# Patient Record
Sex: Female | Born: 1970 | Hispanic: No | Marital: Single | Smoking: Former smoker
Health system: Southern US, Community
[De-identification: ages and names within clinical notes are randomized; demographics above are authoritative.]

## PROBLEM LIST (undated history)

## (undated) DIAGNOSIS — M545 Low back pain, unspecified: Secondary | ICD-10-CM

## (undated) DIAGNOSIS — M47816 Spondylosis without myelopathy or radiculopathy, lumbar region: Secondary | ICD-10-CM

## (undated) DIAGNOSIS — F419 Anxiety disorder, unspecified: Secondary | ICD-10-CM

## (undated) DIAGNOSIS — M797 Fibromyalgia: Secondary | ICD-10-CM

## (undated) DIAGNOSIS — I5189 Other ill-defined heart diseases: Secondary | ICD-10-CM

## (undated) DIAGNOSIS — E559 Vitamin D deficiency, unspecified: Secondary | ICD-10-CM

## (undated) DIAGNOSIS — F513 Sleepwalking [somnambulism]: Secondary | ICD-10-CM

## (undated) DIAGNOSIS — N83209 Unspecified ovarian cyst, unspecified side: Secondary | ICD-10-CM

## (undated) DIAGNOSIS — D649 Anemia, unspecified: Secondary | ICD-10-CM

## (undated) DIAGNOSIS — F1911 Other psychoactive substance abuse, in remission: Secondary | ICD-10-CM

## (undated) DIAGNOSIS — I2510 Atherosclerotic heart disease of native coronary artery without angina pectoris: Secondary | ICD-10-CM

## (undated) DIAGNOSIS — G5600 Carpal tunnel syndrome, unspecified upper limb: Secondary | ICD-10-CM

## (undated) DIAGNOSIS — F329 Major depressive disorder, single episode, unspecified: Secondary | ICD-10-CM

## (undated) DIAGNOSIS — E782 Mixed hyperlipidemia: Secondary | ICD-10-CM

## (undated) DIAGNOSIS — M47812 Spondylosis without myelopathy or radiculopathy, cervical region: Secondary | ICD-10-CM

## (undated) DIAGNOSIS — G894 Chronic pain syndrome: Secondary | ICD-10-CM

## (undated) DIAGNOSIS — I10 Essential (primary) hypertension: Secondary | ICD-10-CM

## (undated) DIAGNOSIS — Z91410 Personal history of adult physical and sexual abuse: Secondary | ICD-10-CM

## (undated) DIAGNOSIS — Z87898 Personal history of other specified conditions: Secondary | ICD-10-CM

## (undated) DIAGNOSIS — F4310 Post-traumatic stress disorder, unspecified: Secondary | ICD-10-CM

## (undated) DIAGNOSIS — F32A Depression, unspecified: Secondary | ICD-10-CM

## (undated) DIAGNOSIS — G8929 Other chronic pain: Secondary | ICD-10-CM

## (undated) DIAGNOSIS — R7301 Impaired fasting glucose: Secondary | ICD-10-CM

## (undated) DIAGNOSIS — Z720 Tobacco use: Secondary | ICD-10-CM

## (undated) HISTORY — DX: Post-traumatic stress disorder, unspecified: F43.10

## (undated) HISTORY — PX: ABDOMINAL HYSTERECTOMY: SHX81

## (undated) HISTORY — DX: Low back pain, unspecified: M54.50

## (undated) HISTORY — DX: Mixed hyperlipidemia: E78.2

## (undated) HISTORY — DX: Carpal tunnel syndrome, unspecified upper limb: G56.00

## (undated) HISTORY — PX: MOUTH SURGERY: SHX715

## (undated) HISTORY — DX: Anemia, unspecified: D64.9

## (undated) HISTORY — DX: Vitamin D deficiency, unspecified: E55.9

## (undated) HISTORY — DX: Unspecified ovarian cyst, unspecified side: N83.209

## (undated) HISTORY — PX: CORONARY ANGIOPLASTY: SHX604

## (undated) HISTORY — DX: Personal history of adult physical and sexual abuse: Z91.410

## (undated) HISTORY — DX: Depression, unspecified: F32.A

## (undated) HISTORY — DX: Spondylosis without myelopathy or radiculopathy, lumbar region: M47.816

## (undated) HISTORY — DX: Low back pain: M54.5

## (undated) HISTORY — DX: Personal history of other specified conditions: Z87.898

## (undated) HISTORY — DX: Sleepwalking (somnambulism): F51.3

## (undated) HISTORY — DX: Spondylosis without myelopathy or radiculopathy, cervical region: M47.812

## (undated) HISTORY — DX: Other psychoactive substance abuse, in remission: F19.11

## (undated) HISTORY — DX: Impaired fasting glucose: R73.01

## (undated) HISTORY — DX: Anxiety disorder, unspecified: F41.9

## (undated) HISTORY — DX: Other chronic pain: G89.29

## (undated) HISTORY — DX: Chronic pain syndrome: G89.4

## (undated) HISTORY — DX: Major depressive disorder, single episode, unspecified: F32.9

---

## 2005-04-14 ENCOUNTER — Emergency Department (HOSPITAL_COMMUNITY): Admission: EM | Admit: 2005-04-14 | Discharge: 2005-04-15 | Payer: Self-pay | Admitting: Emergency Medicine

## 2005-11-20 ENCOUNTER — Emergency Department: Payer: Self-pay | Admitting: Internal Medicine

## 2009-08-23 ENCOUNTER — Emergency Department: Payer: Self-pay | Admitting: Emergency Medicine

## 2010-02-15 ENCOUNTER — Ambulatory Visit: Payer: Self-pay | Admitting: Unknown Physician Specialty

## 2010-03-09 ENCOUNTER — Ambulatory Visit: Payer: Self-pay | Admitting: Unknown Physician Specialty

## 2010-03-14 ENCOUNTER — Ambulatory Visit: Payer: Self-pay | Admitting: Unknown Physician Specialty

## 2011-12-03 DIAGNOSIS — T7840XA Allergy, unspecified, initial encounter: Secondary | ICD-10-CM

## 2011-12-03 HISTORY — DX: Allergy, unspecified, initial encounter: T78.40XA

## 2012-03-24 ENCOUNTER — Emergency Department: Payer: Self-pay | Admitting: Emergency Medicine

## 2012-03-24 LAB — COMPREHENSIVE METABOLIC PANEL WITH GFR
Albumin: 4.2 g/dL
Alkaline Phosphatase: 79 U/L
Anion Gap: 8
BUN: 19 mg/dL — ABNORMAL HIGH
Bilirubin,Total: 0.4 mg/dL
Calcium, Total: 8.9 mg/dL
Chloride: 106 mmol/L
Co2: 27 mmol/L
Creatinine: 0.88 mg/dL
EGFR (African American): >60
EGFR (Non-African Amer.): >60
Glucose: 79 mg/dL
Osmolality: 282
Potassium: 3.6 mmol/L
SGOT(AST): 26 U/L
SGPT (ALT): 29 U/L
Sodium: 141 mmol/L
Total Protein: 7.9 g/dL

## 2012-03-24 LAB — CBC
HCT: 40.9 %
HGB: 13.4 g/dL
MCH: 31.2 pg
MCHC: 32.7 g/dL
MCV: 95 fL
Platelet: 154 x10 3/mm 3
RBC: 4.29 X10 6/mm 3
RDW: 14.5 %
WBC: 5.9 x10 3/mm 3

## 2012-03-24 LAB — ETHANOL
Ethanol %: <0.003 %
Ethanol: <3 mg/dL

## 2012-03-25 LAB — HCG, QUANTITATIVE, PREGNANCY: Beta Hcg, Quant.: <1 m[IU]/mL — ABNORMAL LOW

## 2013-02-18 ENCOUNTER — Ambulatory Visit: Payer: Self-pay

## 2013-07-28 ENCOUNTER — Emergency Department: Payer: Self-pay | Admitting: Emergency Medicine

## 2013-08-17 ENCOUNTER — Emergency Department: Payer: Self-pay | Admitting: Internal Medicine

## 2013-08-17 LAB — COMPREHENSIVE METABOLIC PANEL
Alkaline Phosphatase: 111 U/L (ref 50–136)
Anion Gap: 5 — ABNORMAL LOW (ref 7–16)
Bilirubin,Total: 0.3 mg/dL (ref 0.2–1.0)
Co2: 29 mmol/L (ref 21–32)
Creatinine: 0.73 mg/dL (ref 0.60–1.30)
EGFR (African American): >60
Glucose: 95 mg/dL (ref 65–99)
Osmolality: 277 (ref 275–301)
Total Protein: 7.6 g/dL (ref 6.4–8.2)

## 2013-08-17 LAB — CBC WITH DIFFERENTIAL/PLATELET
Basophil #: 0.1 10*3/uL (ref 0.0–0.1)
Basophil %: 1.2 %
MCV: 90 fL (ref 80–100)
Monocyte %: 7.4 %
Neutrophil #: 3.6 10*3/uL (ref 1.4–6.5)
Neutrophil %: 58.2 %
Platelet: 272 10*3/uL (ref 150–440)
WBC: 6.1 10*3/uL (ref 3.6–11.0)

## 2013-08-17 LAB — SEDIMENTATION RATE: Erythrocyte Sed Rate: 28 mm/hr — ABNORMAL HIGH (ref 0–20)

## 2013-11-18 ENCOUNTER — Ambulatory Visit: Payer: Self-pay

## 2014-02-17 ENCOUNTER — Emergency Department: Payer: Self-pay | Admitting: Internal Medicine

## 2014-02-17 LAB — COMPREHENSIVE METABOLIC PANEL
ALK PHOS: 94 U/L
ANION GAP: 3 — AB (ref 7–16)
Albumin: 4.2 g/dL (ref 3.4–5.0)
BUN: 11 mg/dL (ref 7–18)
Bilirubin,Total: 0.5 mg/dL (ref 0.2–1.0)
CHLORIDE: 105 mmol/L (ref 98–107)
CREATININE: 0.78 mg/dL (ref 0.60–1.30)
Calcium, Total: 8.9 mg/dL (ref 8.5–10.1)
Co2: 27 mmol/L (ref 21–32)
Glucose: 84 mg/dL (ref 65–99)
Osmolality: 269 (ref 275–301)
Potassium: 3.8 mmol/L (ref 3.5–5.1)
SGOT(AST): 23 U/L (ref 15–37)
SGPT (ALT): 20 U/L (ref 12–78)
Sodium: 135 mmol/L — ABNORMAL LOW (ref 136–145)
Total Protein: 8.8 g/dL — ABNORMAL HIGH (ref 6.4–8.2)

## 2014-02-17 LAB — CBC WITH DIFFERENTIAL/PLATELET
BASOS PCT: 1.2 %
Basophil #: 0.1 10*3/uL (ref 0.0–0.1)
EOS ABS: 0.1 10*3/uL (ref 0.0–0.7)
EOS PCT: 1.1 %
HCT: 40.2 % (ref 35.0–47.0)
HGB: 13.4 g/dL (ref 12.0–16.0)
Lymphocyte #: 2.2 10*3/uL (ref 1.0–3.6)
Lymphocyte %: 32.6 %
MCH: 30.9 pg (ref 26.0–34.0)
MCHC: 33.2 g/dL (ref 32.0–36.0)
MCV: 93 fL (ref 80–100)
MONO ABS: 0.7 x10 3/mm (ref 0.2–0.9)
MONOS PCT: 10.6 %
NEUTROS ABS: 3.6 10*3/uL (ref 1.4–6.5)
NEUTROS PCT: 54.5 %
Platelet: 225 10*3/uL (ref 150–440)
RBC: 4.32 10*6/uL (ref 3.80–5.20)
RDW: 14.8 % — ABNORMAL HIGH (ref 11.5–14.5)
WBC: 6.6 10*3/uL (ref 3.6–11.0)

## 2014-02-17 LAB — SEDIMENTATION RATE: ERYTHROCYTE SED RATE: 21 mm/h — AB (ref 0–20)

## 2014-06-23 ENCOUNTER — Ambulatory Visit: Payer: Self-pay | Admitting: Family Medicine

## 2014-06-23 ENCOUNTER — Emergency Department: Payer: Self-pay | Admitting: Emergency Medicine

## 2014-12-17 ENCOUNTER — Emergency Department: Payer: Self-pay | Admitting: Physician Assistant

## 2015-05-31 ENCOUNTER — Other Ambulatory Visit: Payer: Self-pay

## 2015-05-31 ENCOUNTER — Emergency Department
Admission: EM | Admit: 2015-05-31 | Discharge: 2015-05-31 | Disposition: A | Discharge status: Home / Self Care | Payer: Self-pay | Attending: Emergency Medicine | Admitting: Emergency Medicine

## 2015-05-31 ENCOUNTER — Encounter: Payer: Self-pay | Admitting: Emergency Medicine

## 2015-05-31 DIAGNOSIS — G8929 Other chronic pain: Secondary | ICD-10-CM | POA: Insufficient documentation

## 2015-05-31 DIAGNOSIS — M7918 Myalgia, other site: Secondary | ICD-10-CM

## 2015-05-31 DIAGNOSIS — Y92410 Unspecified street and highway as the place of occurrence of the external cause: Secondary | ICD-10-CM | POA: Insufficient documentation

## 2015-05-31 DIAGNOSIS — Z720 Tobacco use: Secondary | ICD-10-CM | POA: Insufficient documentation

## 2015-05-31 DIAGNOSIS — Y9389 Activity, other specified: Secondary | ICD-10-CM | POA: Insufficient documentation

## 2015-05-31 DIAGNOSIS — Y998 Other external cause status: Secondary | ICD-10-CM | POA: Insufficient documentation

## 2015-05-31 DIAGNOSIS — S3992XA Unspecified injury of lower back, initial encounter: Secondary | ICD-10-CM | POA: Insufficient documentation

## 2015-05-31 DIAGNOSIS — Z79899 Other long term (current) drug therapy: Secondary | ICD-10-CM | POA: Insufficient documentation

## 2015-05-31 DIAGNOSIS — I10 Essential (primary) hypertension: Secondary | ICD-10-CM | POA: Insufficient documentation

## 2015-05-31 DIAGNOSIS — S199XXA Unspecified injury of neck, initial encounter: Secondary | ICD-10-CM | POA: Insufficient documentation

## 2015-05-31 HISTORY — DX: Fibromyalgia: M79.7

## 2015-05-31 HISTORY — DX: Essential (primary) hypertension: I10

## 2015-05-31 MED ORDER — NAPROXEN 250 MG PO TABS: 250.0000 mg | ORAL_TABLET | Freq: Two times a day (BID) | ORAL | Status: DC

## 2015-05-31 MED ORDER — OXYCODONE-ACETAMINOPHEN 5-325 MG PO TABS: 1.0000 | ORAL_TABLET | Freq: Four times a day (QID) | ORAL | Status: DC | PRN

## 2015-05-31 MED ORDER — DIAZEPAM 5 MG PO TABS: 5.0000 mg | ORAL_TABLET | Freq: Three times a day (TID) | ORAL | Status: DC | PRN
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
## 2015-05-31 NOTE — Discharge Instructions (Signed)
Fibromyalgia Fibromyalgia is a disorder that is often misunderstood. It is associated with muscular pains and tenderness that comes and goes. It is often associated with fatigue and sleep disturbances. Though it tends to be long-lasting, fibromyalgia is not life-threatening. CAUSES  The exact cause of fibromyalgia is unknown. People with certain gene types are predisposed to developing fibromyalgia and other conditions. Certain factors can play a role as triggers, such as:  Spine disorders.  Arthritis.  Severe injury (trauma) and other physical stressors.  Emotional stressors. SYMPTOMS   The main symptom is pain and stiffness in the muscles and joints, which can vary over time.  Sleep and fatigue problems. Other related symptoms may include:  Bowel and bladder problems.  Headaches.  Visual problems.  Problems with odors and noises.  Depression or mood changes.  Painful periods (dysmenorrhea).  Dryness of the skin or eyes. DIAGNOSIS  There are no specific tests for diagnosing fibromyalgia. Patients can be diagnosed accurately from the specific symptoms they have. The diagnosis is made by determining that nothing else is causing the problems. TREATMENT  There is no cure. Management includes medicines and an active, healthy lifestyle. The goal is to enhance physical fitness, decrease pain, and improve sleep. HOME CARE INSTRUCTIONS   Only take over-the-counter or prescription medicines as directed by your caregiver. Sleeping pills, tranquilizers, and pain medicines may make your problems worse.  Low-impact aerobic exercise is very important and advised for treatment. At first, it may seem to make pain worse. Gradually increasing your tolerance will overcome this feeling.  Learning relaxation techniques and how to control stress will help you. Biofeedback, visual imagery, hypnosis, muscle relaxation, yoga, and meditation are all options.  Anti-inflammatory medicines and  physical therapy may provide short-term help.  Acupuncture or massage treatments may help.  Take muscle relaxant medicines as suggested by your caregiver.  Avoid stressful situations.  Plan a healthy lifestyle. This includes your diet, sleep, rest, exercise, and friends.  Find and practice a hobby you enjoy.  Join a fibromyalgia support group for interaction, ideas, and sharing advice. This may be helpful. SEEK MEDICAL CARE IF:  You are not having good results or improvement from your treatment. FOR MORE INFORMATION  National Fibromyalgia Association: www.fmaware.org Arthritis Foundation: www.arthritis.org Document Released: 11/18/2005 Document Revised: 02/10/2012 Document Reviewed: 02/28/2010 [facility] Patient Information 2015 [place], [place]. This information is not intended to replace advice given to you by your health care provider. Make sure you discuss any questions you have with your health care provider.  Muscle Pain Muscle pain (myalgia) may be caused by many things, including:  Overuse or muscle strain, especially if you are not in shape. This is the most common cause of muscle pain.  Injury.  Bruises.  Viruses, such as the flu.  Infectious diseases.  Fibromyalgia, which is a chronic condition that causes muscle tenderness, fatigue, and headache.  Autoimmune diseases, including lupus.  Certain drugs, including ACE inhibitors and statins. Muscle pain may be mild or severe. In most cases, the pain lasts only a short time and goes away without treatment. To diagnose the cause of your muscle pain, your health care provider will take your medical history. This means he or she will ask you when your muscle pain began and what has been happening. If you have not had muscle pain for very long, your health care provider may want to wait before doing much testing. If your muscle pain has lasted a long time, your health care provider may want to run  tests right away. If your  health care provider thinks your muscle pain may be caused by illness, you may need to have additional tests to rule out certain conditions.  Treatment for muscle pain depends on the cause. Home care is often enough to relieve muscle pain. Your health care provider may also prescribe anti-inflammatory medicine. HOME CARE INSTRUCTIONS Watch your condition for any changes. The following actions may help to lessen any discomfort you are feeling:  Only take over-the-counter or prescription medicines as directed by your health care provider.  Apply ice to the sore muscle:  Put ice in a plastic bag.  Place a towel between your skin and the bag.  Leave the ice on for 15-20 minutes, 3-4 times a day.  You may alternate applying hot and cold packs to the muscle as directed by your health care provider.  If overuse is causing your muscle pain, slow down your activities until the pain goes away.  Remember that it is normal to feel some muscle pain after starting a workout program. Muscles that have not been used often will be sore at first.  Do regular, gentle exercises if you are not usually active.  Warm up before exercising to lower your risk of muscle pain.  Do not continue working out if the pain is very bad. Bad pain could mean you have injured a muscle. SEEK MEDICAL CARE IF:  Your muscle pain gets worse, and medicines do not help.  You have muscle pain that lasts longer than 3 days.  You have a rash or fever along with muscle pain.  You have muscle pain after a tick bite.  You have muscle pain while working out, even though you are in good physical condition.  You have redness, soreness, or swelling along with muscle pain.  You have muscle pain after starting a new medicine or changing the dose of a medicine. SEEK IMMEDIATE MEDICAL CARE IF:  You have trouble breathing.  You have trouble swallowing.  You have muscle pain along with a stiff neck, fever, and vomiting.  You  have severe muscle weakness or cannot move part of your body. MAKE SURE YOU:   Understand these instructions.  Will watch your condition.  Will get help right away if you are not doing well or get worse. Document Released: 10/10/2006 Document Revised: 11/23/2013 Document Reviewed: 09/14/2013 [facility] Patient Information 2015 [place], [place]. This information is not intended to replace advice given to you by your health care provider. Make sure you discuss any questions you have with your health care provider.   You were prescribed a medication that is potentially sedating. Do not drink alcohol, drive or participate in any other potentially dangerous activities while taking this medication as it may make you sleepy. Do not take this medication with any other sedating medications, either prescription or over-the-counter. If you were prescribed Percocet or Vicodin, do not take these with acetaminophen (Tylenol) as it is already contained within these medications.   Opioid pain medications (or "narcotics") can be habit forming.  Use it as little as possible to achieve adequate pain control.  Do not use or use it with extreme caution if you have a history of opiate abuse or dependence.  If you are on a pain contract with your primary care doctor or a pain specialist, be sure to let them know you were prescribed this medication today from the [facility] Emergency Department.  This medication is intended for your use only - do not  give any to anyone else and keep it in a secure place where nobody else, especially children and pets, have access to it.  It will also cause or worsen constipation, so you may want to consider taking an over-the-counter stool softener while you are taking this medication.

## 2015-05-31 NOTE — ED Notes (Signed)
Pt to ED with c/o weakness all over and numbness and weakness to left arm, states she has had 2 episodes of syncope since Saturday, states she has fibromyalgia and she thinks this has something to do with it, also c/o pain all over her body

## 2015-08-02 ENCOUNTER — Emergency Department
Admission: EM | Admit: 2015-08-02 | Discharge: 2015-08-02 | Disposition: A | Discharge status: Home / Self Care | Payer: Medicaid Other | Attending: Emergency Medicine | Admitting: Emergency Medicine

## 2015-08-02 ENCOUNTER — Encounter: Payer: Self-pay | Admitting: Emergency Medicine

## 2015-08-02 DIAGNOSIS — M549 Dorsalgia, unspecified: Secondary | ICD-10-CM | POA: Diagnosis present

## 2015-08-02 DIAGNOSIS — Z87891 Personal history of nicotine dependence: Secondary | ICD-10-CM | POA: Insufficient documentation

## 2015-08-02 DIAGNOSIS — Z79899 Other long term (current) drug therapy: Secondary | ICD-10-CM | POA: Diagnosis not present

## 2015-08-02 DIAGNOSIS — I10 Essential (primary) hypertension: Secondary | ICD-10-CM | POA: Diagnosis not present

## 2015-08-02 DIAGNOSIS — M797 Fibromyalgia: Secondary | ICD-10-CM

## 2015-08-02 MED ORDER — DIAZEPAM 5 MG/ML IJ SOLN
5.0000 mg | Freq: Once | INTRAMUSCULAR | Status: DC
  Filled 2015-08-02: qty 2

## 2015-08-02 MED ORDER — KETOROLAC TROMETHAMINE 60 MG/2ML IM SOLN
60.0000 mg | Freq: Once | INTRAMUSCULAR | Status: AC
  Administered 2015-08-02: 60 mg via INTRAMUSCULAR
  Filled 2015-08-02: qty 2

## 2015-08-02 MED ORDER — OXYCODONE-ACETAMINOPHEN 5-325 MG PO TABS: 1.0000 | ORAL_TABLET | ORAL | Status: DC | PRN

## 2015-08-02 MED ORDER — DIAZEPAM 5 MG/ML IJ SOLN
5.0000 mg | Freq: Once | INTRAMUSCULAR | Status: AC
  Administered 2015-08-02: 5 mg via INTRAMUSCULAR

## 2015-08-02 NOTE — ED Provider Notes (Signed)
[facility]  Time seen: Approximately 12:37 PM  I have reviewed the triage vital signs and the nursing notes.   HISTORY  Chief Complaint Back Pain and Fibromyalgia    HPI ARLETT GOOLD is a 44 y.o. female with ongoing history of fibromyalgia. Presents today with an acute exacerbation. Patient states she is currently on the Lyrica, for pain.   Past Medical History  Diagnosis Date  . Fibromyalgia   . Hypertension     There are no active problems to display for this patient.   History reviewed. No pertinent past surgical history.  Current Outpatient Rx  Name  Route  Sig  Dispense  Refill  . gabapentin (NEURONTIN) 300 MG capsule   Oral   Take 300 mg by mouth 3 (three) times daily.         . pregabalin (LYRICA) 75 MG capsule   Oral   Take 150 mg by mouth 3 (three) times daily.         Marland Kitchen FLUoxetine (PROZAC) 20 MG capsule   Oral   Take 20 mg by mouth daily.         Marland Kitchen lisinopril-hydrochlorothiazide (PRINZIDE,ZESTORETIC) 10-12.5 MG per tablet   Oral   Take 1 tablet by mouth daily.         Marland Kitchen oxyCODONE-acetaminophen (ROXICET) 5-325 MG per tablet   Oral   Take 1-2 tablets by mouth every 4 (four) hours as needed for severe pain.   15 tablet   0     Allergies Review of patient's allergies indicates no known allergies.  No family history on file.  Social History Social History  Substance Use Topics  . Smoking status: Former Games developer  . Smokeless tobacco: None  . Alcohol Use: No    Review of Systems Constitutional: No fever/chills Eyes: No visual changes. ENT: No sore throat. Cardiovascular: Denies chest pain. Respiratory: Denies shortness of breath. Gastrointestinal: No abdominal pain.  No nausea, no vomiting.  No diarrhea.  No constipation. Genitourinary: Negative for dysuria. Musculoskeletal: Positive for spinal pain and shoulder pain and  hand pain. Skin: Negative for rash. Neurological: Negative for headaches, focal weakness or numbness.  10-point ROS otherwise negative.  ____________________________________________   PHYSICAL EXAM:  VITAL SIGNS: ED Triage Vitals  Enc Vitals Group     BP 08/02/15 1214 113/96 mmHg     Pulse Rate 08/02/15 1214 86     Resp 08/02/15 1214 18     Temp 08/02/15 1214 97.6 F (36.4 C)     Temp Source 08/02/15 1214 Oral     SpO2 08/02/15 1214 95 %     Weight 08/02/15 1214 180 lb (81.647 kg)     Height --      Head Cir --      Peak Flow --      Pain Score 08/02/15 1216 10     Pain Loc --      Pain Edu? --      Excl. in GC? --     Constitutional: Alert and oriented. Well appearing and in no acute distress. Eyes: Conjunctivae are normal. PERRL. EOMI. Head: Atraumatic. Nose: No congestion/rhinnorhea. Mouth/Throat: Mucous membranes are moist.  Oropharynx non-erythematous. Neck: No stridor.  Positive spinal tenderness. Range of motion of the neck with flexion extension. Cardiovascular: Normal rate, regular rhythm. Grossly normal heart sounds.  Good peripheral circulation. Respiratory: Normal respiratory effort.  No retractions. Lungs CTAB. Gastrointestinal: Soft and nontender. No distention. No abdominal  bruits. No CVA tenderness. Musculoskeletal: No lower extremity tenderness nor edema.  No joint effusions. Positive muscular spasms noted on the left shoulder. Neurologic:  Normal speech and language. No gross focal neurologic deficits are appreciated. No gait instability. Skin:  Skin is warm, dry and intact. No rash noted. Psychiatric: Mood and affect are normal. Speech and behavior are normal.  ____________________________________________   LABS (all labs ordered are listed, but only abnormal results are displayed)  Labs Reviewed - No data to display ____________________________________________   PROCEDURES  Procedure(s) performed: None  Critical Care performed:  No  ____________________________________________   INITIAL IMPRESSION / ASSESSMENT AND PLAN / ED COURSE  Pertinent labs & imaging results that were available during my care of the patient were reviewed by me and considered in my medical decision making (see chart for details).  Acute exacerbation of fibromyalgia. 5 mg and Toradol 60 mg IM given while in the ED. Patient encouraged to follow up with her PCP for further intervention. Rx given for 2 day course of oxycodone 5/325. Patient voices some relief while in ED. She offers no other emergency medical complaints at this time. ____________________________________________   FINAL CLINICAL IMPRESSION(S) / ED DIAGNOSES  Final diagnoses:  Muscle pain, fibromyalgia      Evangeline Dakin, PA-C 08/02/15 1302  Sharyn Creamer, MD 08/02/15 [phone]
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
## 2015-08-02 NOTE — Discharge Instructions (Signed)
Fibromyalgia Fibromyalgia is a disorder that is often misunderstood. It is associated with muscular pains and tenderness that comes and goes. It is often associated with fatigue and sleep disturbances. Though it tends to be long-lasting, fibromyalgia is not life-threatening. CAUSES  The exact cause of fibromyalgia is unknown. People with certain gene types are predisposed to developing fibromyalgia and other conditions. Certain factors can play a role as triggers, such as:  Spine disorders.  Arthritis.  Severe injury (trauma) and other physical stressors.  Emotional stressors. SYMPTOMS   The main symptom is pain and stiffness in the muscles and joints, which can vary over time.  Sleep and fatigue problems. Other related symptoms may include:  Bowel and bladder problems.  Headaches.  Visual problems.  Problems with odors and noises.  Depression or mood changes.  Painful periods (dysmenorrhea).  Dryness of the skin or eyes. DIAGNOSIS  There are no specific tests for diagnosing fibromyalgia. Patients can be diagnosed accurately from the specific symptoms they have. The diagnosis is made by determining that nothing else is causing the problems. TREATMENT  There is no cure. Management includes medicines and an active, healthy lifestyle. The goal is to enhance physical fitness, decrease pain, and improve sleep. HOME CARE INSTRUCTIONS   Only take over-the-counter or prescription medicines as directed by your caregiver. Sleeping pills, tranquilizers, and pain medicines may make your problems worse.  Low-impact aerobic exercise is very important and advised for treatment. At first, it may seem to make pain worse. Gradually increasing your tolerance will overcome this feeling.  Learning relaxation techniques and how to control stress will help you. Biofeedback, visual imagery, hypnosis, muscle relaxation, yoga, and meditation are all options.  Anti-inflammatory medicines and  physical therapy may provide short-term help.  Acupuncture or massage treatments may help.  Take muscle relaxant medicines as suggested by your caregiver.  Avoid stressful situations.  Plan a healthy lifestyle. This includes your diet, sleep, rest, exercise, and friends.  Find and practice a hobby you enjoy.  Join a fibromyalgia support group for interaction, ideas, and sharing advice. This may be helpful. SEEK MEDICAL CARE IF:  You are not having good results or improvement from your treatment. FOR MORE INFORMATION  National Fibromyalgia Association: www.fmaware.org Arthritis Foundation: www.arthritis.org Document Released: 11/18/2005 Document Revised: 02/10/2012 Document Reviewed: 02/28/2010 [employer] Patient Information 2015 [employer]. This information is not intended to replace advice given to you by your health care provider. Make sure you discuss any questions you have with your health care provider.  

## 2015-09-15 ENCOUNTER — Emergency Department
Admission: EM | Admit: 2015-09-15 | Discharge: 2015-09-15 | Disposition: A | Discharge status: Home / Self Care | Payer: Medicaid Other | Attending: Emergency Medicine | Admitting: Emergency Medicine

## 2015-09-15 DIAGNOSIS — E860 Dehydration: Secondary | ICD-10-CM | POA: Diagnosis not present

## 2015-09-15 DIAGNOSIS — M62830 Muscle spasm of back: Secondary | ICD-10-CM

## 2015-09-15 DIAGNOSIS — Z87891 Personal history of nicotine dependence: Secondary | ICD-10-CM | POA: Diagnosis not present

## 2015-09-15 DIAGNOSIS — Z79899 Other long term (current) drug therapy: Secondary | ICD-10-CM | POA: Diagnosis not present

## 2015-09-15 DIAGNOSIS — M545 Low back pain: Secondary | ICD-10-CM | POA: Diagnosis present

## 2015-09-15 DIAGNOSIS — I10 Essential (primary) hypertension: Secondary | ICD-10-CM | POA: Insufficient documentation

## 2015-09-15 DIAGNOSIS — M797 Fibromyalgia: Secondary | ICD-10-CM | POA: Diagnosis not present

## 2015-09-15 DIAGNOSIS — M62838 Other muscle spasm: Secondary | ICD-10-CM | POA: Diagnosis not present

## 2015-09-15 MED ORDER — CYCLOBENZAPRINE HCL 10 MG PO TABS: 10.0000 mg | ORAL_TABLET | Freq: Three times a day (TID) | ORAL | Status: DC | PRN

## 2015-09-15 MED ORDER — MELOXICAM 15 MG PO TABS: 15.0000 mg | ORAL_TABLET | Freq: Every day | ORAL | Status: DC

## 2015-09-15 MED ORDER — KETOROLAC TROMETHAMINE 30 MG/ML IJ SOLN
60.0000 mg | Freq: Once | INTRAMUSCULAR | Status: AC
Start: 2015-09-15 — End: 2015-09-15
  Administered 2015-09-15: 60 mg via INTRAMUSCULAR
  Filled 2015-09-15: qty 2

## 2015-09-15 NOTE — ED Notes (Signed)
States she has a hx of fibromyalgia now having increased pain to neck back,left hip and leg areas. With some swelling. Pt is tearful and state pain is unbearable

## 2015-09-15 NOTE — ED Provider Notes (Signed)
[facility] Emergency Department Provider Note  ____________________________________________  Time seen: Approximately 10:01 AM  I have reviewed the triage vital signs and the nursing notes.   HISTORY  Chief Complaint Pain    HPI Susan Macias is a 44 y.o. female who presents to the emergency department complaining of back pain. She states that she has a history of fibromyalgia and now is having increased pain. She states that the pain is diffuse and there is not in relation to an injury. She denies any numbness or tingling in her lower extremities. She denies any loss of function. She says the pain is best described as ache/cramp sensation. She states that the pain starts in her shoulder area and radiates all way through to her lower back. Pain is moderate to severe. She states that it is worse with movement. She has not taken any medications prior to arrival for same.   Past Medical History  Diagnosis Date  . Fibromyalgia   . Hypertension     There are no active problems to display for this patient.   Past Surgical History  Procedure Laterality Date  . Abdominal hysterectomy      Current Outpatient Rx  Name  Route  Sig  Dispense  Refill  . FLUoxetine (PROZAC) 20 MG capsule   Oral   Take 20 mg by mouth daily.         Marland Kitchen gabapentin (NEURONTIN) 300 MG capsule   Oral   Take 300 mg by mouth 3 (three) times daily.         Marland Kitchen lisinopril-hydrochlorothiazide (PRINZIDE,ZESTORETIC) 10-12.5 MG per tablet   Oral   Take 1 tablet by mouth daily.         Marland Kitchen oxyCODONE-acetaminophen (ROXICET) 5-325 MG per tablet   Oral   Take 1-2 tablets by mouth every 4 (four) hours as needed for severe pain.   15 tablet   0   . pregabalin (LYRICA) 75 MG capsule   Oral   Take 150 mg by mouth 3 (three) times daily.           Allergies Review of patient's allergies indicates no known allergies.  No family history on file.  Social History Social History   Substance Use Topics  . Smoking status: Former Games developer  . Smokeless tobacco: None  . Alcohol Use: No    Review of Systems Constitutional: No fever/chills Eyes: No visual changes. ENT: No sore throat. Cardiovascular: Denies chest pain. Respiratory: Denies shortness of breath. Gastrointestinal: No abdominal pain.  No nausea, no vomiting.  No diarrhea.  No constipation. Genitourinary: Negative for dysuria. Musculoskeletal: Endorses diffuse back pain.. Skin: Negative for rash. Neurological: Negative for headaches, focal weakness or numbness.  10-point ROS otherwise negative.  ____________________________________________   PHYSICAL EXAM:  VITAL SIGNS: ED Triage Vitals  Enc Vitals Group     BP 09/15/15 0952 119/92 mmHg     Pulse Rate 09/15/15 0952 85     Resp 09/15/15 0952 17     Temp 09/15/15 0952 97.6 F (36.4 C)     Temp Source 09/15/15 0952 Oral     SpO2 09/15/15 0952 98 %     Weight 09/15/15 0952 180 lb (81.647 kg)     Height 09/15/15 0952 5\' 9"  (1.753 m)     Head Cir --      Peak Flow --      Pain Score 09/15/15 0953 10     Pain Loc --  Pain Edu? --      Excl. in GC? --     Constitutional: Alert and oriented. Well appearing and in no acute distress. Eyes: Conjunctivae are normal. PERRL. EOMI. Head: Atraumatic. Nose: No congestion/rhinnorhea. Mouth/Throat: Mucous membranes are moist.  Oropharynx non-erythematous. Neck: No stridor.  No cervical spine tenderness to palpation. Cardiovascular: Normal rate, regular rhythm. Grossly normal heart sounds.  Good peripheral circulation. Respiratory: Normal respiratory effort.  No retractions. Lungs CTAB. Gastrointestinal: Soft and nontender. No distention. No abdominal bruits. No CVA tenderness. Musculoskeletal: No lower extremity tenderness nor edema.  No joint effusions. No visible deformity to back. Patient is diffusely tender to palpation over lower cervical paraspinal muscles, thoracic paraspinal muscles, and  lumbar paraspinal muscles. There is muscular spasms noted on the left cervical and left thoracic paraspinal muscles as well as the left shoulder girdle. Equal grip strength. Sensation intact distally. Cap refill less than 2 seconds. Neurologic:  Normal speech and language. No gross focal neurologic deficits are appreciated. No gait instability. Skin:  Skin is warm, dry and intact. No rash noted. Psychiatric: Mood and affect are normal. Speech and behavior are normal.  ____________________________________________   LABS (all labs ordered are listed, but only abnormal results are displayed)  Labs Reviewed - No data to display ____________________________________________  EKG   ____________________________________________  RADIOLOGY   ____________________________________________   PROCEDURES  Procedure(s) performed: None  Critical Care performed: No  ____________________________________________   INITIAL IMPRESSION / ASSESSMENT AND PLAN / ED COURSE  Pertinent labs & imaging results that were available during my care of the patient were reviewed by me and considered in my medical decision making (see chart for details).  The patient is a 45 year old female who presents emergency Department with diffuse back pain. Patient's history, symptoms, physical exam are consistent with muscular spasms to left side cervical and thoracic paraspinal muscles. The patient endorses ongoing drinking 3-4 bottles of water week. So his complaint is likely secondary to mild dehydration. This condition is aggravating her diagnosis of fibromyalgia. Findings and diagnosis with patient. She verbalizes understanding of same. Advised the patient to increase her oral intake of fluids at home, take a few days worth of muscle relaxers, and start anti-inflammatories. The patient verbalizes understanding and compliance with treatment plan. ____________________________________________   FINAL CLINICAL  IMPRESSION(S) / ED DIAGNOSES  Final diagnoses:  Spasm of paraspinal muscle  Mild dehydration  Fibromyalgia      Racheal Patches, PA-C 09/15/15 1035  Minna Antis, MD 09/15/15 [phone]

## 2015-09-15 NOTE — ED Notes (Signed)
Pt c/o flare up with chronic pain in lower back in the past couple of days..states she takes lyric and gabapentin for it.

## 2015-09-15 NOTE — Discharge Instructions (Signed)
Back Exercises °The following exercises strengthen the muscles that help to support the back. They also help to keep the lower back flexible. Doing these exercises can help to prevent back pain or lessen existing pain. °If you have back pain or discomfort, try doing these exercises 2-3 times each day or as told by your health care provider. When the pain goes away, do them once each day, but increase the number of times that you repeat the steps for each exercise (do more repetitions). If you do not have back pain or discomfort, do these exercises once each day or as told by your health care provider. °EXERCISES °Single Knee to Chest °Repeat these steps 3-5 times for each leg: °· Lie on your back on a firm bed or the floor with your legs extended. °· Bring one knee to your chest. Your other leg should stay extended and in contact with the floor. °· Hold your knee in place by grabbing your knee or thigh. °· Pull on your knee until you feel a gentle stretch in your lower back. °· Hold the stretch for 10-30 seconds. °· Slowly release and straighten your leg. °Pelvic Tilt °Repeat these steps 5-10 times: °· Lie on your back on a firm bed or the floor with your legs extended. °· Bend your knees so they are pointing toward the ceiling and your feet are flat on the floor. °· Tighten your lower abdominal muscles to press your lower back against the floor. This motion will tilt your pelvis so your tailbone points up toward the ceiling instead of pointing to your feet or the floor. °· With gentle tension and even breathing, hold this position for 5-10 seconds. °Cat-Cow °Repeat these steps until your lower back becomes more flexible: °· Get into a hands-and-knees position on a firm surface. Keep your hands under your shoulders, and keep your knees under your hips. You may place padding under your knees for comfort. °· Let your head hang down, and point your tailbone toward the floor so your lower back becomes rounded like the  back of a cat. °· Hold this position for 5 seconds. °· Slowly lift your head and point your tailbone up toward the ceiling so your back forms a sagging arch like the back of a cow. °· Hold this position for 5 seconds. °Press-Ups °Repeat these steps 5-10 times: °· Lie on your abdomen (face-down) on the floor. °· Place your palms near your head, about shoulder-width apart. °· While you keep your back as relaxed as possible and keep your hips on the floor, slowly straighten your arms to raise the top half of your body and lift your shoulders. Do not use your back muscles to raise your upper torso. You may adjust the placement of your hands to make yourself more comfortable. °· Hold this position for 5 seconds while you keep your back relaxed. °· Slowly return to lying flat on the floor. °Bridges °Repeat these steps 10 times: °· Lie on your back on a firm surface. °· Bend your knees so they are pointing toward the ceiling and your feet are flat on the floor. °· Tighten your buttocks muscles and lift your buttocks off of the floor until your waist is at almost the same height as your knees. You should feel the muscles working in your buttocks and the back of your thighs. If you do not feel these muscles, slide your feet 1-2 inches farther away from your buttocks. °· Hold this position for 3-5   seconds.  Slowly lower your hips to the starting position, and allow your buttocks muscles to relax completely. If this exercise is too easy, try doing it with your arms crossed over your chest. Abdominal Crunches Repeat these steps 5-10 times:  Lie on your back on a firm bed or the floor with your legs extended.  Bend your knees so they are pointing toward the ceiling and your feet are flat on the floor.  Cross your arms over your chest.  Tip your chin slightly toward your chest without bending your neck.  Tighten your abdominal muscles and slowly raise your trunk (torso) high enough to lift your shoulder blades a  tiny bit off of the floor. Avoid raising your torso higher than that, because it can put too much stress on your low back and it does not help to strengthen your abdominal muscles.  Slowly return to your starting position. Back Lifts Repeat these steps 5-10 times:  Lie on your abdomen (face-down) with your arms at your sides, and rest your forehead on the floor.  Tighten the muscles in your legs and your buttocks.  Slowly lift your chest off of the floor while you keep your hips pressed to the floor. Keep the back of your head in line with the curve in your back. Your eyes should be looking at the floor.  Hold this position for 3-5 seconds.  Slowly return to your starting position. SEEK MEDICAL CARE IF:  Your back pain or discomfort gets much worse when you do an exercise.  Your back pain or discomfort does not lessen within 2 hours after you exercise. If you have any of these problems, stop doing these exercises right away. Do not do them again unless your health care provider says that you can. SEEK IMMEDIATE MEDICAL CARE IF:  You develop sudden, severe back pain. If this happens, stop doing the exercises right away. Do not do them again unless your health care provider says that you can.   This information is not intended to replace advice given to you by your health care provider. Make sure you discuss any questions you have with your health care provider.   Document Released: 12/26/2004 Document Revised: 08/09/2015 Document Reviewed: 01/12/2015 Elsevier Interactive Patient Education 2016 [employer]  Dehydration, Adult Dehydration is a condition in which you do not have enough fluid or water in your body. It happens when you take in less fluid than you lose. Vital organs such as the kidneys, brain, and heart cannot function without a proper amount of fluids. Any loss of fluids from the body can cause dehydration.  Dehydration can range from mild to severe. This condition  should be treated right away to help prevent it from becoming severe. CAUSES  This condition may be caused by:  Vomiting.  Diarrhea.  Excessive sweating, such as when exercising in hot or humid weather.  Not drinking enough fluid during strenuous exercise or during an illness.  Excessive urine output.  Fever.  Certain medicines. RISK FACTORS This condition is more likely to develop in:  People who are taking certain medicines that cause the body to lose excess fluid (diuretics).   People who have a chronic illness, such as diabetes, that may increase urination.  Older adults.   People who live at high altitudes.   People who participate in endurance sports.  SYMPTOMS  Mild Dehydration  Thirst.  Dry lips.  Slightly dry mouth.  Dry, warm skin. Moderate Dehydration  Very dry mouth.  Muscle cramps.   Dark urine and decreased urine production.   Decreased tear production.   Headache.   Light-headedness, especially when you stand up from a sitting position.  Severe Dehydration  Changes in skin.   Cold and clammy skin.   Skin does not spring back quickly when lightly pinched and released.   Changes in body fluids.   Extreme thirst.   No tears.   Not able to sweat when body temperature is high, such as in hot weather.   Minimal urine production.   Changes in vital signs.   Rapid, weak pulse (more than 100 beats per minute when you are sitting still).   Rapid breathing.   Low blood pressure.   Other changes.   Sunken eyes.   Cold hands and feet.   Confusion.  Lethargy and difficulty being awakened.  Fainting (syncope).   Short-term weight loss.   Unconsciousness. DIAGNOSIS  This condition may be diagnosed based on your symptoms. You may also have tests to determine how severe your dehydration is. These tests may include:   Urine tests.   Blood tests.  TREATMENT  Treatment for this condition  depends on the severity. Mild or moderate dehydration can often be treated at home. Treatment should be started right away. Do not wait until dehydration becomes severe. Severe dehydration needs to be treated at the hospital. Treatment for Mild Dehydration  Drinking plenty of water to replace the fluid you have lost.   Replacing minerals in your blood (electrolytes) that you may have lost.  Treatment for Moderate Dehydration  Consuming oral rehydration solution (ORS). Treatment for Severe Dehydration  Receiving fluid through an IV tube.   Receiving electrolyte solution through a feeding tube that is passed through your nose and into your stomach (nasogastric tube or NG tube).  Correcting any abnormalities in electrolytes. HOME CARE INSTRUCTIONS   Drink enough fluid to keep your urine clear or pale yellow.   Drink water or fluid slowly by taking small sips. You can also try sucking on ice cubes.  Have food or beverages that contain electrolytes. Examples include bananas and sports drinks.  Take over-the-counter and prescription medicines only as told by your health care provider.   Prepare ORS according to the manufacturer's instructions. Take sips of ORS every 5 minutes until your urine returns to normal.  If you have vomiting or diarrhea, continue to try to drink water, ORS, or both.   If you have diarrhea, avoid:   Beverages that contain caffeine.   Fruit juice.   Milk.   Carbonated soft drinks.  Do not take salt tablets. This can lead to the condition of having too much sodium in your body (hypernatremia).  SEEK MEDICAL CARE IF:  You cannot eat or drink without vomiting.  You have had moderate diarrhea during a period of more than 24 hours.  You have a fever. SEEK IMMEDIATE MEDICAL CARE IF:   You have extreme thirst.  You have severe diarrhea.  You have not urinated in 6-8 hours, or you have urinated only a small amount of very dark urine.  You  have shriveled skin.  You are dizzy, confused, or both.   This information is not intended to replace advice given to you by your health care provider. Make sure you discuss any questions you have with your health care provider.   Document Released: 11/18/2005 Document Revised: 08/09/2015 Document Reviewed: 04/05/2015 Elsevier Interactive Patient Education 2016 [employer]  Muscle Cramps and Spasms Muscle cramps  and spasms occur when a muscle or muscles tighten and you have no control over this tightening (involuntary muscle contraction). They are a common problem and can develop in any muscle. The most common place is in the calf muscles of the leg. Both muscle cramps and muscle spasms are involuntary muscle contractions, but they also have differences:   Muscle cramps are sporadic and painful. They may last a few seconds to a quarter of an hour. Muscle cramps are often more forceful and last longer than muscle spasms.  Muscle spasms may or may not be painful. They may also last just a few seconds or much longer. CAUSES  It is uncommon for cramps or spasms to be due to a serious underlying problem. In many cases, the cause of cramps or spasms is unknown. Some common causes are:   Overexertion.   Overuse from repetitive motions (doing the same thing over and over).   Remaining in a certain position for a long period of time.   Improper preparation, form, or technique while performing a sport or activity.   Dehydration.   Injury.   Side effects of some medicines.   Abnormally low levels of the salts and ions in your blood (electrolytes), especially potassium and calcium. This could happen if you are taking water pills (diuretics) or you are pregnant.  Some underlying medical problems can make it more likely to develop cramps or spasms. These include, but are not limited to:   Diabetes.   Parkinson disease.   Hormone disorders, such as thyroid problems.   Alcohol  abuse.   Diseases specific to muscles, joints, and bones.   Blood vessel disease where not enough blood is getting to the muscles.  HOME CARE INSTRUCTIONS   Stay well hydrated. Drink enough water and fluids to keep your urine clear or pale yellow.  It may be helpful to massage, stretch, and relax the affected muscle.  For tight or tense muscles, use a warm towel, heating pad, or hot shower water directed to the affected area.  If you are sore or have pain after a cramp or spasm, applying ice to the affected area may relieve discomfort.  Put ice in a plastic bag.  Place a towel between your skin and the bag.  Leave the ice on for 15-20 minutes, 03-04 times a day.  Medicines used to treat a known cause of cramps or spasms may help reduce their frequency or severity. Only take over-the-counter or prescription medicines as directed by your caregiver. SEEK MEDICAL CARE IF:  Your cramps or spasms get more severe, more frequent, or do not improve over time.  MAKE SURE YOU:   Understand these instructions.  Will watch your condition.  Will get help right away if you are not doing well or get worse.   This information is not intended to replace advice given to you by your health care provider. Make sure you discuss any questions you have with your health care provider.   Document Released: 05/10/2002 Document Revised: 03/15/2013 Document Reviewed: 11/04/2012 Elsevier Interactive Patient Education [employer]

## 2015-10-22 ENCOUNTER — Encounter: Payer: Self-pay | Admitting: Emergency Medicine

## 2015-10-22 ENCOUNTER — Emergency Department
Admission: EM | Admit: 2015-10-22 | Discharge: 2015-10-22 | Disposition: A | Discharge status: Home / Self Care | Payer: Medicaid Other | Attending: Emergency Medicine | Admitting: Emergency Medicine

## 2015-10-22 DIAGNOSIS — Z87891 Personal history of nicotine dependence: Secondary | ICD-10-CM | POA: Diagnosis not present

## 2015-10-22 DIAGNOSIS — M25512 Pain in left shoulder: Secondary | ICD-10-CM | POA: Diagnosis present

## 2015-10-22 DIAGNOSIS — I10 Essential (primary) hypertension: Secondary | ICD-10-CM | POA: Diagnosis not present

## 2015-10-22 DIAGNOSIS — M797 Fibromyalgia: Secondary | ICD-10-CM | POA: Diagnosis not present

## 2015-10-22 DIAGNOSIS — Z79899 Other long term (current) drug therapy: Secondary | ICD-10-CM | POA: Diagnosis not present

## 2015-10-22 MED ORDER — DIAZEPAM 5 MG/ML IJ SOLN
5.0000 mg | Freq: Once | INTRAMUSCULAR | Status: AC
  Administered 2015-10-22: 5 mg via INTRAMUSCULAR
  Filled 2015-10-22: qty 2

## 2015-10-22 MED ORDER — DICLOFENAC SODIUM 75 MG PO TBEC: 75.0000 mg | DELAYED_RELEASE_TABLET | Freq: Two times a day (BID) | ORAL | Status: DC

## 2015-10-22 MED ORDER — METHOCARBAMOL 500 MG PO TABS: 500.0000 mg | ORAL_TABLET | Freq: Four times a day (QID) | ORAL | Status: DC | PRN

## 2015-10-22 MED ORDER — OXYCODONE-ACETAMINOPHEN 5-325 MG PO TABS: 1.0000 | ORAL_TABLET | ORAL | Status: DC | PRN

## 2015-10-22 MED ORDER — KETOROLAC TROMETHAMINE 60 MG/2ML IM SOLN
60.0000 mg | Freq: Once | INTRAMUSCULAR | Status: AC
  Administered 2015-10-22: 60 mg via INTRAMUSCULAR
  Filled 2015-10-22: qty 2

## 2015-10-22 NOTE — ED Notes (Addendum)
States she is having pain from left side of neck into left arm and back min swelling noted to left shoulder area. States she has fibromyalgia and her current meds are not working

## 2015-10-22 NOTE — ED Notes (Signed)
Pt states hx of fibromyalgia, states she is having a flare up that is left sided arm pain starting in her neck and radiating downward to her back, pt in no acute distress

## 2015-10-22 NOTE — ED Provider Notes (Signed)
[facility] Emergency Department Provider Note  ____________________________________________  Time seen: Approximately 10:47 AM  I have reviewed the triage vital signs and the nursing notes.   HISTORY  Chief Complaint Arm Pain   HPI Susan Macias is a 44 y.o. female who presents to the emergency room for evaluation of her fibromyalgia breakthrough. Patient states that she is currently being treated with usually good control with Lyrica by her PCP. However since was return called this morning and last night that she is having a flareup in her left shoulder. Denies any trauma or other injuries at this time. She states that the pain radiates from the left side of her neck into her left shoulder and arm and to the lower left scapular area.   Past Medical History  Diagnosis Date  . Fibromyalgia   . Hypertension     There are no active problems to display for this patient.   Past Surgical History  Procedure Laterality Date  . Abdominal hysterectomy      Current Outpatient Rx  Name  Route  Sig  Dispense  Refill  . diclofenac (VOLTAREN) 75 MG EC tablet   Oral   Take 1 tablet (75 mg total) by mouth 2 (two) times daily.   60 tablet   0   . FLUoxetine (PROZAC) 20 MG capsule   Oral   Take 20 mg by mouth daily.         Marland Kitchen gabapentin (NEURONTIN) 300 MG capsule   Oral   Take 300 mg by mouth 3 (three) times daily.         Marland Kitchen lisinopril-hydrochlorothiazide (PRINZIDE,ZESTORETIC) 10-12.5 MG per tablet   Oral   Take 1 tablet by mouth daily.         . methocarbamol (ROBAXIN) 500 MG tablet   Oral   Take 1 tablet (500 mg total) by mouth every 6 (six) hours as needed for muscle spasms.   30 tablet   0   . oxyCODONE-acetaminophen (ROXICET) 5-325 MG tablet   Oral   Take 1-2 tablets by mouth every 4 (four) hours as needed for severe pain.   8 tablet   0   . pregabalin (LYRICA) 75 MG capsule   Oral   Take 150 mg by mouth 3 (three) times daily.           Allergies Review of patient's allergies indicates no known allergies.  No family history on file.  Social History Social History  Substance Use Topics  . Smoking status: Former Games developer  . Smokeless tobacco: None  . Alcohol Use: No    Review of Systems Constitutional: No fever/chills Eyes: No visual changes. ENT: No sore throat. Cardiovascular: Denies chest pain. Respiratory: Denies shortness of breath. Gastrointestinal: No abdominal pain.  No nausea, no vomiting.  No diarrhea.  No constipation. Genitourinary: Negative for dysuria. Musculoskeletal: Positive for point tenderness in the left shoulder scapular area. Skin: Negative for rash. Neurological: Negative for headaches, focal weakness or numbness.  10-point ROS otherwise negative.  ____________________________________________   PHYSICAL EXAM:  VITAL SIGNS: ED Triage Vitals  Enc Vitals Group     BP 10/22/15 1036 106/78 mmHg     Pulse Rate 10/22/15 1036 87     Resp 10/22/15 1036 20     Temp 10/22/15 1036 97.5 F (36.4 C)     Temp Source 10/22/15 1036 Oral     SpO2 10/22/15 1036 96 %     Weight 10/22/15 1036 171 lb (77.565 kg)  Height 10/22/15 1036 5\' 9"  (1.753 m)     Head Cir --      Peak Flow --      Pain Score 10/22/15 1037 10     Pain Loc --      Pain Edu? --      Excl. in GC? --     Constitutional: Alert and oriented. Well appearing and in no acute distress. Eyes: Conjunctivae are normal. PERRL. EOMI. Head: Atraumatic. Nose: No congestion/rhinnorhea. Mouth/Throat: Mucous membranes are moist.  Oropharynx non-erythematous. Neck: No stridor.  No cervical spinal tenderness to palpation. Cardiovascular: Normal rate, regular rhythm. Grossly normal heart sounds.  Good peripheral circulation. Respiratory: Normal respiratory effort.  No retractions. Lungs CTAB. Gastrointestinal: Soft and nontender. No distention. No abdominal bruits. No CVA tenderness. Musculoskeletal: Positive point tenderness to  the left scapular shoulder area for range of motion of the arm. Minimal swelling noted.  No joint effusions. Neurologic:  Normal speech and language. No gross focal neurologic deficits are appreciated. No gait instability. Skin:  Skin is warm, dry and intact. No rash noted. Psychiatric: Mood and affect are normal. Speech and behavior are normal.  ____________________________________________   LABS (all labs ordered are listed, but only abnormal results are displayed)  Labs Reviewed - No data to display ____________________________________________    PROCEDURES  Procedure(s) performed: None  Critical Care performed: No  ____________________________________________   INITIAL IMPRESSION / ASSESSMENT AND PLAN / ED COURSE  Pertinent labs & imaging results that were available during my care of the patient were reviewed by me and considered in my medical decision making (see chart for details).  Acute exacerbation of fibromyalgia. Treated initially with Toradol 60mg  IM and Valium 5mg  IM. Discharge home on current medications and follow-up with PCP tomorrow for breakthrough pain management. ____________________________________________   FINAL CLINICAL IMPRESSION(S) / ED DIAGNOSES  Final diagnoses:  Fibromyalgia affecting shoulder region      Evangeline Dakin, PA-C 10/22/15 1125  Governor Rooks, MD 10/22/15 [phone]

## 2015-10-22 NOTE — Discharge Instructions (Signed)
Myofascial Pain Syndrome and Fibromyalgia  Myofascial pain syndrome and fibromyalgia are both pain disorders. This pain may be felt mainly in your muscles.   · Myofascial pain syndrome:    Always has trigger points or tender points in the muscle that will cause pain when pressed. The pain may come and go.    Usually affects your neck, upper back, and shoulder areas. The pain often radiates into your arms and hands.  · Fibromyalgia:    Has muscle pains and tenderness that come and go.    Is often associated with fatigue and sleep disturbances.    Has trigger points.    Tends to be long-lasting (chronic), but is not life-threatening.  Fibromyalgia and myofascial pain are not the same. However, they often occur together. If you have both conditions, each can make the other worse. Both are common and can cause enough pain and fatigue to make day-to-day activities difficult.   CAUSES   The exact causes of fibromyalgia and myofascial pain are not known. People with certain gene types may be more likely to develop fibromyalgia. Some factors can be triggers for both conditions, such as:   · Spine disorders.  · Arthritis.  · Severe injury (trauma) and other physical stressors.  · Being under a lot of stress.  · A medical illness.  SIGNS AND SYMPTOMS   Fibromyalgia  The main symptom of fibromyalgia is widespread pain and tenderness in your muscles. This can vary over time. Pain is sometimes described as stabbing, shooting, or burning. You may have tingling or numbness, too. You may also have sleep problems and fatigue. You may wake up feeling tired and groggy (fibro fog). Other symptoms may include:   · Bowel and bladder problems.  · Headaches.  · Visual problems.  · Problems with odors and noises.  · Depression or mood changes.  · Painful menstrual periods (dysmenorrhea).  · Dry skin or eyes.  Myofascial pain syndrome  Symptoms of myofascial pain syndrome include:   · Tight, ropy bands of muscle.    · Uncomfortable  sensations in muscular areas, such as:    Aching.    Cramping.    Burning.    Numbness.    Tingling.      Muscle weakness.  · Trouble moving certain muscles freely (range of motion).  DIAGNOSIS   There are no specific tests to diagnose fibromyalgia or myofascial pain syndrome. Both can be hard to diagnose because their symptoms are common in many other conditions. Your health care provider may suspect one or both of these conditions based on your symptoms and medical history. Your health care provider will also do a physical exam.   The key to diagnosing fibromyalgia is having pain, fatigue, and other symptoms for more than three months that cannot be explained by another condition.   The key to diagnosing myofascial pain syndrome is finding trigger points in muscles that are tender and cause pain elsewhere in your body (referred pain).  TREATMENT   Treating fibromyalgia and myofascial pain often requires a team of health care providers. This usually starts with your primary provider and a physical therapist. You may also find it helpful to work with alternative health care providers, such as massage therapists or acupuncturists.  Treatment for fibromyalgia may include medicines. This may include nonsteroidal anti-inflammatory drugs (NSAIDs), along with other medicines.   Treatment for myofascial pain may also include:  · NSAIDs.  · Cooling and stretching of muscles.  · Trigger point injections.  ·   Sound wave (ultrasound) treatments to stimulate muscles.  HOME CARE INSTRUCTIONS   · Take medicines only as directed by your health care provider.  · Exercise as directed by your health care provider or physical therapist.  · Try to avoid stressful situations.  · Practice relaxation techniques to control your stress. You may want to try:    Biofeedback.    Visual imagery.    Hypnosis.    Muscle relaxation.    Yoga.    Meditation.  · Talk to your health care provider about alternative treatments, such as acupuncture or  massage treatment.  · Maintain a healthy lifestyle. This includes eating a healthy diet and getting enough sleep.  · Consider joining a support group.  · Do not do activities that stress or strain your muscles. That includes repetitive motions and heavy lifting.  SEEK MEDICAL CARE IF:   · You have new symptoms.  · Your symptoms get worse.  · You have side effects from your medicines.  · You have trouble sleeping.  · Your condition is causing depression or anxiety.  FOR MORE INFORMATION   · National Fibromyalgia Association: http://www.fmaware.orgwww.fmaware.org  · Arthritis Foundation: http://www.arthritis.orgwww.arthritis.org  · American Chronic Pain Association: http://www.theacpa.org/condition/myofascial-painwww.theacpa.org/condition/myofascial-pain     This information is not intended to replace advice given to you by your health care provider. Make sure you discuss any questions you have with your health care provider.     Document Released: 11/18/2005 Document Revised: 12/09/2014 Document Reviewed: 08/24/2014  Elsevier Interactive Patient Education ©2016 [employer] Inc[employer]

## 2015-12-20 ENCOUNTER — Encounter: Payer: Self-pay | Admitting: Emergency Medicine

## 2015-12-20 ENCOUNTER — Emergency Department
Admission: EM | Admit: 2015-12-20 | Discharge: 2015-12-20 | Disposition: A | Discharge status: Home / Self Care | Payer: Medicaid Other | Attending: Emergency Medicine | Admitting: Emergency Medicine

## 2015-12-20 DIAGNOSIS — Z79891 Long term (current) use of opiate analgesic: Secondary | ICD-10-CM | POA: Diagnosis not present

## 2015-12-20 DIAGNOSIS — R51 Headache: Secondary | ICD-10-CM | POA: Insufficient documentation

## 2015-12-20 DIAGNOSIS — Z79899 Other long term (current) drug therapy: Secondary | ICD-10-CM | POA: Diagnosis not present

## 2015-12-20 DIAGNOSIS — R109 Unspecified abdominal pain: Secondary | ICD-10-CM | POA: Insufficient documentation

## 2015-12-20 DIAGNOSIS — R110 Nausea: Secondary | ICD-10-CM | POA: Insufficient documentation

## 2015-12-20 DIAGNOSIS — M797 Fibromyalgia: Secondary | ICD-10-CM

## 2015-12-20 MED ORDER — DIAZEPAM 5 MG/ML IJ SOLN
5.0000 mg | Freq: Once | INTRAMUSCULAR | Status: AC
  Administered 2015-12-20: 5 mg via INTRAMUSCULAR
  Filled 2015-12-20: qty 2

## 2015-12-20 MED ORDER — BACLOFEN 10 MG PO TABS: 10.0000 mg | ORAL_TABLET | Freq: Three times a day (TID) | ORAL | Status: DC

## 2015-12-20 MED ORDER — KETOROLAC TROMETHAMINE 60 MG/2ML IM SOLN
60.0000 mg | Freq: Once | INTRAMUSCULAR | Status: AC
  Administered 2015-12-20: 60 mg via INTRAMUSCULAR
  Filled 2015-12-20: qty 2

## 2015-12-20 MED ORDER — ETODOLAC 400 MG PO TABS: 400.0000 mg | ORAL_TABLET | Freq: Two times a day (BID) | ORAL | Status: DC
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
## 2015-12-20 NOTE — ED Notes (Signed)
Pt c/o pain in upper back that radiates down.  Pt c/o neck pain, pain in arms and hips.  Pt able to move all extremities.  Pt sts that she saw PCP this AM and was referred here.

## 2015-12-20 NOTE — Discharge Instructions (Signed)
Myofascial Pain Syndrome and Fibromyalgia  Myofascial pain syndrome and fibromyalgia are both pain disorders. This pain may be felt mainly in your muscles.   · Myofascial pain syndrome:    Always has trigger points or tender points in the muscle that will cause pain when pressed. The pain may come and go.    Usually affects your neck, upper back, and shoulder areas. The pain often radiates into your arms and hands.  · Fibromyalgia:    Has muscle pains and tenderness that come and go.    Is often associated with fatigue and sleep disturbances.    Has trigger points.    Tends to be long-lasting (chronic), but is not life-threatening.  Fibromyalgia and myofascial pain are not the same. However, they often occur together. If you have both conditions, each can make the other worse. Both are common and can cause enough pain and fatigue to make day-to-day activities difficult.   CAUSES   The exact causes of fibromyalgia and myofascial pain are not known. People with certain gene types may be more likely to develop fibromyalgia. Some factors can be triggers for both conditions, such as:   · Spine disorders.  · Arthritis.  · Severe injury (trauma) and other physical stressors.  · Being under a lot of stress.  · A medical illness.  SIGNS AND SYMPTOMS   Fibromyalgia  The main symptom of fibromyalgia is widespread pain and tenderness in your muscles. This can vary over time. Pain is sometimes described as stabbing, shooting, or burning. You may have tingling or numbness, too. You may also have sleep problems and fatigue. You may wake up feeling tired and groggy (fibro fog). Other symptoms may include:   · Bowel and bladder problems.  · Headaches.  · Visual problems.  · Problems with odors and noises.  · Depression or mood changes.  · Painful menstrual periods (dysmenorrhea).  · Dry skin or eyes.  Myofascial pain syndrome  Symptoms of myofascial pain syndrome include:   · Tight, ropy bands of muscle.    · Uncomfortable  sensations in muscular areas, such as:    Aching.    Cramping.    Burning.    Numbness.    Tingling.      Muscle weakness.  · Trouble moving certain muscles freely (range of motion).  DIAGNOSIS   There are no specific tests to diagnose fibromyalgia or myofascial pain syndrome. Both can be hard to diagnose because their symptoms are common in many other conditions. Your health care provider may suspect one or both of these conditions based on your symptoms and medical history. Your health care provider will also do a physical exam.   The key to diagnosing fibromyalgia is having pain, fatigue, and other symptoms for more than three months that cannot be explained by another condition.   The key to diagnosing myofascial pain syndrome is finding trigger points in muscles that are tender and cause pain elsewhere in your body (referred pain).  TREATMENT   Treating fibromyalgia and myofascial pain often requires a team of health care providers. This usually starts with your primary provider and a physical therapist. You may also find it helpful to work with alternative health care providers, such as massage therapists or acupuncturists.  Treatment for fibromyalgia may include medicines. This may include nonsteroidal anti-inflammatory drugs (NSAIDs), along with other medicines.   Treatment for myofascial pain may also include:  · NSAIDs.  · Cooling and stretching of muscles.  · Trigger point injections.  ·   Sound wave (ultrasound) treatments to stimulate muscles.  HOME CARE INSTRUCTIONS   · Take medicines only as directed by your health care provider.  · Exercise as directed by your health care provider or physical therapist.  · Try to avoid stressful situations.  · Practice relaxation techniques to control your stress. You may want to try:    Biofeedback.    Visual imagery.    Hypnosis.    Muscle relaxation.    Yoga.    Meditation.  · Talk to your health care provider about alternative treatments, such as acupuncture or  massage treatment.  · Maintain a healthy lifestyle. This includes eating a healthy diet and getting enough sleep.  · Consider joining a support group.  · Do not do activities that stress or strain your muscles. That includes repetitive motions and heavy lifting.  SEEK MEDICAL CARE IF:   · You have new symptoms.  · Your symptoms get worse.  · You have side effects from your medicines.  · You have trouble sleeping.  · Your condition is causing depression or anxiety.  FOR MORE INFORMATION   · National Fibromyalgia Association: http://www.fmaware.orgwww.fmaware.org  · Arthritis Foundation: http://www.arthritis.orgwww.arthritis.org  · American Chronic Pain Association: http://www.theacpa.org/condition/myofascial-painwww.theacpa.org/condition/myofascial-pain     This information is not intended to replace advice given to you by your health care provider. Make sure you discuss any questions you have with your health care provider.     Document Released: 11/18/2005 Document Revised: 12/09/2014 Document Reviewed: 08/24/2014  Elsevier Interactive Patient Education ©2016 [employer] Inc[employer]

## 2015-12-20 NOTE — ED Notes (Signed)
Reports fibromyalgia.  C/o pain in neck and wrists and fingers.  States her doctor keeps giving her gabapentin and lyrica but they are not working.  Skin w/d, NAD

## 2016-01-15 LAB — HEMOGLOBIN A1C: HEMOGLOBIN A1C: 5.9

## 2016-04-15 ENCOUNTER — Other Ambulatory Visit: Payer: Self-pay | Admitting: Physician Assistant

## 2016-04-15 DIAGNOSIS — Z1231 Encounter for screening mammogram for malignant neoplasm of breast: Secondary | ICD-10-CM

## 2016-04-19 ENCOUNTER — Telehealth: Payer: Self-pay | Admitting: *Deleted

## 2016-04-19 NOTE — Telephone Encounter (Signed)
lm on vm stating that the pt appt for 05/03/16 @ 11:15am has been cancelled and will be placed on the waiting list. due to Dr. Starling Manns has placed a hold on New pts....TD

## 2016-04-30 ENCOUNTER — Ambulatory Visit: Payer: Medicaid Other | Attending: Physician Assistant

## 2016-05-03 ENCOUNTER — Ambulatory Visit: Payer: Medicaid Other | Admitting: Anesthesiology

## 2016-05-17 DIAGNOSIS — M545 Low back pain: Secondary | ICD-10-CM

## 2016-05-17 DIAGNOSIS — I10 Essential (primary) hypertension: Secondary | ICD-10-CM | POA: Insufficient documentation

## 2016-05-17 DIAGNOSIS — G8929 Other chronic pain: Secondary | ICD-10-CM | POA: Insufficient documentation

## 2016-06-14 DIAGNOSIS — M47816 Spondylosis without myelopathy or radiculopathy, lumbar region: Secondary | ICD-10-CM

## 2016-06-14 DIAGNOSIS — M47812 Spondylosis without myelopathy or radiculopathy, cervical region: Secondary | ICD-10-CM | POA: Insufficient documentation

## 2016-06-16 DIAGNOSIS — M2550 Pain in unspecified joint: Secondary | ICD-10-CM | POA: Insufficient documentation

## 2016-07-02 DIAGNOSIS — E559 Vitamin D deficiency, unspecified: Secondary | ICD-10-CM | POA: Insufficient documentation

## 2016-08-07 ENCOUNTER — Other Ambulatory Visit: Payer: Self-pay | Admitting: Internal Medicine

## 2016-08-07 DIAGNOSIS — Z1231 Encounter for screening mammogram for malignant neoplasm of breast: Secondary | ICD-10-CM

## 2016-08-16 ENCOUNTER — Other Ambulatory Visit: Payer: Self-pay | Admitting: Internal Medicine

## 2016-08-16 ENCOUNTER — Ambulatory Visit
Admission: RE | Admit: 2016-08-16 | Discharge: 2016-08-16 | Disposition: A | Discharge status: Home / Self Care | Payer: Medicare Other | Source: Ambulatory Visit | Attending: Internal Medicine | Admitting: Internal Medicine

## 2016-08-16 DIAGNOSIS — R600 Localized edema: Secondary | ICD-10-CM

## 2016-08-19 ENCOUNTER — Ambulatory Visit: Admission: RE | Admit: 2016-08-19 | Payer: Medicaid Other | Source: Ambulatory Visit

## 2016-08-29 ENCOUNTER — Ambulatory Visit
Admission: RE | Admit: 2016-08-29 | Discharge: 2016-08-29 | Disposition: A | Discharge status: Home / Self Care | Payer: Medicaid Other | Source: Ambulatory Visit | Attending: Physician Assistant | Admitting: Physician Assistant

## 2016-08-29 DIAGNOSIS — M7918 Myalgia, other site: Secondary | ICD-10-CM | POA: Insufficient documentation

## 2016-08-29 DIAGNOSIS — Z1231 Encounter for screening mammogram for malignant neoplasm of breast: Secondary | ICD-10-CM

## 2016-09-17 ENCOUNTER — Ambulatory Visit: Payer: 59 | Attending: Internal Medicine

## 2016-10-05 ENCOUNTER — Emergency Department
Admission: EM | Admit: 2016-10-05 | Discharge: 2016-10-05 | Disposition: A | Discharge status: Home / Self Care | Payer: Medicare Other | Attending: Emergency Medicine | Admitting: Emergency Medicine

## 2016-10-05 DIAGNOSIS — Z87891 Personal history of nicotine dependence: Secondary | ICD-10-CM | POA: Insufficient documentation

## 2016-10-05 DIAGNOSIS — L739 Follicular disorder, unspecified: Secondary | ICD-10-CM | POA: Insufficient documentation

## 2016-10-05 DIAGNOSIS — Z79899 Other long term (current) drug therapy: Secondary | ICD-10-CM | POA: Insufficient documentation

## 2016-10-05 DIAGNOSIS — R590 Localized enlarged lymph nodes: Secondary | ICD-10-CM

## 2016-10-05 DIAGNOSIS — I10 Essential (primary) hypertension: Secondary | ICD-10-CM | POA: Diagnosis not present

## 2016-10-05 MED ORDER — IBUPROFEN 600 MG PO TABS: 600.0000 mg | ORAL_TABLET | Freq: Three times a day (TID) | ORAL | 0 refills | Status: DC | PRN

## 2016-10-05 MED ORDER — OXYCODONE-ACETAMINOPHEN 5-325 MG PO TABS
1.0000 | ORAL_TABLET | Freq: Once | ORAL | Status: AC
  Administered 2016-10-05: 1 via ORAL
  Filled 2016-10-05: qty 1

## 2016-10-05 MED ORDER — SULFAMETHOXAZOLE-TRIMETHOPRIM 800-160 MG PO TABS: 1.0000 | ORAL_TABLET | Freq: Two times a day (BID) | ORAL | 0 refills | Status: DC

## 2016-10-05 MED ORDER — SULFAMETHOXAZOLE-TRIMETHOPRIM 800-160 MG PO TABS
1.0000 | ORAL_TABLET | Freq: Once | ORAL | Status: AC
  Administered 2016-10-05: 1 via ORAL
  Filled 2016-10-05: qty 1

## 2016-10-05 MED ORDER — IBUPROFEN 600 MG PO TABS
600.0000 mg | ORAL_TABLET | Freq: Once | ORAL | Status: AC
  Administered 2016-10-05: 600 mg via ORAL
  Filled 2016-10-05: qty 1

## 2016-10-05 MED ORDER — OXYCODONE-ACETAMINOPHEN 5-325 MG PO TABS: 1.0000 | ORAL_TABLET | Freq: Four times a day (QID) | ORAL | 0 refills | Status: DC | PRN

## 2016-10-05 NOTE — ED Triage Notes (Signed)
Pt states that for the past 3 days she hasn't been feeling well, pt states that she had a deep cleaning to her teeth on Wednesday. Pt also states that she found an abscess to the lower rt side of her head yesterday  With some swelling noted to her lymph nodes.

## 2016-10-05 NOTE — ED Provider Notes (Signed)
[facility] Emergency Department Provider Note   ____________________________________________   None    (approximate)  I have reviewed the triage vital signs and the nursing notes.   HISTORY  Chief Complaint Abscess    HPI Susan Macias is a 45 y.o. female complaining of malaise and a swollen lymph node right lateral posterior neck area. Patient stated 3 days ago she had a deep dental cleaning respiratory complaint. Patient states the deep cleaning was on the left lower teeth. Patient states she awakened Thursday feeling malaise. Yesterday the patient states she noticed a swollen lymph node as described above. Patient also was told she had some swelling at the right lower hairline. Patient denies any discharge from the swollen area at the hairline. Patient denies any fever associated this complaint. Patient rates the pain as a 8/10. No palliative measures for this complaint. Past Medical History:  Diagnosis Date  . Fibromyalgia   . Hypertension     There are no active problems to display for this patient.   Past Surgical History:  Procedure Laterality Date  . ABDOMINAL HYSTERECTOMY      Prior to Admission medications   Medication Sig Start Date End Date Taking? Authorizing Provider  baclofen (LIORESAL) 10 MG tablet Take 1 tablet (10 mg total) by mouth 3 (three) times daily. 12/20/15   Evangeline Dakinharles M Beers, PA-C  etodolac (LODINE) 400 MG tablet Take 1 tablet (400 mg total) by mouth 2 (two) times daily. 12/20/15   Charmayne Sheerharles M Beers, PA-C  FLUoxetine (PROZAC) 20 MG capsule Take 20 mg by mouth daily.    Historical Provider, MD  ibuprofen (ADVIL,MOTRIN) 600 MG tablet Take 1 tablet (600 mg total) by mouth every 8 (eight) hours as needed. 10/05/16   Joni Reiningonald K Smith, PA-C  lisinopril-hydrochlorothiazide (PRINZIDE,ZESTORETIC) 10-12.5 MG per tablet Take 1 tablet by mouth daily.    Historical Provider, MD  oxyCODONE-acetaminophen (ROXICET) 5-325 MG tablet Take 1 tablet  by mouth every 6 (six) hours as needed. 10/05/16 10/05/17  Joni Reiningonald K Smith, PA-C  pregabalin (LYRICA) 75 MG capsule Take 150 mg by mouth 3 (three) times daily.    Historical Provider, MD  sulfamethoxazole-trimethoprim (BACTRIM DS,SEPTRA DS) 800-160 MG tablet Take 1 tablet by mouth 2 (two) times daily. 10/05/16   Joni Reiningonald K Smith, PA-C    Allergies   No family history on file.  Social History Social History  Substance Use Topics  . Smoking status: Former Games developermoker  . Smokeless tobacco: Not on file  . Alcohol use No    Review of Systems Constitutional: No fever/chills Eyes: No visual changes. ENT: No sore throat. Cardiovascular: Denies chest pain. Respiratory: Denies shortness of breath. Gastrointestinal: No abdominal pain.  No nausea, no vomiting.  No diarrhea.  No constipation. Genitourinary: Negative for dysuria. Musculoskeletal: Negative for back pain. Skin: Negative for rash. Neurological: Negative for headaches, focal weakness or numbness. Endocrine:Hypertension Hematological/Lymphatic: Allergic/Immunilogical: Aspirin ___________________________________________   PHYSICAL EXAM:  VITAL SIGNS: ED Triage Vitals  Enc Vitals Group     BP 10/05/16 1348 110/80     Pulse Rate 10/05/16 1348 84     Resp 10/05/16 1348 16     Temp 10/05/16 1348 97.6 F (36.4 C)     Temp Source 10/05/16 1348 Oral     SpO2 10/05/16 1348 100 %     Weight 10/05/16 1349 181 lb (82.1 kg)     Height 10/05/16 1349 5\' 8"  (1.727 m)     Head Circumference --  Peak Flow --      Pain Score 10/05/16 1349 10     Pain Loc --      Pain Edu? --      Excl. in GC? --     Constitutional: Alert and oriented. Well appearing and in no acute distress. Eyes: Conjunctivae are normal. PERRL. EOMI. Head: Atraumatic. Nose: No congestion/rhinnorhea. Mouth/Throat: Mucous membranes are moist.  Oropharynx non-erythematous. Neck: No stridor.  No cervical spine tenderness to  palpation. Hematological/Lymphatic/Immunilogical:  cervical lymphadenopathy. Cardiovascular: Normal rate, regular rhythm. Grossly normal heart sounds.  Good peripheral circulation. Respiratory: Normal respiratory effort.  No retractions. Lungs CTAB. Gastrointestinal: Soft and nontender. No distention. No abdominal bruits. No CVA tenderness. Musculoskeletal: No lower extremity tenderness nor edema.  No joint effusions. Neurologic:  Normal speech and language. No gross focal neurologic deficits are appreciated. No gait instability. Skin:  Skin is warm, dry and intact. No rash noted. Multiple papular lesions posterior inferior hairline Psychiatric: Mood and affect are normal. Speech and behavior are normal.  ____________________________________________   LABS (all labs ordered are listed, but only abnormal results are displayed)  Labs Reviewed - No data to display ____________________________________________  EKG   ____________________________________________  RADIOLOGY   ____________________________________________   PROCEDURES  Procedure(s) performed: None  Procedures  Critical Care performed: No  ____________________________________________   INITIAL IMPRESSION / ASSESSMENT AND PLAN / ED COURSE  Pertinent labs & imaging results that were available during my care of the patient were reviewed by me and considered in my medical decision making (see chart for details).  Folliculitis with adenopathy. Patient given discharge care instructions. Patient given prescription for Bactrim DS, Percocet, and ibuprofen. Patient advised follow-up family doctor is no improvement 3-5 days.  Clinical Course     ____________________________________________   FINAL CLINICAL IMPRESSION(S) / ED DIAGNOSES  Final diagnoses:  Folliculitis  Posterior cervical adenopathy      NEW MEDICATIONS STARTED DURING THIS VISIT:  New Prescriptions   IBUPROFEN (ADVIL,MOTRIN) 600 MG TABLET     Take 1 tablet (600 mg total) by mouth every 8 (eight) hours as needed.   OXYCODONE-ACETAMINOPHEN (ROXICET) 5-325 MG TABLET    Take 1 tablet by mouth every 6 (six) hours as needed.   SULFAMETHOXAZOLE-TRIMETHOPRIM (BACTRIM DS,SEPTRA DS) 800-160 MG TABLET    Take 1 tablet by mouth 2 (two) times daily.     Note:  This document was prepared using Dragon voice recognition software and may include unintentional dictation errors.    [name], PA-C 10/05/16 1418    Phineas Semen, MD 10/05/16 [phone]

## 2016-10-29 ENCOUNTER — Emergency Department
Admission: EM | Admit: 2016-10-29 | Discharge: 2016-10-29 | Disposition: A | Discharge status: Home / Self Care | Payer: Medicare Other | Attending: Emergency Medicine | Admitting: Emergency Medicine

## 2016-10-29 DIAGNOSIS — G8929 Other chronic pain: Secondary | ICD-10-CM | POA: Diagnosis not present

## 2016-10-29 DIAGNOSIS — I10 Essential (primary) hypertension: Secondary | ICD-10-CM | POA: Diagnosis not present

## 2016-10-29 DIAGNOSIS — Z79899 Other long term (current) drug therapy: Secondary | ICD-10-CM | POA: Insufficient documentation

## 2016-10-29 DIAGNOSIS — M25551 Pain in right hip: Secondary | ICD-10-CM | POA: Diagnosis present

## 2016-10-29 DIAGNOSIS — Z87891 Personal history of nicotine dependence: Secondary | ICD-10-CM | POA: Insufficient documentation

## 2016-10-29 MED ORDER — KETOROLAC TROMETHAMINE 30 MG/ML IJ SOLN
30.0000 mg | Freq: Once | INTRAMUSCULAR | Status: AC
  Administered 2016-10-29: 30 mg via INTRAMUSCULAR
  Filled 2016-10-29: qty 1

## 2016-10-29 MED ORDER — BACLOFEN 10 MG PO TABS: 10.0000 mg | ORAL_TABLET | Freq: Three times a day (TID) | ORAL | 0 refills | Status: AC | PRN

## 2016-10-29 MED ORDER — ORPHENADRINE CITRATE 30 MG/ML IJ SOLN
60.0000 mg | Freq: Two times a day (BID) | INTRAMUSCULAR | Status: DC
  Administered 2016-10-29: 60 mg via INTRAMUSCULAR
  Filled 2016-10-29: qty 2
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
## 2016-10-29 NOTE — ED Triage Notes (Signed)
Pt c/o pain in the right hip around to "my private area" for the past couple of days, states it is a fibromyalgia flare up, states she has an apt with neuro in 2 months.. Pt ambulatory to triage without difficulty.

## 2016-11-05 LAB — LIPID PANEL
Cholesterol: 193 (ref 0–200)
HDL: 50 (ref 35–70)
LDL Cholesterol: 120
Triglycerides: 121 (ref 40–160)

## 2016-11-05 LAB — CBC AND DIFFERENTIAL
HCT: 37 (ref 36–46)
HEMOGLOBIN: 12.1 (ref 12.0–16.0)
NEUTROS ABS: 2401
PLATELETS: 268 (ref 150–399)
WBC: 4.9

## 2016-11-05 LAB — HM HIV SCREENING LAB: HM HIV SCREENING: NEGATIVE

## 2016-11-05 LAB — POCT INR: INR: 1 (ref 0.9–1.1)

## 2016-11-05 LAB — HM HEPATITIS C SCREENING LAB: HM Hepatitis Screen: NEGATIVE

## 2016-11-05 LAB — PROTIME-INR: Protime: 10.4 (ref 10.0–13.8)

## 2016-11-12 ENCOUNTER — Ambulatory Visit
Admission: RE | Admit: 2016-11-12 | Discharge: 2016-11-12 | Disposition: A | Discharge status: Home / Self Care | Payer: Medicare Other | Source: Ambulatory Visit | Attending: Internal Medicine | Admitting: Internal Medicine

## 2016-11-12 DIAGNOSIS — Z1231 Encounter for screening mammogram for malignant neoplasm of breast: Secondary | ICD-10-CM | POA: Insufficient documentation

## 2016-12-02 DIAGNOSIS — I219 Acute myocardial infarction, unspecified: Secondary | ICD-10-CM

## 2016-12-02 DIAGNOSIS — K219 Gastro-esophageal reflux disease without esophagitis: Secondary | ICD-10-CM

## 2016-12-02 HISTORY — DX: Gastro-esophageal reflux disease without esophagitis: K21.9

## 2016-12-02 HISTORY — DX: Acute myocardial infarction, unspecified: I21.9

## 2017-02-19 DIAGNOSIS — M47812 Spondylosis without myelopathy or radiculopathy, cervical region: Secondary | ICD-10-CM

## 2017-02-19 DIAGNOSIS — M5030 Other cervical disc degeneration, unspecified cervical region: Secondary | ICD-10-CM | POA: Insufficient documentation

## 2017-02-19 DIAGNOSIS — M797 Fibromyalgia: Secondary | ICD-10-CM | POA: Insufficient documentation

## 2017-02-19 DIAGNOSIS — E559 Vitamin D deficiency, unspecified: Secondary | ICD-10-CM | POA: Insufficient documentation

## 2017-02-19 DIAGNOSIS — F329 Major depressive disorder, single episode, unspecified: Secondary | ICD-10-CM | POA: Insufficient documentation

## 2017-02-19 DIAGNOSIS — I10 Essential (primary) hypertension: Secondary | ICD-10-CM | POA: Insufficient documentation

## 2017-02-19 DIAGNOSIS — M47816 Spondylosis without myelopathy or radiculopathy, lumbar region: Secondary | ICD-10-CM | POA: Insufficient documentation

## 2017-02-19 DIAGNOSIS — G894 Chronic pain syndrome: Secondary | ICD-10-CM | POA: Insufficient documentation

## 2017-02-19 DIAGNOSIS — F419 Anxiety disorder, unspecified: Secondary | ICD-10-CM | POA: Insufficient documentation

## 2017-02-19 DIAGNOSIS — F32A Depression, unspecified: Secondary | ICD-10-CM | POA: Insufficient documentation

## 2017-02-19 DIAGNOSIS — F4310 Post-traumatic stress disorder, unspecified: Secondary | ICD-10-CM

## 2017-04-07 ENCOUNTER — Ambulatory Visit: Payer: Self-pay | Admitting: Family Medicine

## 2017-04-22 ENCOUNTER — Encounter: Payer: Self-pay | Admitting: Emergency Medicine

## 2017-04-22 ENCOUNTER — Emergency Department
Admission: EM | Admit: 2017-04-22 | Discharge: 2017-04-22 | Disposition: A | Discharge status: Home / Self Care | Payer: Medicare Other | Attending: Student in an Organized Health Care Education/Training Program | Admitting: Student in an Organized Health Care Education/Training Program

## 2017-04-22 DIAGNOSIS — M797 Fibromyalgia: Secondary | ICD-10-CM

## 2017-04-22 DIAGNOSIS — Z760 Encounter for issue of repeat prescription: Secondary | ICD-10-CM | POA: Diagnosis not present

## 2017-04-22 DIAGNOSIS — Z87891 Personal history of nicotine dependence: Secondary | ICD-10-CM | POA: Diagnosis not present

## 2017-04-22 DIAGNOSIS — I10 Essential (primary) hypertension: Secondary | ICD-10-CM | POA: Diagnosis not present

## 2017-04-22 MED ORDER — METHYLPREDNISOLONE SODIUM SUCC 125 MG IJ SOLR
125.0000 mg | Freq: Once | INTRAMUSCULAR | Status: AC
  Administered 2017-04-22: 125 mg via INTRAMUSCULAR
  Filled 2017-04-22: qty 2

## 2017-04-22 MED ORDER — PREGABALIN 150 MG PO CAPS: 150.0000 mg | ORAL_CAPSULE | Freq: Two times a day (BID) | ORAL | 1 refills | Status: DC

## 2017-04-22 NOTE — ED Triage Notes (Addendum)
States "I have a severe case of fibromyalgia.  Current flare has lasted about one month.  Typical flare is fever and swelling."  Has been taking Lyrica x 6 years, but has been out of medication for one month.  Has appointment with PCP on July 12.

## 2017-04-22 NOTE — ED Provider Notes (Signed)
[facility] Emergency Department Provider Note   ____________________________________________   First MD Initiated Contact with Patient 04/22/17 1459     (approximate)  I have reviewed the triage vital signs and the nursing notes.   HISTORY  Chief Complaint Medication Refill    HPI Susan Macias is a 46 y.o. female patient is requesting a refill of Lyrica. Patient states taken his medicine for sick she is propranolol one month ago. Patient [facility] has reassignd her a new PCP.She is not scheduled to see Dr. Provider until 06/12/2017. Patient states she is is experiencing severe flareup of fibromyalgia.patient is rating her pain as a 6/10. Patient described her pain as "generalized aching". No palliative measures for her complaint. Past Medical History:  Diagnosis Date  . Anxiety   . Chronic low back pain   . Chronic pain syndrome   . Degenerative joint disease (DJD) of lumbar spine   . Depression   . DJD (degenerative joint disease), cervical   . Fibromyalgia   . Hypertension   . PTSD (post-traumatic stress disorder)   . Spondylosis of cervical region without myelopathy or radiculopathy   . Vitamin D deficiency     Patient Active Problem List   Diagnosis Date Noted  . Fibromyalgia   . Vitamin D deficiency   . DJD (degenerative joint disease), cervical   . Degenerative joint disease (DJD) of lumbar spine   . Hypertension   . Chronic low back pain   . Chronic pain syndrome   . Anxiety   . Depression   . PTSD (post-traumatic stress disorder)   . Spondylosis of cervical region without myelopathy or radiculopathy     Past Surgical History:  Procedure Laterality Date  . ABDOMINAL HYSTERECTOMY    . MOUTH SURGERY      Prior to Admission medications   Medication Sig Start Date End Date Taking? Authorizing Provider  FLUoxetine (PROZAC) 20 MG capsule Take 20 mg by mouth daily.    [provider]    lisinopril-hydrochlorothiazide (PRINZIDE,ZESTORETIC) 10-12.5 MG per tablet Take 1 tablet by mouth daily.    [provider]  pregabalin (LYRICA) 150 MG capsule Take 1 capsule (150 mg total) by mouth 2 (two) times daily. 04/22/17 04/22/18  [name], PA-C  pregabalin (LYRICA) 75 MG capsule Take 150 mg by mouth 3 (three) times daily.    [provider]    Allergies Asa [aspirin]  Family History  Problem Relation Age of Onset  . Breast cancer Paternal Aunt   . Schizophrenia Mother   . Cancer Father        throat  . Hypertension Father   . Arthritis Father   . Hyperlipidemia Father   . Cancer Maternal Grandmother   . Hypertension Maternal Grandmother   . Cancer Maternal Grandfather   . Hypertension Maternal Grandfather   . Cancer Paternal Grandmother   . Hypertension Paternal Grandmother   . Cancer Paternal Grandfather   . Hypertension Paternal Grandfather     Social History Social History  Substance Use Topics  . Smoking status: Former Games developer  . Smokeless tobacco: Never Used  . Alcohol use No    Review of Systems  Constitutional: No fever/chills Eyes: No visual changes. ENT: No sore throat. Cardiovascular: Denies chest pain. Respiratory: Denies shortness of breath. Gastrointestinal: No abdominal pain.  No nausea, no vomiting.  No diarrhea.  No constipation. Genitourinary: Negative for dysuria. Musculoskeletal: Negative for back pain. Skin: Negative for rash. Neurological: Negative  for headaches, focal weakness or numbness. Psychiatric:anxiety, depression, posttraumatic stress disorder. Endocrine:hypertension Allergic/Immunilogical: aspirin ____________________________________________   PHYSICAL EXAM:  VITAL SIGNS: ED Triage Vitals  Enc Vitals Group     BP 04/22/17 1427 (!) 155/102     Pulse Rate 04/22/17 1427 98     Resp 04/22/17 1427 16     Temp 04/22/17 1427 98.5 F (36.9 C)     Temp Source 04/22/17 1427 Oral     SpO2 04/22/17  1427 99 %     Weight 04/22/17 1428 175 lb (79.4 kg)     Height 04/22/17 1428 5\' 9"  (1.753 m)     Head Circumference --      Peak Flow --      Pain Score 04/22/17 1427 6     Pain Loc --      Pain Edu? --      Excl. in GC? --     Constitutional: Alert and oriented. Well appearing and in no acute distress. Neck: No stridor.  No cervical spine tenderness to palpation. Hematological/Lymphatic/Immunilogical: No cervical lymphadenopathy. Cardiovascular: Normal rate, regular rhythm. Grossly normal heart sounds.  Good peripheral circulation.elevated blood pressure Respiratory: Normal respiratory effort.  No retractions. Lungs CTAB. Musculoskeletal: No lower extremity tenderness nor edema.  No joint effusions. Neurologic:  Normal speech and language. No gross focal neurologic deficits are appreciated. No gait instability. Skin:  Skin is warm, dry and intact. No rash noted. Psychiatric: Mood and affect are normal. Speech and behavior are normal.  ____________________________________________   LABS (all labs ordered are listed, but only abnormal results are displayed)  Labs Reviewed - No data to display ____________________________________________  EKG   ____________________________________________  RADIOLOGY   ____________________________________________   PROCEDURES  Procedure(s) performed: None  Procedures  Critical Care performed: No  ____________________________________________   INITIAL IMPRESSION / ASSESSMENT AND PLAN / ED COURSE  Pertinent labs & imaging results that were available during my care of the patient were reviewed by me and considered in my medical decision making (see chart for details).  Medication refill. Patient given discharged instructions and advised follow-up with scheduledappointment.      ____________________________________________   FINAL CLINICAL IMPRESSION(S) / ED DIAGNOSES  Final diagnoses:  Encounter for medication refill    Fibromyalgia      NEW MEDICATIONS STARTED DURING THIS VISIT:  New Prescriptions   PREGABALIN (LYRICA) 150 MG CAPSULE    Take 1 capsule (150 mg total) by mouth 2 (two) times daily.     Note:  This document was prepared using Dragon voice recognition software and may include unintentional dictation errors.    [name], PA-C 04/22/17 1517    Willy Eddy, MD 04/22/17 [phone]

## 2017-06-12 ENCOUNTER — Ambulatory Visit (INDEPENDENT_AMBULATORY_CARE_PROVIDER_SITE_OTHER): Payer: Medicare Other | Admitting: Family Medicine

## 2017-06-12 ENCOUNTER — Encounter: Payer: Self-pay | Admitting: Family Medicine

## 2017-06-12 VITALS — BP 157/99 | HR 93 | Ht 69.0 in | Wt 187.0 lb

## 2017-06-12 DIAGNOSIS — I10 Essential (primary) hypertension: Secondary | ICD-10-CM | POA: Diagnosis not present

## 2017-06-12 DIAGNOSIS — Z62810 Personal history of physical and sexual abuse in childhood: Secondary | ICD-10-CM | POA: Insufficient documentation

## 2017-06-12 DIAGNOSIS — Z91410 Personal history of adult physical and sexual abuse: Secondary | ICD-10-CM

## 2017-06-12 DIAGNOSIS — M5030 Other cervical disc degeneration, unspecified cervical region: Secondary | ICD-10-CM | POA: Diagnosis not present

## 2017-06-12 DIAGNOSIS — M47816 Spondylosis without myelopathy or radiculopathy, lumbar region: Secondary | ICD-10-CM | POA: Diagnosis not present

## 2017-06-12 DIAGNOSIS — F4310 Post-traumatic stress disorder, unspecified: Secondary | ICD-10-CM

## 2017-06-12 DIAGNOSIS — M47812 Spondylosis without myelopathy or radiculopathy, cervical region: Secondary | ICD-10-CM | POA: Diagnosis not present

## 2017-06-12 DIAGNOSIS — IMO0002 Reserved for concepts with insufficient information to code with codable children: Secondary | ICD-10-CM | POA: Insufficient documentation

## 2017-06-12 DIAGNOSIS — E559 Vitamin D deficiency, unspecified: Secondary | ICD-10-CM

## 2017-06-12 DIAGNOSIS — M797 Fibromyalgia: Secondary | ICD-10-CM

## 2017-06-12 DIAGNOSIS — F331 Major depressive disorder, recurrent, moderate: Secondary | ICD-10-CM

## 2017-06-12 MED ORDER — DULOXETINE HCL 20 MG PO CPEP: 20.0000 mg | ORAL_CAPSULE | Freq: Every day | ORAL | 3 refills | Status: DC

## 2017-06-12 MED ORDER — PREGABALIN 150 MG PO CAPS: 150.0000 mg | ORAL_CAPSULE | Freq: Two times a day (BID) | ORAL | 1 refills | Status: DC

## 2017-06-12 MED ORDER — LISINOPRIL-HYDROCHLOROTHIAZIDE 10-12.5 MG PO TABS: 1.0000 | ORAL_TABLET | Freq: Every day | ORAL | 1 refills | Status: DC

## 2017-06-12 NOTE — Assessment & Plan Note (Signed)
Will restart cymbalta. List of counselors given today.

## 2017-06-12 NOTE — Assessment & Plan Note (Signed)
Will restart cymbalta. List of counselors given today. Will look at x-rays of spine. Will continue her lyrica. Follow up in 2 weeks to discuss other issues. Advised exercise. Continue to monitor.

## 2017-06-12 NOTE — Assessment & Plan Note (Signed)
Will get her back on her medicine and recheck 2 week with labs.
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
## 2017-06-12 NOTE — Assessment & Plan Note (Signed)
Will restart cymbalta. List of counselors given today. 

## 2017-06-12 NOTE — Assessment & Plan Note (Signed)
Will recheck next visit.

## 2017-06-12 NOTE — Assessment & Plan Note (Signed)
Hasn't had imaging done in >5 years. Will obtain x-ray of her neck, thorax and lumbar. Await results. Will treat for fibro for now.  

## 2017-06-12 NOTE — Assessment & Plan Note (Signed)
Hasn't had imaging done in >5 years. Will obtain x-ray of her neck, thorax and lumbar. Await results. Will treat for fibro for now.

## 2017-06-16 ENCOUNTER — Ambulatory Visit
Admission: RE | Admit: 2017-06-16 | Discharge: 2017-06-16 | Disposition: A | Discharge status: Home / Self Care | Payer: Medicare Other | Source: Ambulatory Visit | Attending: Family Medicine | Admitting: Family Medicine

## 2017-06-16 ENCOUNTER — Telehealth: Payer: Self-pay | Admitting: Family Medicine

## 2017-06-16 DIAGNOSIS — M545 Low back pain: Secondary | ICD-10-CM | POA: Diagnosis not present

## 2017-06-16 DIAGNOSIS — M5030 Other cervical disc degeneration, unspecified cervical region: Secondary | ICD-10-CM | POA: Diagnosis not present

## 2017-06-16 DIAGNOSIS — M47816 Spondylosis without myelopathy or radiculopathy, lumbar region: Secondary | ICD-10-CM | POA: Diagnosis not present

## 2017-06-16 DIAGNOSIS — M47812 Spondylosis without myelopathy or radiculopathy, cervical region: Secondary | ICD-10-CM | POA: Insufficient documentation

## 2017-06-16 DIAGNOSIS — M4184 Other forms of scoliosis, thoracic region: Secondary | ICD-10-CM | POA: Diagnosis not present

## 2017-06-19 ENCOUNTER — Encounter: Payer: Self-pay | Admitting: Family Medicine

## 2017-06-19 DIAGNOSIS — M797 Fibromyalgia: Secondary | ICD-10-CM

## 2017-06-19 DIAGNOSIS — R7301 Impaired fasting glucose: Secondary | ICD-10-CM | POA: Insufficient documentation

## 2017-06-19 DIAGNOSIS — F331 Major depressive disorder, recurrent, moderate: Secondary | ICD-10-CM

## 2017-06-22 ENCOUNTER — Encounter: Payer: Self-pay | Admitting: Family Medicine

## 2017-06-26 ENCOUNTER — Ambulatory Visit: Payer: Medicare Other | Admitting: Family Medicine

## 2017-06-27 ENCOUNTER — Ambulatory Visit (INDEPENDENT_AMBULATORY_CARE_PROVIDER_SITE_OTHER): Payer: Medicare Other | Admitting: Family Medicine

## 2017-06-27 ENCOUNTER — Encounter: Payer: Self-pay | Admitting: Family Medicine

## 2017-06-27 VITALS — BP 127/91 | HR 81 | Temp 97.9°F | Wt 182.4 lb

## 2017-06-27 DIAGNOSIS — G894 Chronic pain syndrome: Secondary | ICD-10-CM

## 2017-06-27 DIAGNOSIS — I10 Essential (primary) hypertension: Secondary | ICD-10-CM

## 2017-06-27 DIAGNOSIS — M47812 Spondylosis without myelopathy or radiculopathy, cervical region: Secondary | ICD-10-CM

## 2017-06-27 DIAGNOSIS — M545 Low back pain: Secondary | ICD-10-CM | POA: Diagnosis not present

## 2017-06-27 DIAGNOSIS — M47816 Spondylosis without myelopathy or radiculopathy, lumbar region: Secondary | ICD-10-CM | POA: Diagnosis not present

## 2017-06-27 DIAGNOSIS — F331 Major depressive disorder, recurrent, moderate: Secondary | ICD-10-CM

## 2017-06-27 DIAGNOSIS — G8929 Other chronic pain: Secondary | ICD-10-CM

## 2017-06-27 DIAGNOSIS — G4700 Insomnia, unspecified: Secondary | ICD-10-CM

## 2017-06-27 DIAGNOSIS — M797 Fibromyalgia: Secondary | ICD-10-CM | POA: Diagnosis not present

## 2017-06-27 DIAGNOSIS — K219 Gastro-esophageal reflux disease without esophagitis: Secondary | ICD-10-CM

## 2017-06-27 DIAGNOSIS — M5030 Other cervical disc degeneration, unspecified cervical region: Secondary | ICD-10-CM

## 2017-06-27 MED ORDER — OMEPRAZOLE 20 MG PO CPDR: 20.0000 mg | DELAYED_RELEASE_CAPSULE | Freq: Every day | ORAL | 3 refills | Status: DC

## 2017-06-27 MED ORDER — MELOXICAM 7.5 MG PO TABS: 7.5000 mg | ORAL_TABLET | Freq: Every day | ORAL | 3 refills | Status: DC

## 2017-06-27 NOTE — Progress Notes (Signed)
BP (!) 127/91 (BP Location: Left Arm, Patient Position: Sitting, Cuff Size: Normal)   Pulse 81   Temp 97.9 F (36.6 C)   Wt 182 lb 6 oz (82.7 kg)   SpO2 100%   BMI 26.93 kg/m    Subjective:    Patient ID: Susan Macias, female    DOB: [birth year], 46 y.o.   MRN: [id number]  HPI: Susan Macias is a 46 y.o. female  Chief Complaint  Patient presents with  . Hypertension  . Gastroesophageal Reflux  . Insomnia  . Pain    Fibromyalgia? Pain clinic referral?   . Other    Patient is scheduled with CBC on July 15, 2017   HYPERTENSION Hypertension status: controlled  Satisfied with current treatment? yes Duration of hypertension: chronic BP monitoring frequency:  not checking BP medication side effects:  no Medication compliance: fair compliance Previous BP meds: lisinopril-hctz Aspirin: no Recurrent headaches: yes Visual changes: no Palpitations: no Dyspnea: no Chest pain: no Lower extremity edema: no Dizzy/lightheaded: yes  States that she is not doing well. She notes that she has severe pain all over. She has not been feeling better. This is greatly effecting her life. She is miserable.    INSOMNIA- sleeping a couple of hours during the day, taking a lot of cat naps Duration: since the pain Satisfied with sleep quality: no Difficulty falling asleep: yes Difficulty staying asleep: yes Waking a few hours after sleep onset: yes Early morning awakenings: no Daytime hypersomnolence: yes Wakes feeling refreshed: no Good sleep hygiene: no Apnea: no Snoring: no Depressed/anxious mood: yes Recent stress: yes Restless legs/nocturnal leg cramps: yes Chronic pain/arthritis: yes History of sleep study: no Treatments attempted: benadryl- gets 4 hours   GERD GERD control status: uncontrolled  Satisfied with current treatment? no Heartburn frequency: at least daily Medication side effects: not on anything  Previous GERD medications: tums Antacid use frequency:   Several times a day Duration: hours Nature: burning Location: epigastric Heartburn duration: hours  Alleviatiating factors:  nothing Aggravating factors: eating Dysphagia: no Odynophagia:  no Hematemesis: no Blood in stool: no EGD: no    Relevant past medical, surgical, family and social history reviewed and updated as indicated. Interim medical history since our last visit reviewed. Allergies and medications reviewed and updated.  Review of Systems  Respiratory: Negative.   Cardiovascular: Negative.   Gastrointestinal: Positive for abdominal distention, abdominal pain and constipation. Negative for anal bleeding, blood in stool, diarrhea, nausea, rectal pain and vomiting.  Genitourinary: Negative.   Musculoskeletal: Positive for arthralgias, back pain, gait problem, joint swelling, myalgias, neck pain and neck stiffness.  Neurological: Positive for dizziness, weakness, light-headedness, numbness and headaches. Negative for tremors, seizures, syncope, facial asymmetry and speech difficulty.  Psychiatric/Behavioral: Positive for dysphoric mood and sleep disturbance. Negative for agitation, behavioral problems, confusion, decreased concentration, hallucinations, self-injury and suicidal ideas. The patient is nervous/anxious. The patient is not hyperactive.     Per HPI unless specifically indicated above     Objective:    BP (!) 127/91 (BP Location: Left Arm, Patient Position: Sitting, Cuff Size: Normal)   Pulse 81   Temp 97.9 F (36.6 C)   Wt 182 lb 6 oz (82.7 kg)   SpO2 100%   BMI 26.93 kg/m   Wt Readings from Last 3 Encounters:  06/27/17 182 lb 6 oz (82.7 kg)  06/12/17 187 lb (84.8 kg)  04/22/17 175 lb (79.4 kg)    Physical Exam  Constitutional: She is oriented to  person, place, and time. She appears well-developed and well-nourished. No distress.  HENT:  Head: Normocephalic and atraumatic.  Right Ear: Hearing normal.  Left Ear: Hearing normal.  Nose: Nose normal.    Eyes: Conjunctivae and lids are normal. Right eye exhibits no discharge. Left eye exhibits no discharge. No scleral icterus.  Cardiovascular: Normal rate, regular rhythm, normal heart sounds and intact distal pulses.  Exam reveals no gallop and no friction rub.   No murmur heard. Pulmonary/Chest: Effort normal and breath sounds normal. No respiratory distress. She has no wheezes. She has no rales. She exhibits no tenderness.  Musculoskeletal: Normal range of motion.  Neurological: She is alert and oriented to person, place, and time.  Skin: Skin is warm, dry and intact. No rash noted. She is not diaphoretic. No erythema. No pallor.  Psychiatric: Her speech is normal and behavior is normal. Thought content normal. Her mood appears anxious. Cognition and memory are normal. She expresses impulsivity.  Nursing note and vitals reviewed.   Results for orders placed or performed in visit on 06/19/17  CBC and differential  Result Value Ref Range   Hemoglobin 12.1 12.0 - 16.0   HCT 37 36 - 46   Neutrophils Absolute 2,401    Platelets 268 150 - 399   WBC 4.9   Protime-INR  Result Value Ref Range   Protime 10.4 10.0 - 13.8  Lipid panel  Result Value Ref Range   Triglycerides 121 40 - 160   Cholesterol 193 0 - 200   HDL 50 35 - 70   LDL Cholesterol 120   Hemoglobin A1c  Result Value Ref Range   Hemoglobin A1C 5.9   [facility] HIV SCREENING LAB  Result Value Ref Range   [facility] HIV Screening Negative - Validated   [facility] HEPATITIS C SCREENING LAB  Result Value Ref Range   [facility] Hepatitis Screen Negative-Validated   POCT INR  Result Value Ref Range   INR 1.0 0.9 - 1.1      Assessment & Plan:   Problem List Items Addressed This Visit      Cardiovascular and Mediastinum   Hypertension    Under good control. Continue current regimen. Labs next visit.         Digestive   GERD (gastroesophageal reflux disease)    Not under good control. Will start omeprazole and recheck in 2 weeks. Call with any  concerns.       Relevant Medications   omeprazole (PRILOSEC) 20 MG capsule     Musculoskeletal and Integument   DJD (degenerative joint disease), cervical    Will start meloxicam for her arthritis. Discussed that this is likely not the cause of most of her pain. Continue to monitor.       Relevant Medications   meloxicam (MOBIC) 7.5 MG tablet   Other Relevant Orders   Ambulatory referral to Pain Clinic   Ambulatory referral to Integrated Behavioral Health   Degenerative joint disease (DJD) of lumbar spine    Will start meloxicam for her arthritis. Discussed that this is likely not the cause of most of her pain. Continue to monitor.       Relevant Medications   meloxicam (MOBIC) 7.5 MG tablet   Other Relevant Orders   Ambulatory referral to Pain Clinic   Ambulatory referral to Integrated Behavioral Health   Spondylosis of cervical region without myelopathy or radiculopathy    Will start meloxicam for her arthritis. Discussed that this is likely not the cause of most  of her pain. Continue to monitor.       Relevant Medications   meloxicam (MOBIC) 7.5 MG tablet   Other Relevant Orders   Ambulatory referral to Pain Clinic   Ambulatory referral to Integrated Behavior[facility]     Other   Fibromyalgia - Primary    Long discussion of fibromyalgia today. Will add meloxicam for her arthritis in her neck. Will continue current regimen and recheck 2 weeks, amy need to adjust cymbalta at that time. Advised exercise- every day. Will get her into pain management and integrative medicine. Referrals generated today. Continue to monitor.       Relevant Orders   Ambulatory referral to Pain Clinic   Ambulatory referral to Integrated Behavior[facility] Health   Chronic low back pain   Relevant Medications   meloxicam (MOBIC) 7.5 MG tablet   Other Relevant Orders   Ambulatory referral to Pain Clinic   Ambulatory referral to Integrated Behavior[facility] Health   Chronic pain syndrome    Long discussion  of fibromyalgia today. Will add meloxicam for her arthritis in her neck. Will continue current regimen and recheck 2 weeks, amy need to adjust cymbalta at that time. Advised exercise- every day. Will get her into pain management and integrative medicine. Referrals generated today. Continue to monitor.       Relevant Orders   Ambulatory referral to Pain Clinic   Ambulatory referral to Integrated Behavioral Health   Depression    Seeing psychiatry on 07/15/17. Encouraged this.       Insomnia    Long discussion today regarding stopping cat naps, darkening room and doing something during the day rather than just laying on her bed. Will really work on not napping and increasing activity.           Follow up plan: Return 2-3 weeks, for follow up fibro.

## 2017-06-29 ENCOUNTER — Encounter: Payer: Self-pay | Admitting: Family Medicine

## 2017-06-30 ENCOUNTER — Inpatient Hospital Stay
Admission: EM | Admit: 2017-06-30 | Discharge: 2017-07-01 | DRG: 247 | Disposition: A | Discharge status: Home / Self Care | Payer: Medicare Other | Attending: Internal Medicine | Admitting: Internal Medicine

## 2017-06-30 ENCOUNTER — Emergency Department: Payer: Medicare Other

## 2017-06-30 ENCOUNTER — Encounter
Admission: EM | Disposition: A | Discharge status: Home / Self Care | Payer: Self-pay | Source: Home / Self Care | Attending: Internal Medicine

## 2017-06-30 ENCOUNTER — Telehealth: Payer: Self-pay | Admitting: Family Medicine

## 2017-06-30 ENCOUNTER — Encounter: Payer: Self-pay | Admitting: Emergency Medicine

## 2017-06-30 DIAGNOSIS — R079 Chest pain, unspecified: Secondary | ICD-10-CM

## 2017-06-30 DIAGNOSIS — Z01810 Encounter for preprocedural cardiovascular examination: Secondary | ICD-10-CM | POA: Diagnosis not present

## 2017-06-30 DIAGNOSIS — I10 Essential (primary) hypertension: Secondary | ICD-10-CM | POA: Diagnosis present

## 2017-06-30 DIAGNOSIS — I214 Non-ST elevation (NSTEMI) myocardial infarction: Principal | ICD-10-CM | POA: Diagnosis present

## 2017-06-30 DIAGNOSIS — M797 Fibromyalgia: Secondary | ICD-10-CM | POA: Diagnosis present

## 2017-06-30 DIAGNOSIS — R7989 Other specified abnormal findings of blood chemistry: Secondary | ICD-10-CM

## 2017-06-30 DIAGNOSIS — G894 Chronic pain syndrome: Secondary | ICD-10-CM | POA: Diagnosis not present

## 2017-06-30 DIAGNOSIS — R739 Hyperglycemia, unspecified: Secondary | ICD-10-CM | POA: Diagnosis present

## 2017-06-30 DIAGNOSIS — R0789 Other chest pain: Secondary | ICD-10-CM | POA: Diagnosis not present

## 2017-06-30 DIAGNOSIS — F4310 Post-traumatic stress disorder, unspecified: Secondary | ICD-10-CM | POA: Diagnosis present

## 2017-06-30 DIAGNOSIS — M545 Low back pain: Secondary | ICD-10-CM | POA: Diagnosis present

## 2017-06-30 DIAGNOSIS — K219 Gastro-esophageal reflux disease without esophagitis: Secondary | ICD-10-CM | POA: Diagnosis not present

## 2017-06-30 DIAGNOSIS — G4700 Insomnia, unspecified: Secondary | ICD-10-CM | POA: Insufficient documentation

## 2017-06-30 DIAGNOSIS — Z79899 Other long term (current) drug therapy: Secondary | ICD-10-CM

## 2017-06-30 DIAGNOSIS — F17200 Nicotine dependence, unspecified, uncomplicated: Secondary | ICD-10-CM

## 2017-06-30 DIAGNOSIS — I340 Nonrheumatic mitral (valve) insufficiency: Secondary | ICD-10-CM | POA: Diagnosis not present

## 2017-06-30 DIAGNOSIS — E876 Hypokalemia: Secondary | ICD-10-CM | POA: Diagnosis not present

## 2017-06-30 DIAGNOSIS — R778 Other specified abnormalities of plasma proteins: Secondary | ICD-10-CM

## 2017-06-30 DIAGNOSIS — I25110 Atherosclerotic heart disease of native coronary artery with unstable angina pectoris: Secondary | ICD-10-CM

## 2017-06-30 DIAGNOSIS — F419 Anxiety disorder, unspecified: Secondary | ICD-10-CM | POA: Diagnosis present

## 2017-06-30 DIAGNOSIS — R112 Nausea with vomiting, unspecified: Secondary | ICD-10-CM | POA: Diagnosis not present

## 2017-06-30 DIAGNOSIS — F329 Major depressive disorder, single episode, unspecified: Secondary | ICD-10-CM | POA: Diagnosis present

## 2017-06-30 DIAGNOSIS — F17210 Nicotine dependence, cigarettes, uncomplicated: Secondary | ICD-10-CM | POA: Diagnosis present

## 2017-06-30 HISTORY — PX: CORONARY STENT INTERVENTION: CATH118234

## 2017-06-30 HISTORY — PX: LEFT HEART CATH AND CORONARY ANGIOGRAPHY: CATH118249

## 2017-06-30 LAB — BASIC METABOLIC PANEL
ANION GAP: 9 (ref 5–15)
BUN: 12 mg/dL (ref 6–20)
CHLORIDE: 103 mmol/L (ref 101–111)
CO2: 26 mmol/L (ref 22–32)
Calcium: 9.3 mg/dL (ref 8.9–10.3)
Creatinine, Ser: 0.74 mg/dL (ref 0.44–1.00)
GFR calc Af Amer: >60 mL/min (ref >60)
GLUCOSE: 107 mg/dL — AB (ref 65–99)
POTASSIUM: 3.4 mmol/L — AB (ref 3.5–5.1)
Sodium: 138 mmol/L (ref 135–145)

## 2017-06-30 LAB — CBC
HEMATOCRIT: 40.2 % (ref 35.0–47.0)
HEMOGLOBIN: 13.5 g/dL (ref 12.0–16.0)
MCH: 30.1 pg (ref 26.0–34.0)
MCHC: 33.6 g/dL (ref 32.0–36.0)
MCV: 89.6 fL (ref 80.0–100.0)
Platelets: 216 10*3/uL (ref 150–440)
RBC: 4.49 MIL/uL (ref 3.80–5.20)
RDW: 14.5 % (ref 11.5–14.5)
WBC: 5.3 10*3/uL (ref 3.6–11.0)

## 2017-06-30 LAB — MAGNESIUM: MAGNESIUM: 2 mg/dL (ref 1.7–2.4)

## 2017-06-30 LAB — POCT ACTIVATED CLOTTING TIME: ACTIVATED CLOTTING TIME: 549 s

## 2017-06-30 LAB — HEPARIN LEVEL (UNFRACTIONATED)

## 2017-06-30 LAB — TROPONIN I: Troponin I: 4.69 ng/mL (ref <0.03)

## 2017-06-30 LAB — APTT: APTT: 38 s — AB (ref 24–36)

## 2017-06-30 SURGERY — LEFT HEART CATH AND CORONARY ANGIOGRAPHY
Anesthesia: Moderate Sedation

## 2017-06-30 MED ORDER — PREGABALIN 50 MG PO CAPS
150.0000 mg | ORAL_CAPSULE | Freq: Two times a day (BID) | ORAL | Status: DC
  Administered 2017-06-30 – 2017-07-01 (×2): 150 mg via ORAL
  Filled 2017-06-30 (×2): qty 3

## 2017-06-30 MED ORDER — BIVALIRUDIN BOLUS VIA INFUSION - CUPID
INTRAVENOUS | Status: DC | PRN
  Administered 2017-06-30: 64.65 mg via INTRAVENOUS

## 2017-06-30 MED ORDER — TIROFIBAN (AGGRASTAT) BOLUS VIA INFUSION
INTRAVENOUS | Status: DC | PRN
  Administered 2017-06-30: 2155 ug via INTRAVENOUS

## 2017-06-30 MED ORDER — TIROFIBAN HCL IN NACL 5-0.9 MG/100ML-% IV SOLN
INTRAVENOUS | Status: AC | PRN
Start: 2017-06-30 — End: 2017-06-30
  Administered 2017-06-30: 0.15 ug/kg/min via INTRAVENOUS

## 2017-06-30 MED ORDER — NITROGLYCERIN 5 MG/ML IV SOLN
INTRAVENOUS | Status: AC
  Filled 2017-06-30: qty 10

## 2017-06-30 MED ORDER — FENTANYL CITRATE (PF) 100 MCG/2ML IJ SOLN
INTRAMUSCULAR | Status: DC | PRN
  Administered 2017-06-30 (×2): 25 ug via INTRAVENOUS

## 2017-06-30 MED ORDER — ATORVASTATIN CALCIUM 80 MG PO TABS
80.0000 mg | ORAL_TABLET | Freq: Every day | ORAL | Status: DC
  Administered 2017-06-30: 80 mg via ORAL
  Filled 2017-06-30: qty 1
  Filled 2017-06-30: qty 4
  Filled 2017-06-30: qty 1

## 2017-06-30 MED ORDER — MIDAZOLAM HCL 2 MG/2ML IJ SOLN
INTRAMUSCULAR | Status: AC
  Filled 2017-06-30: qty 2

## 2017-06-30 MED ORDER — SODIUM CHLORIDE 0.9 % WEIGHT BASED INFUSION
1.0000 mL/kg/h | INTRAVENOUS | Status: DC
  Administered 2017-06-30: 1 mL/kg/h via INTRAVENOUS

## 2017-06-30 MED ORDER — ASPIRIN 81 MG PO CHEW
CHEWABLE_TABLET | ORAL | Status: AC
  Filled 2017-06-30: qty 4

## 2017-06-30 MED ORDER — NITROGLYCERIN 2 % TD OINT
0.5000 [in_us] | TOPICAL_OINTMENT | Freq: Once | TRANSDERMAL | Status: AC
  Administered 2017-06-30: 0.5 [in_us] via TOPICAL
  Filled 2017-06-30: qty 1

## 2017-06-30 MED ORDER — BIVALIRUDIN TRIFLUOROACETATE 250 MG IV SOLR
INTRAVENOUS | Status: AC
  Filled 2017-06-30: qty 250

## 2017-06-30 MED ORDER — ASPIRIN 81 MG PO CHEW
324.0000 mg | CHEWABLE_TABLET | Freq: Once | ORAL | Status: AC
  Administered 2017-06-30: 324 mg via ORAL
  Filled 2017-06-30: qty 4

## 2017-06-30 MED ORDER — SODIUM CHLORIDE 0.9% FLUSH
3.0000 mL | Freq: Two times a day (BID) | INTRAVENOUS | Status: DC
  Administered 2017-06-30: 3 mL via INTRAVENOUS

## 2017-06-30 MED ORDER — TIROFIBAN (AGGRASTAT) BOLUS VIA INFUSION: INTRAVENOUS | Status: DC | PRN

## 2017-06-30 MED ORDER — CLOPIDOGREL BISULFATE 75 MG PO TABS
ORAL_TABLET | ORAL | Status: DC | PRN
  Administered 2017-06-30: 600 mg via ORAL

## 2017-06-30 MED ORDER — LABETALOL HCL 5 MG/ML IV SOLN: 10.0000 mg | INTRAVENOUS | Status: DC | PRN

## 2017-06-30 MED ORDER — SODIUM CHLORIDE 0.9 % WEIGHT BASED INFUSION: 3.0000 mL/kg/h | INTRAVENOUS | Status: DC

## 2017-06-30 MED ORDER — ONDANSETRON HCL 4 MG/2ML IJ SOLN
4.0000 mg | Freq: Four times a day (QID) | INTRAMUSCULAR | Status: DC | PRN
  Administered 2017-06-30: 4 mg via INTRAVENOUS

## 2017-06-30 MED ORDER — TIROFIBAN HCL IN NACL 5-0.9 MG/100ML-% IV SOLN
0.1500 ug/kg/min | INTRAVENOUS | Status: DC
  Administered 2017-06-30: 0.15 ug/kg/min via INTRAVENOUS
  Filled 2017-06-30 (×3): qty 100

## 2017-06-30 MED ORDER — SODIUM CHLORIDE 0.9% FLUSH: 3.0000 mL | INTRAVENOUS | Status: DC | PRN

## 2017-06-30 MED ORDER — HEPARIN (PORCINE) IN NACL 100-0.45 UNIT/ML-% IJ SOLN
1150.0000 [IU]/h | INTRAMUSCULAR | Status: DC
  Administered 2017-06-30: 1150 [IU]/h via INTRAVENOUS
  Filled 2017-06-30: qty 250
  Filled 2017-06-30: qty 3000

## 2017-06-30 MED ORDER — IOPAMIDOL (ISOVUE-300) INJECTION 61%
INTRAVENOUS | Status: DC | PRN
  Administered 2017-06-30: 120 mL via INTRA_ARTERIAL

## 2017-06-30 MED ORDER — SODIUM CHLORIDE 0.9 % WEIGHT BASED INFUSION: 1.0000 mL/kg/h | INTRAVENOUS | Status: DC

## 2017-06-30 MED ORDER — PROMETHAZINE HCL 25 MG/ML IJ SOLN: 12.5000 mg | Freq: Four times a day (QID) | INTRAMUSCULAR | Status: DC | PRN

## 2017-06-30 MED ORDER — MORPHINE SULFATE (PF) 2 MG/ML IV SOLN
2.0000 mg | Freq: Once | INTRAVENOUS | Status: AC
  Administered 2017-06-30: 2 mg via INTRAVENOUS

## 2017-06-30 MED ORDER — IOPAMIDOL (ISOVUE-300) INJECTION 61%
INTRAVENOUS | Status: DC | PRN
  Administered 2017-06-30: 80 mL via INTRA_ARTERIAL

## 2017-06-30 MED ORDER — PANTOPRAZOLE SODIUM 40 MG PO TBEC
40.0000 mg | DELAYED_RELEASE_TABLET | Freq: Every day | ORAL | Status: DC
  Administered 2017-07-01: 40 mg via ORAL
  Filled 2017-06-30: qty 1

## 2017-06-30 MED ORDER — FENTANYL CITRATE (PF) 100 MCG/2ML IJ SOLN
INTRAMUSCULAR | Status: AC
  Filled 2017-06-30: qty 2

## 2017-06-30 MED ORDER — SODIUM CHLORIDE 0.9 % IV SOLN
INTRAVENOUS | Status: DC | PRN
  Administered 2017-06-30: 1.75 mg/kg/h via INTRAVENOUS

## 2017-06-30 MED ORDER — ASPIRIN EC 81 MG PO TBEC
81.0000 mg | DELAYED_RELEASE_TABLET | Freq: Every day | ORAL | Status: DC
Start: 2017-07-01 — End: 2017-07-01
  Administered 2017-07-01: 81 mg via ORAL
  Filled 2017-06-30: qty 1

## 2017-06-30 MED ORDER — HEPARIN BOLUS VIA INFUSION
4000.0000 [IU] | Freq: Once | INTRAVENOUS | Status: AC
  Administered 2017-06-30: 4000 [IU] via INTRAVENOUS
  Filled 2017-06-30: qty 4000

## 2017-06-30 MED ORDER — METOPROLOL TARTRATE 25 MG PO TABS
25.0000 mg | ORAL_TABLET | Freq: Two times a day (BID) | ORAL | Status: DC
  Administered 2017-06-30: 25 mg via ORAL
  Filled 2017-06-30: qty 1

## 2017-06-30 MED ORDER — ONDANSETRON HCL 4 MG/2ML IJ SOLN: 4.0000 mg | Freq: Four times a day (QID) | INTRAMUSCULAR | Status: DC | PRN

## 2017-06-30 MED ORDER — MORPHINE SULFATE (PF) 2 MG/ML IV SOLN
INTRAVENOUS | Status: AC
  Filled 2017-06-30: qty 1

## 2017-06-30 MED ORDER — NITROGLYCERIN 1 MG/10 ML FOR IR/CATH LAB
INTRA_ARTERIAL | Status: DC | PRN
  Administered 2017-06-30: 200 ug via INTRACORONARY

## 2017-06-30 MED ORDER — DULOXETINE HCL 20 MG PO CPEP
20.0000 mg | ORAL_CAPSULE | Freq: Every day | ORAL | Status: DC
  Administered 2017-07-01: 20 mg via ORAL
  Filled 2017-06-30: qty 1

## 2017-06-30 MED ORDER — SODIUM CHLORIDE 0.9 % IV SOLN: 250.0000 mL | INTRAVENOUS | Status: DC | PRN

## 2017-06-30 MED ORDER — CLOPIDOGREL BISULFATE 300 MG PO TABS
ORAL_TABLET | ORAL | Status: AC
  Filled 2017-06-30: qty 2

## 2017-06-30 MED ORDER — POTASSIUM CHLORIDE CRYS ER 20 MEQ PO TBCR
30.0000 meq | EXTENDED_RELEASE_TABLET | Freq: Two times a day (BID) | ORAL | Status: AC
  Administered 2017-06-30 (×2): 30 meq via ORAL
  Filled 2017-06-30 (×2): qty 1

## 2017-06-30 MED ORDER — NITROGLYCERIN 0.4 MG SL SUBL
0.4000 mg | SUBLINGUAL_TABLET | SUBLINGUAL | Status: DC | PRN
  Administered 2017-06-30 (×2): 0.4 mg via SUBLINGUAL
  Filled 2017-06-30: qty 1

## 2017-06-30 MED ORDER — ONDANSETRON HCL 4 MG/2ML IJ SOLN
INTRAMUSCULAR | Status: AC
  Administered 2017-06-30: 4 mg via INTRAVENOUS
  Filled 2017-06-30: qty 2

## 2017-06-30 MED ORDER — ASPIRIN 81 MG PO CHEW: 81.0000 mg | CHEWABLE_TABLET | ORAL | Status: DC

## 2017-06-30 MED ORDER — TIROFIBAN HCL IV 12.5 MG/250 ML
INTRAVENOUS | Status: AC
  Filled 2017-06-30: qty 250

## 2017-06-30 MED ORDER — SODIUM CHLORIDE 0.9% FLUSH
3.0000 mL | Freq: Two times a day (BID) | INTRAVENOUS | Status: DC
  Administered 2017-07-01 (×2): 3 mL via INTRAVENOUS

## 2017-06-30 MED ORDER — FENTANYL CITRATE (PF) 100 MCG/2ML IJ SOLN
INTRAMUSCULAR | Status: DC | PRN
  Administered 2017-06-30: 25 ug via INTRAVENOUS

## 2017-06-30 MED ORDER — ACETAMINOPHEN 325 MG PO TABS
650.0000 mg | ORAL_TABLET | ORAL | Status: DC | PRN
  Administered 2017-06-30 – 2017-07-01 (×3): 650 mg via ORAL
  Filled 2017-06-30 (×3): qty 2

## 2017-06-30 MED ORDER — CLOPIDOGREL BISULFATE 75 MG PO TABS
75.0000 mg | ORAL_TABLET | Freq: Every day | ORAL | Status: DC
  Administered 2017-07-01: 75 mg via ORAL
  Filled 2017-06-30: qty 1

## 2017-06-30 MED ORDER — TIROFIBAN HCL IN NACL 5-0.9 MG/100ML-% IV SOLN
INTRAVENOUS | Status: AC
  Filled 2017-06-30: qty 100

## 2017-06-30 MED ORDER — MIDAZOLAM HCL 2 MG/2ML IJ SOLN
INTRAMUSCULAR | Status: DC | PRN
  Administered 2017-06-30 (×2): 1 mg via INTRAVENOUS

## 2017-06-30 MED ORDER — HYDRALAZINE HCL 20 MG/ML IJ SOLN: 5.0000 mg | INTRAMUSCULAR | Status: DC | PRN

## 2017-06-30 SURGICAL SUPPLY — 17 items
BALLN TREK RX 2.5X12 (BALLOONS) ×4
BALLOON TREK RX 2.5X12 (BALLOONS) IMPLANT
CATH 5FR JR4 DIAGNOSTIC (CATHETERS) ×1 IMPLANT
CATH INFINITI 5FR ANG PIGTAIL (CATHETERS) ×1 IMPLANT
CATH INFINITI 5FR JL4 (CATHETERS) ×1 IMPLANT
CATH VISTA GUIDE 6FR XB3.5 (CATHETERS) ×1 IMPLANT
DEVICE CLOSURE MYNXGRIP 6/7F (Vascular Products) ×1 IMPLANT
DEVICE INFLAT 30 PLUS (MISCELLANEOUS) ×2 IMPLANT
GUIDEWIRE 3MM J TIP .035 145 (WIRE) ×1 IMPLANT
KIT MANI 3VAL PERCEP (MISCELLANEOUS) ×2 IMPLANT
NDL PERC 18GX7CM (NEEDLE) IMPLANT
NEEDLE PERC 18GX7CM (NEEDLE) ×2 IMPLANT
PACK CARDIAC CATH (CUSTOM PROCEDURE TRAY) ×2 IMPLANT
SHEATH AVANTI 5FR X 11CM (SHEATH) ×1 IMPLANT
SHEATH AVANTI 6FR X 11CM (SHEATH) ×1 IMPLANT
STENT XIENCE ALPINE RX 2.5X12 (Permanent Stent) ×1 IMPLANT
WIRE ASAHI PROWATER 180CM (WIRE) ×2 IMPLANT

## 2017-06-30 NOTE — ED Notes (Signed)
Isaiah Blakes, RN notified that patient is ready for transport.

## 2017-06-30 NOTE — Progress Notes (Signed)
Patient returned to the unit form specials, alert and oriented, denies any pain at this time, vss, mynx closure right groin dressing dry and intact, aggrastat drip infusing, patient tolerating
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
## 2017-06-30 NOTE — Telephone Encounter (Signed)
Would like to know if what specifically patient would need psychiatry for so referral specialist can match patient up with correct specialist. Referral specialist will wait to know more about diagnosis before calling patient to schedule an appointment.  Please Advise.  Thank you

## 2017-06-30 NOTE — OR Nursing (Signed)
Call to Dr. Mariah Milling to let him know pt had a vagal response and or panic attack in recovery. She complained of pain 8-9/10 in her sides and back, nausea, vomited a large amount of water. She was given 4mg  of Zofran for nausea and that resolved. Dr. Lenard Galloway ordered 2-4 mg of Morphine IV PRN pain. At 1625 pt was feeling less nauseated, and was having no pain. She stated that she has panic attacks and she felt like that was what this was.  Her bed sheets were changed. Report was given to Pass Christian, Charity fundraiser at [phone]. Meds and orders were reviewed in report.
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
## 2017-06-30 NOTE — Telephone Encounter (Signed)
Notified of patient's current diagnoses.

## 2017-06-30 NOTE — ED Notes (Signed)
Admitting MD at bedside.

## 2017-06-30 NOTE — ED Notes (Signed)
Dr. Roxan Hockey in room to assess patient.

## 2017-06-30 NOTE — Assessment & Plan Note (Signed)
Will start meloxicam for her arthritis. Discussed that this is likely not the cause of most of her pain. Continue to monitor.

## 2017-06-30 NOTE — Assessment & Plan Note (Signed)
Under good control. Continue current regimen. Labs next visit.
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
## 2017-06-30 NOTE — Progress Notes (Signed)
Cath site to right groin is dry and intact with no redness, bruising or firmness noted. Old drainage noted on dressing.

## 2017-06-30 NOTE — Progress Notes (Signed)
Patient off the floor to special for cardiac cath at this time

## 2017-06-30 NOTE — Assessment & Plan Note (Signed)
Long discussion today regarding stopping cat naps, darkening room and doing something during the day rather than just laying on her bed. Will really work on not napping and increasing activity.

## 2017-06-30 NOTE — Assessment & Plan Note (Signed)
Not under good control. Will start omeprazole and recheck in 2 weeks. Call with any concerns.

## 2017-06-30 NOTE — Assessment & Plan Note (Signed)
Will start meloxicam for her arthritis. Discussed that this is likely not the cause of most of her pain. Continue to monitor.  

## 2017-06-30 NOTE — ED Notes (Signed)
Informed RN bed ready  [id number]

## 2017-06-30 NOTE — Assessment & Plan Note (Signed)
Long discussion of fibromyalgia today. Will add meloxicam for her arthritis in her neck. Will continue current regimen and recheck 2 weeks, amy need to adjust cymbalta at that time. Advised exercise- every day. Will get her into pain management and integrative medicine. Referrals generated today. Continue to monitor.

## 2017-06-30 NOTE — Assessment & Plan Note (Signed)
Seeing psychiatry on 07/15/17. Encouraged this.

## 2017-06-30 NOTE — Telephone Encounter (Signed)
Patient sent mychart message stating she was having chest pains. Please call and see if she is OK. Please let her know that mychart messages are only for NON-URGENT issues and are not checked outside business hours. If she has chest pain again, she will need to go to the ER. Thanks.

## 2017-06-30 NOTE — ED Triage Notes (Signed)
Saturday awoke with heart fluttering , left arm weakness/ numbness. Chest soreness and left arm/shoulder numbness continues today

## 2017-06-30 NOTE — Telephone Encounter (Signed)
Spoke with patient, she states that her chest is sore, her left arm and side is weak. Patient informed to go straight to the ER. Patient agreed. Patient also informed that if she has any other problems that are urgent to call the office or go to the ER since we only get my chart messages during business hours.
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
## 2017-06-30 NOTE — Assessment & Plan Note (Signed)
Long discussion of fibromyalgia today. Will add meloxicam for her arthritis in her neck. Will continue current regimen and recheck 2 weeks, amy need to adjust cymbalta at that time. Advised exercise- every day. Will get her into pain management and integrative medicine. Referrals generated today. Continue to monitor.  

## 2017-07-01 ENCOUNTER — Other Ambulatory Visit: Payer: Self-pay | Admitting: *Deleted

## 2017-07-01 ENCOUNTER — Encounter: Payer: Self-pay | Admitting: Cardiovascular Disease

## 2017-07-01 ENCOUNTER — Inpatient Hospital Stay (HOSPITAL_COMMUNITY)
Admit: 2017-07-01 | Discharge: 2017-07-01 | Disposition: A | Discharge status: Home / Self Care | Payer: Medicare Other | Attending: Physician Assistant | Admitting: Physician Assistant

## 2017-07-01 DIAGNOSIS — I340 Nonrheumatic mitral (valve) insufficiency: Secondary | ICD-10-CM

## 2017-07-01 LAB — BASIC METABOLIC PANEL
Anion gap: 6 (ref 5–15)
BUN: 13 mg/dL (ref 6–20)
CHLORIDE: 108 mmol/L (ref 101–111)
CO2: 26 mmol/L (ref 22–32)
Calcium: 8.6 mg/dL — ABNORMAL LOW (ref 8.9–10.3)
Creatinine, Ser: 0.73 mg/dL (ref 0.44–1.00)
GFR calc non Af Amer: >60 mL/min (ref >60)
Glucose, Bld: 100 mg/dL — ABNORMAL HIGH (ref 65–99)
POTASSIUM: 3.9 mmol/L (ref 3.5–5.1)
SODIUM: 140 mmol/L (ref 135–145)

## 2017-07-01 LAB — CBC
HEMATOCRIT: 32.9 % — AB (ref 35.0–47.0)
Hemoglobin: 11.3 g/dL — ABNORMAL LOW (ref 12.0–16.0)
MCH: 31.1 pg (ref 26.0–34.0)
MCHC: 34.3 g/dL (ref 32.0–36.0)
MCV: 90.9 fL (ref 80.0–100.0)
Platelets: 191 10*3/uL (ref 150–440)
RBC: 3.62 MIL/uL — AB (ref 3.80–5.20)
RDW: 14.2 % (ref 11.5–14.5)
WBC: 7.3 10*3/uL (ref 3.6–11.0)

## 2017-07-01 LAB — LIPID PANEL
CHOL/HDL RATIO: 4.1 ratio
CHOLESTEROL: 169 mg/dL (ref 0–200)
HDL: 41 mg/dL (ref >40)
LDL Cholesterol: 107 mg/dL — ABNORMAL HIGH (ref 0–99)
Triglycerides: 104 mg/dL (ref <150)
VLDL: 21 mg/dL (ref 0–40)

## 2017-07-01 LAB — HEMOGLOBIN A1C
HEMOGLOBIN A1C: 5.9 % — AB (ref 4.8–5.6)
Mean Plasma Glucose: 123 mg/dL

## 2017-07-01 LAB — ECHOCARDIOGRAM COMPLETE
HEIGHTINCHES: 69 in
WEIGHTICAEL: 3040 [oz_av]

## 2017-07-01 MED ORDER — ASPIRIN 81 MG PO TBEC: 81.0000 mg | DELAYED_RELEASE_TABLET | Freq: Every day | ORAL | 0 refills | Status: DC

## 2017-07-01 MED ORDER — CLOPIDOGREL BISULFATE 75 MG PO TABS: 75.0000 mg | ORAL_TABLET | Freq: Every day | ORAL | 0 refills | Status: DC

## 2017-07-01 MED ORDER — METOPROLOL TARTRATE 25 MG PO TABS: 25.0000 mg | ORAL_TABLET | Freq: Two times a day (BID) | ORAL | 0 refills | Status: DC

## 2017-07-01 MED ORDER — ALPRAZOLAM 0.5 MG PO TABS
0.5000 mg | ORAL_TABLET | Freq: Two times a day (BID) | ORAL | Status: DC | PRN
  Administered 2017-07-01: 0.5 mg via ORAL
  Filled 2017-07-01: qty 1

## 2017-07-01 MED ORDER — LISINOPRIL 5 MG PO TABS
5.0000 mg | ORAL_TABLET | Freq: Every day | ORAL | 0 refills | Status: DC
Start: 2017-07-01 — End: 2017-07-24

## 2017-07-01 MED ORDER — TRAMADOL HCL 50 MG PO TABS
50.0000 mg | ORAL_TABLET | Freq: Four times a day (QID) | ORAL | Status: DC | PRN
  Administered 2017-07-01: 50 mg via ORAL

## 2017-07-01 MED ORDER — TRAMADOL HCL 50 MG PO TABS
50.0000 mg | ORAL_TABLET | Freq: Four times a day (QID) | ORAL | Status: DC | PRN
  Filled 2017-07-01: qty 1

## 2017-07-01 MED ORDER — ATORVASTATIN CALCIUM 80 MG PO TABS: 80.0000 mg | ORAL_TABLET | Freq: Every day | ORAL | 0 refills | Status: DC

## 2017-07-01 MED ORDER — IBUPROFEN 200 MG PO TABS: 400.0000 mg | ORAL_TABLET | Freq: Three times a day (TID) | ORAL | 0 refills | Status: DC | PRN

## 2017-07-01 NOTE — Progress Notes (Signed)
Patient discharged home as oredered,vital signs within normal limit,instructions explained and well understood,prescriptions handed to patient,escorted by spouse and Water quality scientist via wheel chair.

## 2017-07-01 NOTE — Care Management (Signed)
Screen due to diagnosis of nstemi which resulted in  PCI. Appears is not being discharged home on cost prohibitive antiplatelet medication

## 2017-07-01 NOTE — Progress Notes (Signed)
Cath site to right groin continues to be  dry and  Intact with no redness or bruising noted. Old drainage noted on dressing.

## 2017-07-01 NOTE — Progress Notes (Signed)
Patient Name: Susan Macias Date of Encounter: 07/01/2017  Primary Cardiologist: New to [facility] - consult by [facility] Problem List     Principal Problem:   NSTEMI (non-ST elevated myocardial infarction) [facility]) Active Problems:   Hypertension   Chronic pain syndrome   Non-STEMI (non-ST elevated myocardial infarction) (HCC)     Subjective   No further chest pain. Asking for pain medication 2/2 right femoral cath site. Status post PCI/DES to diagonal. TTE pending. Post cath labs stable.   Inpatient Medications    Scheduled Meds: . aspirin EC  81 mg Oral Daily  . atorvastatin  80 mg Oral q1800  . clopidogrel  75 mg Oral Q breakfast  . DULoxetine  20 mg Oral Daily  . metoprolol tartrate  25 mg Oral BID  . pantoprazole  40 mg Oral Daily  . pregabalin  150 mg Oral BID  . sodium chloride flush  3 mL Intravenous Q12H   Continuous Infusions: . sodium chloride     PRN Meds: sodium chloride, acetaminophen, nitroGLYCERIN, ondansetron (ZOFRAN) IV, ondansetron (ZOFRAN) IV, promethazine, sodium chloride flush   Vital Signs    Vitals:   06/30/17 1630 06/30/17 1800 06/30/17 1959 07/01/17 0418  BP: (!) 121/91 104/71 93/67 109/76  Pulse:  78 80 62  Resp: 13 18 18 16   Temp:  97.9 F (36.6 C) (!) 97.5 F (36.4 C) 97.9 F (36.6 C)  TempSrc:  Oral Oral Oral  SpO2: 100% 100% 99% 100%  Weight:      Height:        Intake/Output Summary (Last 24 hours) at 07/01/17 0743 Last data filed at 07/01/17 0741  Gross per 24 hour  Intake           195.82 ml  Output                0 ml  Net           195.82 ml   Filed Weights   06/30/17 0955 06/30/17 1315  Weight: 190 lb (86.2 kg) 190 lb (86.2 kg)    Physical Exam    GEN: Well nourished, well developed, in no acute distress.  HEENT: Grossly normal.  Neck: Supple, no JVD, carotid bruits, or masses. Cardiac: RRR, no murmurs, rubs, or gallops. No clubbing, cyanosis, edema.  Radials/DP/PT 2+ and equal bilaterally. Right  femoral cath site without bleeding bruising, swelling, erythema, or warmth. No bruit.  Respiratory:  Respirations regular and unlabored, clear to auscultation bilaterally. GI: Soft, nontender, nondistended, BS + x 4. MS: no deformity or atrophy. Skin: warm and dry, no rash. Neuro:  Strength and sensation are intact. Psych: AAOx3.  Normal affect.  Labs    CBC  Recent Labs  06/30/17 1000 07/01/17 0428  WBC 5.3 7.3  HGB 13.5 11.3*  HCT 40.2 32.9*  MCV 89.6 90.9  PLT 216 191   Basic Metabolic Panel  Recent Labs  06/30/17 1000 07/01/17 0428  NA 138 140  K 3.4* 3.9  CL 103 108  CO2 26 26  GLUCOSE 107* 100*  BUN 12 13  CREATININE 0.74 0.73  CALCIUM 9.3 8.6*  MG 2.0  --    Liver Function Tests No results for input(s): AST, ALT, ALKPHOS, BILITOT, PROT, ALBUMIN in the last 72 hours. No results for input(s): LIPASE, AMYLASE in the last 72 hours. Cardiac Enzymes  Recent Labs  06/30/17 1000  TROPONINI 4.69*   BNP Invalid input(s): POCBNP D-Dimer No results for input(s): DDIMER  in the last 72 hours. Hemoglobin A1C  Recent Labs  06/30/17 1000  HGBA1C 5.9*   Fasting Lipid Panel  Recent Labs  07/01/17 0428  CHOL 169  HDL 41  LDLCALC 107*  TRIG 104  CHOLHDL 4.1   Thyroid Function Tests No results for input(s): TSH, T4TOTAL, T3FREE, THYROIDAB in the last 72 hours.  Invalid input(s): FREET3  Telemetry    NSR - Personally Reviewed  ECG    n/a - Personally Reviewed  Radiology    Dg Chest 2 View  Result Date: 06/30/2017 IMPRESSION: Coarse interstitial lung markings likely reflect the patient's smoking history. There is no alveolar pneumonia, CHF, nor other acute cardiopulmonary abnormality. Electronically Signed   By: David  [place] M.D.   On: 06/30/2017 10:20    Cardiac Studies   [facility] 06/30/2017: Cardiac Catheterization Procedure Note  Name: Susan Macias MRN: [id number] DOB: 09-10-1971  Procedure: Left Heart Cath, Selective Coronary  Angiography, LV angiography  Indication:  Susan Macias is a 46 y.o. female who is being seen today for the evaluation of NSTEMI at the request of Dr. Allena Katz, MD. Patient has a h/o HTN, ongoing tobacco abuse at 1-2 packs daily since age 25, GERD, and chronic pain who presented to [facility] on 7/30 with sudden onset of chest pain that began on 7/28. TNT around 5 Brought to the cardiac cath lab for NSTEMI, continued chest pain in the ER.  Sx started 2 days ago at 3 AM   Procedural details: The right groin was prepped, draped, and anesthetized with 1% lidocaine. Using modified Seldinger technique, a 5 French sheath was introduced into the right femoral artery. Standard Judkins catheters (JL 5, JR 5 and pigtail catheter) were used for coronary angiography and left ventriculography. Catheter exchanges were performed over a guidewire. There were no immediate procedural complications. The patient was transferred to the post catheterization recovery area for further monitoring.  Moderate sedation: I was Face to Face with the patient during this time: (code: [id number])   Procedural Findings:   Coronary angiography:  Coronary dominance: Right  Left mainstem: Large vessel that bifurcates into the LAD and left circumflex, no significant disease noted  Left anterior descending (LAD): Large vessel that extends to the apical region, diagonal branch 2 of moderate size, severe ostial diagonal #1 disease estimated at >95%  Left circumflex (LCx): Large vessel with OM branch 2, no significant disease noted  Right coronary artery (RCA): Right dominant vessel with PL and PDA, no significant disease noted  Left ventriculography: Left ventricular systolic function is normal, LVEF is estimated at 55-65%, there is no significant mitral regurgitation , no significant aortic valve stenosis  Final Conclusions:  Critical ostial diagonal #1 disease, culprit lesion Otherwise nonobstructive CAD  Recommendations:  Case  discussed with Dr. Cassie Freer, he will attempt intervention Smoking cessation recommended  PCI 06/30/2017: Conclusion     A STENT XIENCE ALPINE RX 2.5X12 drug eluting stent was successfully placed, and does not overlap previously placed stent.  Ost 1st Diag to 1st Diag lesion, 95 %stenosed.  Post intervention, there is a 0% residual stenosis.   1. DES ostium of first diagonal branch  Recommendations  1. Aggrastat drip overnight 2. Dual antiplatelet therapy uninterrupted for 1 year    TTE pending  Patient Profile     46 y.o. female with history of HTN, ongoing tobacco abuse at 1-2 packs daily since age 39, GERD, and chronic pain who presented to [facility] on 7/30 with a NSTEMI.   Assessment &  Plan    1. NSTEMI: -Currently, chest pain free -Initial troponin 4.69 -Status post PCI/DES to diagonal -Stop Aggrastat -Continue DAPT with ASA 81 mg and Plavix 75 mg daily -Importance of DAPT was discussed in detail -Metoprolol and Lipitor -Check TTE -Cardiac rehab -Ambulate today, possible discharge later today or 8/1  2. HTN: -Well controlled currently -Metoprolol as above  3. Hyperglycemia: -A1c 5.9, need outpatient follow up  4. Hypokalemia: -Repleted  5. Prolonged QTc: -Would avoid QT prolonging medications -Possibly in the setting of her chronic PTA medications  6. Fibromyalgia: -Asking for pain medication 2/2 right femoral cath site -Defer to IM  Signed, Eula Listen, PA-C [facility] Pager: [phone] 07/01/2017, 7:43 AM

## 2017-07-01 NOTE — Progress Notes (Signed)
*  PRELIMINARY RESULTS* Echocardiogram 2D Echocardiogram has been performed.  Cristela Blue 07/01/2017, 9:26 AM

## 2017-07-01 NOTE — Consult Note (Signed)
Patient referred to Cardiac Rehab Phase II.  Reviewed the program with patient and significant other. Patient interested in the program and eager to participate.    Appointment scheduled for July 07, 2017 at 10:30 a.m.  Orientation packet given to patient.  Patient to look over the packet this evening and CV/Pulmonary Nurse Navigator will check with patient tomorrow to see if patient has any questions.

## 2017-07-02 ENCOUNTER — Telehealth: Payer: Self-pay | Admitting: Family Medicine

## 2017-07-02 NOTE — Telephone Encounter (Signed)
Pt called and stated she was having tightness in her throat, problems swallowing and sweating and would like to know what if she could have some kind of medication. After speaking with Dr Laural Benes I advised the pt to go to the emergency room since she recently had a stint put in. She told me she would be going shortly and stated that she had someone to drive her to the hospital.

## 2017-07-02 NOTE — Telephone Encounter (Signed)
As below.  Thanks.

## 2017-07-02 NOTE — Telephone Encounter (Signed)
Routing to provider  

## 2017-07-03 ENCOUNTER — Observation Stay: Payer: Medicare Other

## 2017-07-03 ENCOUNTER — Other Ambulatory Visit: Payer: Self-pay

## 2017-07-03 ENCOUNTER — Emergency Department: Payer: Medicare Other

## 2017-07-03 ENCOUNTER — Encounter: Payer: Self-pay | Admitting: Emergency Medicine

## 2017-07-03 ENCOUNTER — Observation Stay
Admission: EM | Admit: 2017-07-03 | Discharge: 2017-07-04 | Disposition: A | Discharge status: Home / Self Care | Payer: Medicare Other | Attending: Internal Medicine | Admitting: Internal Medicine

## 2017-07-03 DIAGNOSIS — I252 Old myocardial infarction: Secondary | ICD-10-CM | POA: Diagnosis not present

## 2017-07-03 DIAGNOSIS — I2510 Atherosclerotic heart disease of native coronary artery without angina pectoris: Secondary | ICD-10-CM | POA: Diagnosis not present

## 2017-07-03 DIAGNOSIS — R0789 Other chest pain: Secondary | ICD-10-CM | POA: Diagnosis not present

## 2017-07-03 DIAGNOSIS — Z87891 Personal history of nicotine dependence: Secondary | ICD-10-CM | POA: Diagnosis not present

## 2017-07-03 DIAGNOSIS — R0602 Shortness of breath: Secondary | ICD-10-CM | POA: Diagnosis not present

## 2017-07-03 DIAGNOSIS — G8918 Other acute postprocedural pain: Secondary | ICD-10-CM | POA: Diagnosis not present

## 2017-07-03 DIAGNOSIS — G629 Polyneuropathy, unspecified: Secondary | ICD-10-CM | POA: Diagnosis not present

## 2017-07-03 DIAGNOSIS — Z7902 Long term (current) use of antithrombotics/antiplatelets: Secondary | ICD-10-CM | POA: Diagnosis not present

## 2017-07-03 DIAGNOSIS — I25118 Atherosclerotic heart disease of native coronary artery with other forms of angina pectoris: Secondary | ICD-10-CM | POA: Diagnosis not present

## 2017-07-03 DIAGNOSIS — Z79899 Other long term (current) drug therapy: Secondary | ICD-10-CM | POA: Diagnosis not present

## 2017-07-03 DIAGNOSIS — R1031 Right lower quadrant pain: Secondary | ICD-10-CM

## 2017-07-03 DIAGNOSIS — F329 Major depressive disorder, single episode, unspecified: Secondary | ICD-10-CM | POA: Insufficient documentation

## 2017-07-03 DIAGNOSIS — M797 Fibromyalgia: Secondary | ICD-10-CM | POA: Insufficient documentation

## 2017-07-03 DIAGNOSIS — I10 Essential (primary) hypertension: Secondary | ICD-10-CM | POA: Diagnosis not present

## 2017-07-03 DIAGNOSIS — R079 Chest pain, unspecified: Secondary | ICD-10-CM | POA: Diagnosis not present

## 2017-07-03 DIAGNOSIS — F4310 Post-traumatic stress disorder, unspecified: Secondary | ICD-10-CM | POA: Insufficient documentation

## 2017-07-03 DIAGNOSIS — E785 Hyperlipidemia, unspecified: Secondary | ICD-10-CM | POA: Diagnosis not present

## 2017-07-03 DIAGNOSIS — E559 Vitamin D deficiency, unspecified: Secondary | ICD-10-CM | POA: Diagnosis not present

## 2017-07-03 DIAGNOSIS — F419 Anxiety disorder, unspecified: Secondary | ICD-10-CM

## 2017-07-03 DIAGNOSIS — G5600 Carpal tunnel syndrome, unspecified upper limb: Secondary | ICD-10-CM | POA: Diagnosis not present

## 2017-07-03 DIAGNOSIS — T148XXA Other injury of unspecified body region, initial encounter: Secondary | ICD-10-CM

## 2017-07-03 DIAGNOSIS — E782 Mixed hyperlipidemia: Secondary | ICD-10-CM | POA: Diagnosis not present

## 2017-07-03 DIAGNOSIS — Z955 Presence of coronary angioplasty implant and graft: Secondary | ICD-10-CM | POA: Diagnosis not present

## 2017-07-03 DIAGNOSIS — R1030 Lower abdominal pain, unspecified: Secondary | ICD-10-CM | POA: Diagnosis not present

## 2017-07-03 DIAGNOSIS — G894 Chronic pain syndrome: Secondary | ICD-10-CM | POA: Insufficient documentation

## 2017-07-03 DIAGNOSIS — E119 Type 2 diabetes mellitus without complications: Secondary | ICD-10-CM | POA: Diagnosis not present

## 2017-07-03 DIAGNOSIS — K219 Gastro-esophageal reflux disease without esophagitis: Secondary | ICD-10-CM | POA: Insufficient documentation

## 2017-07-03 DIAGNOSIS — Z886 Allergy status to analgesic agent status: Secondary | ICD-10-CM | POA: Insufficient documentation

## 2017-07-03 DIAGNOSIS — Z9889 Other specified postprocedural states: Secondary | ICD-10-CM

## 2017-07-03 DIAGNOSIS — Z7982 Long term (current) use of aspirin: Secondary | ICD-10-CM | POA: Diagnosis not present

## 2017-07-03 HISTORY — DX: Tobacco use: Z72.0

## 2017-07-03 HISTORY — DX: Atherosclerotic heart disease of native coronary artery without angina pectoris: I25.10

## 2017-07-03 HISTORY — DX: Other ill-defined heart diseases: I51.89

## 2017-07-03 LAB — BASIC METABOLIC PANEL
Anion gap: 6 (ref 5–15)
BUN: 14 mg/dL (ref 6–20)
CALCIUM: 9 mg/dL (ref 8.9–10.3)
CHLORIDE: 106 mmol/L (ref 101–111)
CO2: 27 mmol/L (ref 22–32)
Creatinine, Ser: 0.65 mg/dL (ref 0.44–1.00)
Glucose, Bld: 101 mg/dL — ABNORMAL HIGH (ref 65–99)
Potassium: 4 mmol/L (ref 3.5–5.1)
SODIUM: 139 mmol/L (ref 135–145)

## 2017-07-03 LAB — PROTIME-INR
INR: 1.03
Prothrombin Time: 13.5 seconds (ref 11.4–15.2)

## 2017-07-03 LAB — CBC
HEMATOCRIT: 30.2 % — AB (ref 35.0–47.0)
Hemoglobin: 10.3 g/dL — ABNORMAL LOW (ref 12.0–16.0)
MCH: 30.6 pg (ref 26.0–34.0)
MCHC: 34 g/dL (ref 32.0–36.0)
MCV: 89.9 fL (ref 80.0–100.0)
Platelets: 195 10*3/uL (ref 150–440)
RBC: 3.36 MIL/uL — ABNORMAL LOW (ref 3.80–5.20)
RDW: 14.4 % (ref 11.5–14.5)
WBC: 6.4 10*3/uL (ref 3.6–11.0)

## 2017-07-03 LAB — TROPONIN I
TROPONIN I: 1.32 ng/mL — AB (ref <0.03)
Troponin I: 1.51 ng/mL (ref <0.03)
Troponin I: 1.44 ng/mL (ref <0.03)
Troponin I: 1.44 ng/mL (ref <0.03)

## 2017-07-03 LAB — APTT: APTT: 115 s — AB (ref 24–36)

## 2017-07-03 LAB — HEPARIN LEVEL (UNFRACTIONATED): HEPARIN UNFRACTIONATED: 0.99 [IU]/mL — AB (ref 0.30–0.70)

## 2017-07-03 MED ORDER — ATORVASTATIN CALCIUM 20 MG PO TABS
80.0000 mg | ORAL_TABLET | Freq: Every day | ORAL | Status: DC
  Administered 2017-07-03: 80 mg via ORAL
  Filled 2017-07-03: qty 4

## 2017-07-03 MED ORDER — LISINOPRIL 5 MG PO TABS
5.0000 mg | ORAL_TABLET | Freq: Every day | ORAL | Status: DC
Start: 2017-07-03 — End: 2017-07-04
  Administered 2017-07-03 – 2017-07-04 (×2): 5 mg via ORAL
  Filled 2017-07-03 (×2): qty 1

## 2017-07-03 MED ORDER — IBUPROFEN 400 MG PO TABS: 400.0000 mg | ORAL_TABLET | Freq: Three times a day (TID) | ORAL | Status: DC | PRN

## 2017-07-03 MED ORDER — MELATONIN 5 MG PO TABS
5.0000 mg | ORAL_TABLET | Freq: Every day | ORAL | Status: DC
  Administered 2017-07-03: 5 mg via ORAL
  Filled 2017-07-03 (×2): qty 1

## 2017-07-03 MED ORDER — HEPARIN (PORCINE) IN NACL 100-0.45 UNIT/ML-% IJ SOLN
950.0000 [IU]/h | INTRAMUSCULAR | Status: DC
  Administered 2017-07-03: 1100 [IU]/h via INTRAVENOUS
  Filled 2017-07-03: qty 250

## 2017-07-03 MED ORDER — ACETAMINOPHEN 325 MG PO TABS: 650.0000 mg | ORAL_TABLET | Freq: Four times a day (QID) | ORAL | Status: DC | PRN

## 2017-07-03 MED ORDER — ONDANSETRON HCL 4 MG/2ML IJ SOLN: 4.0000 mg | Freq: Four times a day (QID) | INTRAMUSCULAR | Status: DC | PRN

## 2017-07-03 MED ORDER — PREGABALIN 50 MG PO CAPS
150.0000 mg | ORAL_CAPSULE | Freq: Two times a day (BID) | ORAL | Status: DC
  Administered 2017-07-03 – 2017-07-04 (×3): 150 mg via ORAL
  Filled 2017-07-03 (×3): qty 3

## 2017-07-03 MED ORDER — METOPROLOL TARTRATE 25 MG PO TABS
25.0000 mg | ORAL_TABLET | Freq: Two times a day (BID) | ORAL | Status: DC
  Administered 2017-07-03 – 2017-07-04 (×3): 25 mg via ORAL
  Filled 2017-07-03 (×3): qty 1

## 2017-07-03 MED ORDER — PANTOPRAZOLE SODIUM 40 MG PO TBEC
40.0000 mg | DELAYED_RELEASE_TABLET | Freq: Every day | ORAL | Status: DC
  Administered 2017-07-03 – 2017-07-04 (×2): 40 mg via ORAL
  Filled 2017-07-03 (×2): qty 1

## 2017-07-03 MED ORDER — CLOPIDOGREL BISULFATE 75 MG PO TABS
75.0000 mg | ORAL_TABLET | Freq: Every day | ORAL | Status: DC
  Administered 2017-07-04: 75 mg via ORAL
  Filled 2017-07-03: qty 1

## 2017-07-03 MED ORDER — ONDANSETRON HCL 4 MG PO TABS: 4.0000 mg | ORAL_TABLET | Freq: Four times a day (QID) | ORAL | Status: DC | PRN

## 2017-07-03 MED ORDER — NITROGLYCERIN IN D5W 200-5 MCG/ML-% IV SOLN
0.0000 ug/min | Freq: Once | INTRAVENOUS | Status: AC
  Administered 2017-07-03: 5 ug/min via INTRAVENOUS
  Filled 2017-07-03: qty 250

## 2017-07-03 MED ORDER — ASPIRIN 81 MG PO CHEW
243.0000 mg | CHEWABLE_TABLET | Freq: Once | ORAL | Status: AC
  Administered 2017-07-03: 243 mg via ORAL
  Filled 2017-07-03: qty 3

## 2017-07-03 MED ORDER — ACETAMINOPHEN 650 MG RE SUPP: 650.0000 mg | Freq: Four times a day (QID) | RECTAL | Status: DC | PRN

## 2017-07-03 MED ORDER — MELOXICAM 7.5 MG PO TABS
7.5000 mg | ORAL_TABLET | Freq: Every day | ORAL | Status: DC
  Administered 2017-07-03 – 2017-07-04 (×2): 7.5 mg via ORAL
  Filled 2017-07-03 (×2): qty 1

## 2017-07-03 MED ORDER — SODIUM CHLORIDE 0.9 % IV BOLUS (SEPSIS)
1000.0000 mL | Freq: Once | INTRAVENOUS | Status: AC
  Administered 2017-07-03: 1000 mL via INTRAVENOUS

## 2017-07-03 MED ORDER — ASPIRIN EC 81 MG PO TBEC
81.0000 mg | DELAYED_RELEASE_TABLET | Freq: Every day | ORAL | Status: DC
  Administered 2017-07-03 – 2017-07-04 (×2): 81 mg via ORAL
  Filled 2017-07-03 (×3): qty 1

## 2017-07-03 MED ORDER — DULOXETINE HCL 20 MG PO CPEP
20.0000 mg | ORAL_CAPSULE | Freq: Every day | ORAL | Status: DC
  Administered 2017-07-03 – 2017-07-04 (×2): 20 mg via ORAL
  Filled 2017-07-03 (×2): qty 1

## 2017-07-03 NOTE — ED Triage Notes (Addendum)
Pt c/o left sided chest pain/fluttering feeling that started last night. reports heart attack Saturday with stent on Monday.  Also has SHOB. Unlabored currently.  Skin warm and dry.  Pain is intermittent. Also c/o blood to sheath site in right groin would like checked.

## 2017-07-03 NOTE — Care Management (Signed)
Rcent discharge from [facility] after nstemi- with PCI.  Presents back with swelling in groin from cath site and chest pain.  Placed in observation. Cardiology consulting

## 2017-07-03 NOTE — ED Notes (Signed)
Dr. Don Perking was notified of pt's Troponin of 1.32
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
## 2017-07-03 NOTE — ED Notes (Signed)
Patient transported to X-ray 

## 2017-07-03 NOTE — ED Triage Notes (Signed)
First Nurse Note:  Arrives with c/o chest pain.  Initially started last night.  Patient just released from hospital following cardiac cath and stent placement on Tuesday.  Aslo c/o bleeding to right groin dressing / cath site.

## 2017-07-03 NOTE — ED Notes (Signed)
Pt stating that she was dx

## 2017-07-03 NOTE — ED Notes (Addendum)
Pt stating that she began with some palpitations and chest pressure yesterday evening. Pt stating that she was able to sleep but that this morning the pressure/tightness had increased. Pt stating that she was also diaphoretic. Pt appears diaphoretic on arrival. Pt stating that she "feels like a panic attack." Pt has wound to right groin from previous cardiac cath this Monday. Pt's dressing is barely intact and is wet at this time. Pt has family at bedside. EKG from triage was given to Dr. Don Perking.

## 2017-07-04 DIAGNOSIS — R1031 Right lower quadrant pain: Secondary | ICD-10-CM | POA: Diagnosis not present

## 2017-07-04 DIAGNOSIS — E119 Type 2 diabetes mellitus without complications: Secondary | ICD-10-CM

## 2017-07-04 DIAGNOSIS — R0789 Other chest pain: Secondary | ICD-10-CM | POA: Diagnosis not present

## 2017-07-04 DIAGNOSIS — R079 Chest pain, unspecified: Secondary | ICD-10-CM | POA: Diagnosis not present

## 2017-07-04 DIAGNOSIS — K219 Gastro-esophageal reflux disease without esophagitis: Secondary | ICD-10-CM | POA: Diagnosis not present

## 2017-07-04 DIAGNOSIS — E782 Mixed hyperlipidemia: Secondary | ICD-10-CM

## 2017-07-04 DIAGNOSIS — F17200 Nicotine dependence, unspecified, uncomplicated: Secondary | ICD-10-CM

## 2017-07-04 DIAGNOSIS — Z955 Presence of coronary angioplasty implant and graft: Secondary | ICD-10-CM | POA: Diagnosis not present

## 2017-07-04 DIAGNOSIS — G629 Polyneuropathy, unspecified: Secondary | ICD-10-CM | POA: Diagnosis not present

## 2017-07-04 DIAGNOSIS — I25118 Atherosclerotic heart disease of native coronary artery with other forms of angina pectoris: Secondary | ICD-10-CM | POA: Diagnosis not present

## 2017-07-04 LAB — CBC
HEMATOCRIT: 27.9 % — AB (ref 35.0–47.0)
HEMOGLOBIN: 9.7 g/dL — AB (ref 12.0–16.0)
MCH: 31.4 pg (ref 26.0–34.0)
MCHC: 34.6 g/dL (ref 32.0–36.0)
MCV: 90.6 fL (ref 80.0–100.0)
PLATELETS: 177 10*3/uL (ref 150–440)
RBC: 3.08 MIL/uL — AB (ref 3.80–5.20)
RDW: 14.4 % (ref 11.5–14.5)
WBC: 7 10*3/uL (ref 3.6–11.0)

## 2017-07-04 LAB — HEPARIN LEVEL (UNFRACTIONATED)
Heparin Unfractionated: 1.19 IU/mL — ABNORMAL HIGH (ref 0.30–0.70)
Heparin Unfractionated: 0.99 IU/mL — ABNORMAL HIGH (ref 0.30–0.70)

## 2017-07-04 LAB — HIV ANTIBODY (ROUTINE TESTING W REFLEX): HIV Screen 4th Generation wRfx: NONREACTIVE

## 2017-07-04 MED ORDER — NITROGLYCERIN 0.4 MG SL SUBL: 0.4000 mg | SUBLINGUAL_TABLET | SUBLINGUAL | Status: DC | PRN

## 2017-07-04 MED ORDER — NITROGLYCERIN 0.4 MG SL SUBL: 0.4000 mg | SUBLINGUAL_TABLET | SUBLINGUAL | 12 refills | Status: DC | PRN

## 2017-07-04 MED ORDER — ALPRAZOLAM 0.25 MG PO TABS
0.2500 mg | ORAL_TABLET | Freq: Once | ORAL | Status: AC
  Administered 2017-07-04: 0.25 mg via ORAL
  Filled 2017-07-04: qty 1

## 2017-07-04 MED ORDER — HEPARIN (PORCINE) IN NACL 100-0.45 UNIT/ML-% IJ SOLN: 600.0000 [IU]/h | INTRAMUSCULAR | Status: DC

## 2017-07-04 MED ORDER — HEPARIN (PORCINE) IN NACL 100-0.45 UNIT/ML-% IJ SOLN: 700.0000 [IU]/h | INTRAMUSCULAR | Status: DC

## 2017-07-04 MED ORDER — ALUM & MAG HYDROXIDE-SIMETH 200-200-20 MG/5ML PO SUSP
30.0000 mL | Freq: Four times a day (QID) | ORAL | Status: DC | PRN
  Administered 2017-07-04: 30 mL via ORAL
  Filled 2017-07-04: qty 30

## 2017-07-04 NOTE — Plan of Care (Signed)
Problem: Cardiac: Goal: Ability to achieve and maintain adequate cardiovascular perfusion will improve Outcome: Progressing Pain free this shift.  Old right groin puncture site without bleed/hematoma.
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
## 2017-07-04 NOTE — Progress Notes (Addendum)
Progress Note  Patient Name: Susan Macias Date of Encounter: 07/04/2017  Primary Cardiologist: [facility]  Subjective   She reports pain has entirely resolved, no further chest pain, shoulder pain, numbness tingling, overall feels great Right groin pain has entirely resolved She has been ambulating without difficult  Inpatient Medications    No current facility-administered medications on file prior to encounter.    Current Outpatient Prescriptions on File Prior to Encounter  Medication Sig Dispense Refill  . aspirin EC 81 MG EC tablet Take 1 tablet (81 mg total) by mouth daily. 30 tablet 0  . atorvastatin (LIPITOR) 80 MG tablet Take 1 tablet (80 mg total) by mouth daily at 6 PM. 30 tablet 0  . clopidogrel (PLAVIX) 75 MG tablet Take 1 tablet (75 mg total) by mouth daily with breakfast. 30 tablet 0  . DULoxetine (CYMBALTA) 20 MG capsule Take 1 capsule (20 mg total) by mouth daily. 30 capsule 3  . ibuprofen (ADVIL,MOTRIN) 200 MG tablet Take 2 tablets (400 mg total) by mouth every 8 (eight) hours as needed for moderate pain. 30 tablet 0  . lisinopril (PRINIVIL,ZESTRIL) 5 MG tablet Take 1 tablet (5 mg total) by mouth daily. 30 tablet 0  . meloxicam (MOBIC) 7.5 MG tablet Take 1 tablet (7.5 mg total) by mouth daily. 30 tablet 3  . metoprolol tartrate (LOPRESSOR) 25 MG tablet Take 1 tablet (25 mg total) by mouth 2 (two) times daily. 60 tablet 0  . omeprazole (PRILOSEC) 20 MG capsule Take 1 capsule (20 mg total) by mouth daily. 30 capsule 3  . pregabalin (LYRICA) 150 MG capsule Take 1 capsule (150 mg total) by mouth 2 (two) times daily. 60 capsule 1  . [DISCONTINUED] gabapentin (NEURONTIN) 300 MG capsule Take 300 mg by mouth 3 (three) times daily.      Vital Signs    Vitals:   07/03/17 1927 07/04/17 0351 07/04/17 0646 07/04/17 1306  BP: 111/78 (!) 88/50 121/81 122/75  Pulse: 77 63 69 70  Resp: 18 17  14   Temp: 98.1 F (36.7 C) 97.9 F (36.6 C)    TempSrc: Oral Oral    SpO2: 100%  100% 100% 100%  Weight:      Height:        Intake/Output Summary (Last 24 hours) at 07/04/17 1953 Last data filed at 07/04/17 1333  Gross per 24 hour  Intake           416.56 ml  Output              150 ml  Net           266.56 ml   Filed Weights   07/03/17 0909 07/03/17 0918 07/03/17 1317  Weight: 190 lb (86.2 kg) 190 lb (86.2 kg) 196 lb 6.4 oz (89.1 kg)    Telemetry    Normal sinus rhythm - Personally Reviewed  ECG      Physical Exam   GEN No acute distress.   Neck: No JVD Cardiac: RRR, no murmurs, rubs, or gallops.  Respiratory: Clear to auscultation bilaterally. GI: Soft, nontender, non-distended  MS: No edema; No deformity. Neuro:  Nonfocal  Psych: Normal affect   Labs    Chemistry Recent Labs Lab 06/30/17 1000 07/01/17 0428 07/03/17 0917  NA 138 140 139  K 3.4* 3.9 4.0  CL 103 108 106  CO2 26 26 27   GLUCOSE 107* 100* 101*  BUN 12 13 14   CREATININE 0.74 0.73 0.65  CALCIUM 9.3 8.6* 9.0  GFRNONAA >60 >60 >60  GFRAA >60 >60 >60  ANIONGAP 9 6 6      Hematology Recent Labs Lab 07/01/17 0428 07/03/17 0917 07/04/17 0058  WBC 7.3 6.4 7.0  RBC 3.62* 3.36* 3.08*  HGB 11.3* 10.3* 9.7*  HCT 32.9* 30.2* 27.9*  MCV 90.9 89.9 90.6  MCH 31.1 30.6 31.4  MCHC 34.3 34.0 34.6  RDW 14.2 14.4 14.4  PLT 191 195 177    Cardiac Enzymes Recent Labs Lab 07/03/17 0917 07/03/17 1332 07/03/17 1704 07/03/17 2103  TROPONINI 1.32* 1.51* 1.44* 1.44*   No results for input(s): TROPIPOC in the last 168 hours.   BNPNo results for input(s): BNP, PROBNP in the last 168 hours.   DDimer No results for input(s): DDIMER in the last 168 hours.   Radiology    Dg Chest 2 View  Result Date: 07/03/2017 CLINICAL DATA:  Chest pain and shortness of breath EXAM: CHEST  2 VIEW COMPARISON:  June 30, 2017 FINDINGS: There is no edema or consolidation. Areas of slight parenchymal scarring are stable. Heart size and pulmonary vascularity are normal. No adenopathy. No evident  bone lesions. No pneumothorax. IMPRESSION: Areas of slight parenchymal scarring. No edema or consolidation. Stable cardiac silhouette. Electronically Signed   By: Bretta Bang III M.D.   On: 07/03/2017 09:57   [place] Lower Ext Art Right Ltd  Result Date: 07/03/2017 CLINICAL DATA:  46 year old female with right groin pain following cardiac catheterization. Evaluate for pseudoaneurysm or arteriovenous fistula. EXAM: UNILATERAL RIGHT LOWER EXTREMITY ARTERIAL DUPLEX SCAN TECHNIQUE: Gray-scale sonography as well as color Doppler and duplex ultrasound was performed to evaluate the arteries of the lower extremity. COMPARISON:  None. FINDINGS: Sonographic evaluation of the right femoral vessels demonstrates a normal appearing common femoral artery. No evidence of overlying hematoma, pseudoaneurysm or arteriovenous fistula. The adjacent common femoral vein is also normal in appearance. IMPRESSION: No evidence of hematoma, pseudoaneurysm or arteriovenous fistula. Electronically Signed   By: Malachy Moan M.D.   On: 07/03/2017 16:34    Cardiac Studies     Patient Profile     46 year old woman with coronary artery disease, recent hospital admission for non-STEMI, stent placement to her ostial diagonal branch who presents with discomfort in her right groin, chest pain radiating to her right shoulder, jaw, numbness  Assessment & Plan    --- Chest pain Atypical in nature, troponin  trending downward From event last week Suspect presentation secondary to anxiety She would benefit from cardiac rehabilitation to avoid repeat emergency room visits Would continue current outpatient medications Stressed importance of staying on her antiplatelet medication  --- Right groin swelling/hematoma Interestingly hematoma was appreciated on my exam on admission in the ER No hematoma was noted on ultrasound of right groin She reports pain has entirely resolved  --- Hypertension Blood pressure stable, continue  outpatient regiment  ----Smoker Reports that she does not smoke since recent discharge Recommended smoking cessation  --- Hyperlipidemia Continue statin, goal LDL less than 70    Total encounter time more than 25 minutes  Greater than 50% was spent in counseling and coordination of care with the patient   Signed, Julien Nordmann, MD  07/04/2017, 7:53 PM
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
## 2017-07-04 NOTE — Progress Notes (Signed)
MD notified. Pt complains of arm pain and chest tightness. MD notified. Order for one dose of xanax received. I will continue to assess.
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
## 2017-07-04 NOTE — Plan of Care (Signed)
Problem: Safety: Goal: Ability to remain free from injury will improve Outcome: Progressing Pt is a moderate fall risk. RN monitored patient while ambulating and patient is steady on her feet. I will encourage patient to call for assistance prn with activity.

## 2017-07-04 NOTE — Consult Note (Signed)
Provided patient with booklet, Bouncing Back from Heart Attack.  Talked with patient about Heart Healthy diet which she states she has been following.  Patient also stated she has not smoked a cigarette since last Tuesday, the day she was admitted with NSTEMI.  She states she has no desire to smoke.  She also stated she wants to protect her heart going forward.  She verbalized that she has anxiety and panic attacks and will be following up with her Behavioral Health Counselor next week. She reported her faith and relationship with God are a source of strength for her.  Patient stated the physician is going to prescribe NTG for her to take if needed at home.  Reviewed with patient NTG and instructed her to call 911 when taking the second NTG if the first did not relieve her chest pain.  Patient verbalized understanding. Spoke with patient about Cardiac Rehab appointment scheduled for Monday, July 07, 2017 at 10:30 a.m.  Patient planning on keeping this appointment and is eager to start.    11:00 a.m.  - 11:20 a.m.    Army Melia, RN, BSN, [facility] Cardiovascular and Pulmonary Nurse Navigator

## 2017-07-04 NOTE — Discharge Instructions (Signed)
F/u on your scheduled appt for cardiac rehab

## 2017-07-04 NOTE — Care Management Obs Status (Signed)
MEDICARE OBSERVATION STATUS NOTIFICATION   Patient Details  Name: Susan Macias MRN: [id number] Date of Birth: Mar 01, 1971   Medicare Observation Status Notification Given:  Yes Notice signed, one given to patient and the other to HIM for scanning   Eber Hong, RN 07/04/2017, 10:27 AM

## 2017-07-07 ENCOUNTER — Ambulatory Visit: Payer: Medicare Other

## 2017-07-11 ENCOUNTER — Encounter: Payer: Self-pay | Admitting: Family Medicine

## 2017-07-11 ENCOUNTER — Ambulatory Visit (INDEPENDENT_AMBULATORY_CARE_PROVIDER_SITE_OTHER): Payer: Medicare Other | Admitting: Family Medicine

## 2017-07-11 DIAGNOSIS — F331 Major depressive disorder, recurrent, moderate: Secondary | ICD-10-CM

## 2017-07-11 DIAGNOSIS — I214 Non-ST elevation (NSTEMI) myocardial infarction: Secondary | ICD-10-CM | POA: Diagnosis not present

## 2017-07-11 DIAGNOSIS — E782 Mixed hyperlipidemia: Secondary | ICD-10-CM | POA: Diagnosis not present

## 2017-07-11 DIAGNOSIS — M797 Fibromyalgia: Secondary | ICD-10-CM

## 2017-07-11 MED ORDER — HYDROXYZINE HCL 25 MG PO TABS: 25.0000 mg | ORAL_TABLET | Freq: Three times a day (TID) | ORAL | 0 refills | Status: DC | PRN

## 2017-07-11 NOTE — Progress Notes (Signed)
BP 115/80 (BP Location: Left Arm, Patient Position: Sitting, Cuff Size: Normal)   Pulse 80   Temp 98.3 F (36.8 C)   Wt 185 lb 2 oz (84 kg)   SpO2 100%   BMI 27.34 kg/m    Subjective:    Patient ID: Susan Macias, female    DOB: Mar 14, 1971, 46 y.o.   MRN: [id number]  HPI: Susan Macias is a 46 y.o. female  Chief Complaint  Patient presents with  . Hospitalization Follow-up  . Referral    [facility] [phone]  . Other    Appointments scheduled: Cardiology on Monday, CBC on Tuesday and Cardiac rehab on 07/21/17  . Anxiety  . Fibromyalgia    Patient states that she is having a flare.    HOSPITAL FOLLOW UP x2 Time since discharge: 10 days, then 7 days Hospital/facility: [facility] Diagnosis: NSTEMI, panic attack Procedures/tests: Cath with stent placement in the first diagnonal branch Consultants: cardiology New medications: aspirin, atorvastatin, plavix, lisinopril 5, metoprolol 25 Discharge instructions:  Follow up with cardiology and here Status: better  Would like to have a physical.   She notes that she is still having panic attacks. She is afraid of eating any grease. She notes that she is being really careful about what she eats and is being much more careful in general. She is feeling good since her heart attack and feels like it was a wake up call. She notes that she is seeing cardiology on Monday and seeing psychiatry on Tuesday. She is happy to be seeing all these people. She notes that the integrative health couldn't see her. But she would like to see [facility] pain management instead of [facility]. She is pretty achey throughout and feels like she is having a fibro flare. She has been very anxious and having some trouble sleeping. She is otherwise doing OK with no other concerns or complaints at this time.   Relevant past medical, surgical, family and social history reviewed and updated as indicated. Interim medical history since our last visit reviewed. Allergies and  medications reviewed and updated.  Review of Systems  Constitutional: Negative.   Respiratory: Negative.   Cardiovascular: Negative.   Musculoskeletal: Positive for arthralgias, back pain and myalgias. Negative for gait problem, joint swelling, neck pain and neck stiffness.  Neurological: Negative.   Psychiatric/Behavioral: Negative for agitation, behavioral problems, confusion, decreased concentration, dysphoric mood, hallucinations, self-injury, sleep disturbance and suicidal ideas. The patient is nervous/anxious. The patient is not hyperactive.     Per HPI unless specifically indicated above     Objective:    BP 115/80 (BP Location: Left Arm, Patient Position: Sitting, Cuff Size: Normal)   Pulse 80   Temp 98.3 F (36.8 C)   Wt 185 lb 2 oz (84 kg)   SpO2 100%   BMI 27.34 kg/m   Wt Readings from Last 3 Encounters:  07/14/17 185 lb (83.9 kg)  07/11/17 185 lb 2 oz (84 kg)  07/03/17 196 lb 6.4 oz (89.1 kg)    Physical Exam  Constitutional: She is oriented to person, place, and time. She appears well-developed and well-nourished. No distress.  HENT:  Head: Normocephalic and atraumatic.  Right Ear: Hearing normal.  Left Ear: Hearing normal.  Nose: Nose normal.  Eyes: Conjunctivae and lids are normal. Right eye exhibits no discharge. Left eye exhibits no discharge. No scleral icterus.  Cardiovascular: Normal rate, regular rhythm, normal heart sounds and intact distal pulses.  Exam reveals no gallop and no  friction rub.   No murmur heard. Pulmonary/Chest: Effort normal and breath sounds normal. No respiratory distress. She has no wheezes. She has no rales. She exhibits no tenderness.  Abdominal: Soft. Bowel sounds are normal. She exhibits no distension and no mass. There is no tenderness. There is no rebound and no guarding.  Musculoskeletal: Normal range of motion.  Neurological: She is alert and oriented to person, place, and time.  Skin: Skin is warm, dry and intact. No rash  noted. She is not diaphoretic. No erythema. No pallor.  Psychiatric: She has a normal mood and affect. Her speech is normal and behavior is normal. Judgment and thought content normal. Cognition and memory are normal.  Nursing note and vitals reviewed.   Results for orders placed or performed during the hospital encounter of 07/03/17  Basic metabolic panel  Result Value Ref Range   Sodium 139 135 - 145 mmol/L   Potassium 4.0 3.5 - 5.1 mmol/L   Chloride 106 101 - 111 mmol/L   CO2 27 22 - 32 mmol/L   Glucose, Bld 101 (H) 65 - 99 mg/dL   BUN 14 6 - 20 mg/dL   Creatinine, Ser 1.61 0.44 - 1.00 mg/dL   Calcium 9.0 8.9 - 09.6 mg/dL   GFR calc non Af Amer >60 >60 mL/min   GFR calc Af Amer >60 >60 mL/min   Anion gap 6 5 - 15  CBC  Result Value Ref Range   WBC 6.4 3.6 - 11.0 K/uL   RBC 3.36 (L) 3.80 - 5.20 MIL/uL   Hemoglobin 10.3 (L) 12.0 - 16.0 g/dL   HCT 04.5 (L) 40.9 - 81.1 %   MCV 89.9 80.0 - 100.0 fL   MCH 30.6 26.0 - 34.0 pg   MCHC 34.0 32.0 - 36.0 g/dL   RDW 91.4 78.2 - 95.6 %   Platelets 195 150 - 440 K/uL  Troponin I  Result Value Ref Range   Troponin I 1.32 (HH) <0.03 ng/mL  APTT  Result Value Ref Range   aPTT 115 (H) 24 - 36 seconds  Protime-INR  Result Value Ref Range   Prothrombin Time 13.5 11.4 - 15.2 seconds   INR 1.03   HIV antibody (Routine Testing)  Result Value Ref Range   HIV Screen 4th Generation wRfx Non Reactive Non Reactive  Troponin I  Result Value Ref Range   Troponin I 1.51 (HH) <0.03 ng/mL  Troponin I  Result Value Ref Range   Troponin I 1.44 (HH) <0.03 ng/mL  Troponin I  Result Value Ref Range   Troponin I 1.44 (HH) <0.03 ng/mL  Heparin level (unfractionated)  Result Value Ref Range   Heparin Unfractionated 0.99 (H) 0.30 - 0.70 IU/mL  Heparin level (unfractionated)  Result Value Ref Range   Heparin Unfractionated 1.19 (H) 0.30 - 0.70 IU/mL  CBC  Result Value Ref Range   WBC 7.0 3.6 - 11.0 K/uL   RBC 3.08 (L) 3.80 - 5.20 MIL/uL    Hemoglobin 9.7 (L) 12.0 - 16.0 g/dL   HCT 21.3 (L) 08.6 - 57.8 %   MCV 90.6 80.0 - 100.0 fL   MCH 31.4 26.0 - 34.0 pg   MCHC 34.6 32.0 - 36.0 g/dL   RDW 46.9 62.9 - 52.8 %   Platelets 177 150 - 440 K/uL  Heparin level (unfractionated)  Result Value Ref Range   Heparin Unfractionated 0.99 (H) 0.30 - 0.70 IU/mL      Assessment & Plan:   Problem List Items Addressed  This Visit      Cardiovascular and Mediastinum   NSTEMI (non-ST elevated myocardial infarction) (HCC)    Resolved. Congratulated patient on healthy eating. Follow up with cardiology as needed. Continue diet and exercise and current medications. Will recheck LFTs and cholesterol in about 3-4 weeks to make sure she's tolerating them well.         Other   Fibromyalgia    In acute exacerbation. Currently on cymbalta and lyrica. Follow up with psychiatry. Check back in in about 1 month, will adjust dose as needed at that time.       Depression    Not under good control. Seeing psychiatry on Tuesday. Will add some hydroxyzine in prior to her seeing them. Call with any concerns.       Relevant Medications   hydrOXYzine (ATARAX/VISTARIL) 25 MG tablet   Mixed hyperlipidemia    Continue diet and exercise and current medications. Will recheck LFTs and cholesterol in about 3-4 weeks to make sure she's tolerating them well.           Follow up plan: Return in about 4 weeks (around 08/08/2017) for Physical.
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
## 2017-07-14 ENCOUNTER — Encounter: Payer: Self-pay | Admitting: Cardiovascular Disease

## 2017-07-14 ENCOUNTER — Ambulatory Visit (INDEPENDENT_AMBULATORY_CARE_PROVIDER_SITE_OTHER): Payer: Medicare Other | Admitting: Cardiovascular Disease

## 2017-07-14 VITALS — BP 110/80 | HR 62 | Ht 69.0 in | Wt 185.0 lb

## 2017-07-14 DIAGNOSIS — E782 Mixed hyperlipidemia: Secondary | ICD-10-CM | POA: Diagnosis not present

## 2017-07-14 DIAGNOSIS — F419 Anxiety disorder, unspecified: Secondary | ICD-10-CM | POA: Diagnosis not present

## 2017-07-14 DIAGNOSIS — G894 Chronic pain syndrome: Secondary | ICD-10-CM | POA: Diagnosis not present

## 2017-07-14 DIAGNOSIS — F17200 Nicotine dependence, unspecified, uncomplicated: Secondary | ICD-10-CM | POA: Insufficient documentation

## 2017-07-14 DIAGNOSIS — I214 Non-ST elevation (NSTEMI) myocardial infarction: Secondary | ICD-10-CM | POA: Diagnosis not present

## 2017-07-14 DIAGNOSIS — I10 Essential (primary) hypertension: Secondary | ICD-10-CM

## 2017-07-14 NOTE — Patient Instructions (Signed)

## 2017-07-15 DIAGNOSIS — Z79899 Other long term (current) drug therapy: Secondary | ICD-10-CM | POA: Diagnosis not present

## 2017-07-15 NOTE — Assessment & Plan Note (Signed)
Not under good control. Seeing psychiatry on Tuesday. Will add some hydroxyzine in prior to her seeing them. Call with any concerns.

## 2017-07-15 NOTE — Assessment & Plan Note (Signed)
Continue diet and exercise and current medications. Will recheck LFTs and cholesterol in about 3-4 weeks to make sure she's tolerating them well.

## 2017-07-15 NOTE — Assessment & Plan Note (Signed)
In acute exacerbation. Currently on cymbalta and lyrica. Follow up with psychiatry. Check back in in about 1 month, will adjust dose as needed at that time.

## 2017-07-15 NOTE — Assessment & Plan Note (Signed)
Resolved. Congratulated patient on healthy eating. Follow up with cardiology as needed. Continue diet and exercise and current medications. Will recheck LFTs and cholesterol in about 3-4 weeks to make sure she's tolerating them well.

## 2017-07-20 ENCOUNTER — Encounter: Payer: Self-pay | Admitting: Family Medicine

## 2017-07-21 ENCOUNTER — Encounter: Payer: Medicare Other | Attending: Cardiovascular Disease | Admitting: *Deleted

## 2017-07-21 ENCOUNTER — Encounter: Payer: Self-pay | Admitting: *Deleted

## 2017-07-21 ENCOUNTER — Telehealth: Payer: Self-pay | Admitting: Family Medicine

## 2017-07-21 ENCOUNTER — Other Ambulatory Visit: Payer: Self-pay | Admitting: Family Medicine

## 2017-07-21 VITALS — Ht 69.7 in | Wt 187.4 lb

## 2017-07-21 DIAGNOSIS — I10 Essential (primary) hypertension: Secondary | ICD-10-CM | POA: Diagnosis not present

## 2017-07-21 DIAGNOSIS — M797 Fibromyalgia: Secondary | ICD-10-CM

## 2017-07-21 DIAGNOSIS — Z7982 Long term (current) use of aspirin: Secondary | ICD-10-CM | POA: Diagnosis not present

## 2017-07-21 DIAGNOSIS — M545 Low back pain: Secondary | ICD-10-CM | POA: Diagnosis not present

## 2017-07-21 DIAGNOSIS — F329 Major depressive disorder, single episode, unspecified: Secondary | ICD-10-CM | POA: Diagnosis not present

## 2017-07-21 DIAGNOSIS — F419 Anxiety disorder, unspecified: Secondary | ICD-10-CM | POA: Insufficient documentation

## 2017-07-21 DIAGNOSIS — Z955 Presence of coronary angioplasty implant and graft: Secondary | ICD-10-CM | POA: Diagnosis not present

## 2017-07-21 DIAGNOSIS — Z7902 Long term (current) use of antithrombotics/antiplatelets: Secondary | ICD-10-CM | POA: Insufficient documentation

## 2017-07-21 DIAGNOSIS — E559 Vitamin D deficiency, unspecified: Secondary | ICD-10-CM | POA: Insufficient documentation

## 2017-07-21 DIAGNOSIS — M47812 Spondylosis without myelopathy or radiculopathy, cervical region: Secondary | ICD-10-CM

## 2017-07-21 DIAGNOSIS — Z79899 Other long term (current) drug therapy: Secondary | ICD-10-CM | POA: Diagnosis not present

## 2017-07-21 DIAGNOSIS — I214 Non-ST elevation (NSTEMI) myocardial infarction: Secondary | ICD-10-CM

## 2017-07-21 DIAGNOSIS — I2510 Atherosclerotic heart disease of native coronary artery without angina pectoris: Secondary | ICD-10-CM | POA: Diagnosis not present

## 2017-07-21 DIAGNOSIS — G894 Chronic pain syndrome: Secondary | ICD-10-CM | POA: Diagnosis not present

## 2017-07-21 DIAGNOSIS — Z87891 Personal history of nicotine dependence: Secondary | ICD-10-CM | POA: Insufficient documentation

## 2017-07-21 DIAGNOSIS — M47816 Spondylosis without myelopathy or radiculopathy, lumbar region: Secondary | ICD-10-CM

## 2017-07-21 NOTE — Telephone Encounter (Signed)
No need to put in new one. This is an easy fix.

## 2017-07-21 NOTE — Patient Instructions (Signed)
Patient Instructions  Patient Details  Name: Susan Macias MRN: [id number] Date of Birth: 12-22-1970 Referring Provider:  Antonieta Iba, MD  Below are the personal goals you chose as well as exercise and nutrition goals. Our goal is to help you keep on track towards obtaining and maintaining your goals. We will be discussing your progress on these goals with you throughout the program.  Initial Exercise Prescription:     Initial Exercise Prescription - 07/21/17 1400      Date of Initial Exercise RX and Referring Provider   Date 07/21/17   Referring Provider Julien Nordmann MD     Treadmill   MPH 1.5   Grade 0.5   Minutes 15   METs 2.25     Elliptical   Level 1   Speed 3.6   Minutes 15     T5 Nustep   Level 3   SPM 80   Minutes 15   METs 2     Prescription Details   Frequency (times per week) 2   Duration Progress to 45 minutes of aerobic exercise without signs/symptoms of physical distress     Intensity   THRR 40-80% of Max Heartrate 102-151   Ratings of Perceived Exertion 11-13   Perceived Dyspnea 0-4     Progression   Progression Continue to progress workloads to maintain intensity without signs/symptoms of physical distress.     Resistance Training   Training Prescription Yes   Weight 3 lbs   Reps 10-15      Exercise Goals: Frequency: Be able to perform aerobic exercise three times per week working toward 3-5 days per week.  Intensity: Work with a perceived exertion of 11 (fairly light) - 15 (hard) as tolerated. Follow your new exercise prescription and watch for changes in prescription as you progress with the program. Changes will be reviewed with you when they are made.  Duration: You should be able to do 30 minutes of continuous aerobic exercise in addition to a 5 minute warm-up and a 5 minute cool-down routine.  Nutrition Goals: Your personal nutrition goals will be established when you do your nutrition analysis with the dietician.  The  following are nutrition guidelines to follow: Cholesterol < 200mg /day Sodium < 1500mg /day Fiber: Women under 50 yrs - 25 grams per day  Personal Goals:     Personal Goals and Risk Factors at Admission - 07/21/17 1433      Core Components/Risk Factors/Patient Goals on Admission    Weight Management Yes;Weight Loss   Intervention Weight Management: Develop a combined nutrition and exercise program designed to reach desired caloric intake, while maintaining appropriate intake of nutrient and fiber, sodium and fats, and appropriate energy expenditure required for the weight goal.;Weight Management: Provide education and appropriate resources to help participant work on and attain dietary goals.   Admit Weight 187 lb 6.4 oz (85 kg)   Goal Weight: Short Term 185 lb (83.9 kg)   Goal Weight: Long Term 170 lb (77.1 kg)   Expected Outcomes Short Term: Continue to assess and modify interventions until short term weight is achieved;Long Term: Adherence to nutrition and physical activity/exercise program aimed toward attainment of established weight goal;Weight Loss: Understanding of general recommendations for a balanced deficit meal plan, which promotes 1-2 lb weight loss per week and includes a negative energy balance of [phone] kcal/d;Understanding of distribution of calorie intake throughout the day with the consumption of 4-5 meals/snacks   Tobacco Cessation Yes   Intervention Assist the participant  in steps to quit. Provide individualized education and counseling about committing to Tobacco Cessation, relapse prevention, and pharmacological support that can be provided by physician.;Education officer, environmental, assist with locating and accessing local/national Quit Smoking programs, and support quit date choice.   Expected Outcomes Short Term: Will demonstrate readiness to quit, by selecting a quit date.;Long Term: Complete abstinence from all tobacco products for at least 12 months from quit  date.;Short Term: Will quit all tobacco product use, adhering to prevention of relapse plan.   Hypertension Yes   Intervention Provide education on lifestyle modifcations including regular physical activity/exercise, weight management, moderate sodium restriction and increased consumption of fresh fruit, vegetables, and low fat dairy, alcohol moderation, and smoking cessation.;Monitor prescription use compliance.   Expected Outcomes Short Term: Continued assessment and intervention until BP is < 140/75mm HG in hypertensive participants. < 130/45mm HG in hypertensive participants with diabetes, heart failure or chronic kidney disease.;Long Term: Maintenance of blood pressure at goal levels.   Lipids Yes   Intervention Provide education and support for participant on nutrition & aerobic/resistive exercise along with prescribed medications to achieve LDL 70mg , HDL >40mg .   Expected Outcomes Short Term: Participant states understanding of desired cholesterol values and is compliant with medications prescribed. Participant is following exercise prescription and nutrition guidelines.;Long Term: Cholesterol controlled with medications as prescribed, with individualized exercise RX and with personalized nutrition plan. Value goals: LDL < 70mg , HDL > 40 mg.   Stress Yes  Is seeing clininc for depression and stress.  Expecting to have appointments set with a counselor soon.    Intervention Offer individual and/or small group education and counseling on adjustment to heart disease, stress management and health-related lifestyle change. Teach and support self-help strategies.;Refer participants experiencing significant psychosocial distress to appropriate mental health specialists for further evaluation and treatment. When possible, include family members and significant others in education/counseling sessions.   Expected Outcomes Short Term: Participant demonstrates changes in health-related behavior, relaxation and  other stress management skills, ability to obtain effective social support, and compliance with psychotropic medications if prescribed.;Long Term: Emotional wellbeing is indicated by absence of clinically significant psychosocial distress or social isolation.      Tobacco Use Initial Evaluation: History  Smoking Status  . Former Smoker  . Packs/day: 1.00  . Years: 20.00  . Types: Cigarettes  Smokeless Tobacco  . Never Used    Comment: Quit July 17th 2018    Copy of goals given to participant.
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
## 2017-07-21 NOTE — Telephone Encounter (Signed)
Patient now saying that [facility] pain management will not see her. Would like to see [facility] pain management. Can we change the pain management referral in there to [facility], or would you like me to put another one in. Sorry about this.

## 2017-07-21 NOTE — Progress Notes (Signed)
Daily Session Note  Patient Details  Name: Susan Macias MRN: [id number] Date of Birth: Feb 17, 1971 Referring Provider:     Cardiac Rehab from 07/21/2017 in [facility] Cardiac and Pulmonary Rehab  Referring Provider  Ida Rogue MD      Encounter Date: 07/21/2017  Check In:     Session Check In - 07/21/17 1423      Check-In   Location [facility]-Cardiac & Pulmonary Rehab   Staff Present Heath Lark, RN, BSN, [place], [place], ACSM RCEP, Exercise Physiologist   Supervising physician immediately available to respond to emergencies See telemetry face sheet for immediately available ER MD   Medication changes reported     No   Fall or balance concerns reported    No   Warm-up and Cool-down Performed as group-led instruction   Resistance Training Performed Yes   VAD Patient? No     Pain Assessment   Currently in Pain? No/denies           Exercise Prescription Changes - 07/21/17 1400      Response to Exercise   Blood Pressure (Admit) 126/74   Blood Pressure (Exercise) 146/74   Blood Pressure (Exit) 126/64   Heart Rate (Admit) 54 bpm   Heart Rate (Exercise) 104 bpm   Heart Rate (Exit) 66 bpm   Oxygen Saturation (Admit) 97 %   Oxygen Saturation (Exercise) 98 %   Rating of Perceived Exertion (Exercise) 12      History  Smoking Status  . Former Smoker  . Packs/day: 1.00  . Years: 20.00  . Types: Cigarettes  Smokeless Tobacco  . Never Used    Comment: Quit July 17th 2018    Goals Met:  Exercise tolerated well Personal goals reviewed No report of cardiac concerns or symptoms Strength training completed today  Goals Unmet:  Not Applicable  Comments: Medical review completed today   Dr. Emily Filbert is Medical Director for [place] and [facility].

## 2017-07-21 NOTE — Telephone Encounter (Signed)
Thanks

## 2017-07-22 ENCOUNTER — Encounter: Payer: Self-pay | Admitting: Cardiovascular Disease

## 2017-07-23 ENCOUNTER — Encounter: Payer: Self-pay | Admitting: Intensive Care

## 2017-07-23 ENCOUNTER — Other Ambulatory Visit: Payer: Self-pay

## 2017-07-23 ENCOUNTER — Emergency Department
Admission: EM | Admit: 2017-07-23 | Discharge: 2017-07-23 | Disposition: A | Discharge status: Home / Self Care | Payer: Medicare Other | Attending: Emergency Medicine | Admitting: Emergency Medicine

## 2017-07-23 ENCOUNTER — Emergency Department: Payer: Medicare Other

## 2017-07-23 ENCOUNTER — Telehealth: Payer: Self-pay | Admitting: Cardiovascular Disease

## 2017-07-23 DIAGNOSIS — Z7982 Long term (current) use of aspirin: Secondary | ICD-10-CM | POA: Diagnosis not present

## 2017-07-23 DIAGNOSIS — R079 Chest pain, unspecified: Secondary | ICD-10-CM | POA: Diagnosis not present

## 2017-07-23 DIAGNOSIS — Z7902 Long term (current) use of antithrombotics/antiplatelets: Secondary | ICD-10-CM | POA: Insufficient documentation

## 2017-07-23 DIAGNOSIS — I2510 Atherosclerotic heart disease of native coronary artery without angina pectoris: Secondary | ICD-10-CM | POA: Diagnosis not present

## 2017-07-23 DIAGNOSIS — I10 Essential (primary) hypertension: Secondary | ICD-10-CM | POA: Diagnosis not present

## 2017-07-23 DIAGNOSIS — I214 Non-ST elevation (NSTEMI) myocardial infarction: Secondary | ICD-10-CM | POA: Insufficient documentation

## 2017-07-23 DIAGNOSIS — Z87891 Personal history of nicotine dependence: Secondary | ICD-10-CM | POA: Diagnosis not present

## 2017-07-23 DIAGNOSIS — R0789 Other chest pain: Secondary | ICD-10-CM | POA: Diagnosis not present

## 2017-07-23 DIAGNOSIS — Z79899 Other long term (current) drug therapy: Secondary | ICD-10-CM | POA: Diagnosis not present

## 2017-07-23 LAB — CBC
HCT: 35.4 % (ref 35.0–47.0)
Hemoglobin: 11.8 g/dL — ABNORMAL LOW (ref 12.0–16.0)
MCH: 30.4 pg (ref 26.0–34.0)
MCHC: 33.3 g/dL (ref 32.0–36.0)
MCV: 91.2 fL (ref 80.0–100.0)
PLATELETS: 304 10*3/uL (ref 150–440)
RBC: 3.89 MIL/uL (ref 3.80–5.20)
RDW: 14.9 % — AB (ref 11.5–14.5)
WBC: 4.8 10*3/uL (ref 3.6–11.0)

## 2017-07-23 LAB — BASIC METABOLIC PANEL
Anion gap: 7 (ref 5–15)
BUN: 18 mg/dL (ref 6–20)
CO2: 27 mmol/L (ref 22–32)
CREATININE: 0.76 mg/dL (ref 0.44–1.00)
Calcium: 9 mg/dL (ref 8.9–10.3)
Chloride: 106 mmol/L (ref 101–111)
Glucose, Bld: 115 mg/dL — ABNORMAL HIGH (ref 65–99)
POTASSIUM: 4 mmol/L (ref 3.5–5.1)
SODIUM: 140 mmol/L (ref 135–145)

## 2017-07-23 LAB — TROPONIN I: Troponin I: <0.03 ng/mL (ref <0.03)

## 2017-07-23 NOTE — ED Notes (Signed)
Pt states L sided CP that began yesterday. States few weeks ago had 1 stent placed. States has had CP since then. Pt asking about leaving, stating she wants to leave. Pt informed that she is waiting to see the doctor. Pt is alert, oriented, no distress noted.

## 2017-07-23 NOTE — ED Notes (Signed)
Pt requested that she be unhooked from cardiac monitor. No distress noted. Unhooked pt per request.
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
## 2017-07-23 NOTE — Discharge Instructions (Signed)

## 2017-07-23 NOTE — ED Triage Notes (Signed)
Patient presents to ER with c/o Left sided stabbing chest pain that started last night. HX current stent placed No distress noted in triage. Color WNL. A&O x4

## 2017-07-23 NOTE — Telephone Encounter (Signed)
Patient calling to let Dr. Mariah Milling know she has been in the [facility] ED since 11 this morning.  She has had labs and an xray still having chest pain chest tightness but they have placed her back in the waiting room.  Please call.

## 2017-07-23 NOTE — Telephone Encounter (Signed)
Will make Dr. Mariah Milling aware, but he is in clinic this afternoon.  If she is having chest pain, the ED is the safest place for her and there is not much we can do from here.

## 2017-07-23 NOTE — ED Notes (Signed)
Dr. Forbach at bedside.  

## 2017-07-24 ENCOUNTER — Other Ambulatory Visit: Payer: Self-pay

## 2017-07-24 ENCOUNTER — Other Ambulatory Visit: Payer: Self-pay | Admitting: Family Medicine

## 2017-07-24 MED ORDER — CLOPIDOGREL BISULFATE 75 MG PO TABS: 75.0000 mg | ORAL_TABLET | Freq: Every day | ORAL | 3 refills | Status: DC

## 2017-07-24 MED ORDER — METOPROLOL TARTRATE 25 MG PO TABS: 25.0000 mg | ORAL_TABLET | Freq: Two times a day (BID) | ORAL | 3 refills | Status: DC

## 2017-07-24 MED ORDER — ATORVASTATIN CALCIUM 80 MG PO TABS: 80.0000 mg | ORAL_TABLET | Freq: Every day | ORAL | 3 refills | Status: DC

## 2017-07-24 MED ORDER — LISINOPRIL 5 MG PO TABS: 5.0000 mg | ORAL_TABLET | Freq: Every day | ORAL | 3 refills | Status: DC

## 2017-07-24 NOTE — Telephone Encounter (Signed)
Patient needs to speak to someone who can help her figure out which medications Dr Laural Benes will provide for her. She says she spoke with her cardiologist and they have told her they would not prescribe the BP med and she is unsure of the other medications.   Please advise.  Thanks  Melysa  [phone]

## 2017-07-24 NOTE — Telephone Encounter (Signed)
Spoke with patient, she needs all the medications she was started on in the hospital. Pt also wanted to let you know that her psychiatrist increase her Cymbalta to 30 mg bid, and started her on a low dose of ativan (medication is on allergy list)

## 2017-07-25 ENCOUNTER — Telehealth: Payer: Self-pay | Admitting: Cardiovascular Disease

## 2017-07-25 NOTE — Telephone Encounter (Signed)
Patient states that she went to ED for chest pain and then she was sent home. She reports that she is still short of breath at times and this worries her greatly. Let her know that I would have Dr. Mariah Milling review and we would give her a call with any recommendations or possible follow up appointment. She verbalized understanding with no further questions at this time.

## 2017-07-25 NOTE — Telephone Encounter (Signed)
Pt was seen in ED on 07/23/17 But was just seen in office on 07/14/17  She was seen for CP  Please call and advise if patient needs an appointment

## 2017-07-28 ENCOUNTER — Other Ambulatory Visit: Payer: Self-pay

## 2017-07-28 ENCOUNTER — Telehealth: Payer: Self-pay | Admitting: Cardiovascular Disease

## 2017-07-28 NOTE — Telephone Encounter (Signed)
Opened in error

## 2017-07-28 NOTE — Telephone Encounter (Signed)
Received records request from [employer], forwarded to [facility] for processing.(xx)

## 2017-07-29 ENCOUNTER — Telehealth: Payer: Self-pay | Admitting: Family Medicine

## 2017-07-29 ENCOUNTER — Ambulatory Visit: Payer: Medicare Other

## 2017-07-29 ENCOUNTER — Telehealth: Payer: Self-pay | Admitting: *Deleted

## 2017-07-29 NOTE — Telephone Encounter (Signed)
Called pt to schedule for Annual Wellness Visit with Nurse Health Advisor, Tiffany Hill, my c/b # is [phone]  Kathryn Brown ° °

## 2017-07-29 NOTE — Telephone Encounter (Signed)
Susan Macias called to let us know that she was having a flare up and would not be here today.  She is planning to wait and start next Tuesday.

## 2017-07-30 ENCOUNTER — Other Ambulatory Visit: Payer: Self-pay | Admitting: Nurse Practitioner

## 2017-07-30 ENCOUNTER — Ambulatory Visit: Payer: Medicare Other | Attending: Nurse Practitioner | Admitting: Nurse Practitioner

## 2017-07-30 ENCOUNTER — Encounter: Payer: Self-pay | Admitting: Nurse Practitioner

## 2017-07-30 VITALS — BP 110/86 | HR 63 | Temp 98.2°F | Resp 16 | Ht 69.0 in | Wt 187.0 lb

## 2017-07-30 DIAGNOSIS — M47892 Other spondylosis, cervical region: Secondary | ICD-10-CM | POA: Insufficient documentation

## 2017-07-30 DIAGNOSIS — M545 Low back pain: Secondary | ICD-10-CM | POA: Insufficient documentation

## 2017-07-30 DIAGNOSIS — G894 Chronic pain syndrome: Secondary | ICD-10-CM | POA: Diagnosis not present

## 2017-07-30 DIAGNOSIS — I2510 Atherosclerotic heart disease of native coronary artery without angina pectoris: Secondary | ICD-10-CM | POA: Diagnosis not present

## 2017-07-30 DIAGNOSIS — M79671 Pain in right foot: Secondary | ICD-10-CM | POA: Diagnosis not present

## 2017-07-30 DIAGNOSIS — I252 Old myocardial infarction: Secondary | ICD-10-CM | POA: Diagnosis not present

## 2017-07-30 DIAGNOSIS — Z79899 Other long term (current) drug therapy: Secondary | ICD-10-CM | POA: Diagnosis not present

## 2017-07-30 DIAGNOSIS — M79641 Pain in right hand: Secondary | ICD-10-CM | POA: Insufficient documentation

## 2017-07-30 DIAGNOSIS — M533 Sacrococcygeal disorders, not elsewhere classified: Secondary | ICD-10-CM

## 2017-07-30 DIAGNOSIS — M79605 Pain in left leg: Secondary | ICD-10-CM | POA: Diagnosis not present

## 2017-07-30 DIAGNOSIS — E559 Vitamin D deficiency, unspecified: Secondary | ICD-10-CM | POA: Diagnosis not present

## 2017-07-30 DIAGNOSIS — M79604 Pain in right leg: Secondary | ICD-10-CM

## 2017-07-30 DIAGNOSIS — G5600 Carpal tunnel syndrome, unspecified upper limb: Secondary | ICD-10-CM | POA: Diagnosis not present

## 2017-07-30 DIAGNOSIS — M79601 Pain in right arm: Secondary | ICD-10-CM | POA: Diagnosis not present

## 2017-07-30 DIAGNOSIS — Z9889 Other specified postprocedural states: Secondary | ICD-10-CM | POA: Diagnosis not present

## 2017-07-30 DIAGNOSIS — Z7982 Long term (current) use of aspirin: Secondary | ICD-10-CM | POA: Insufficient documentation

## 2017-07-30 DIAGNOSIS — R52 Pain, unspecified: Secondary | ICD-10-CM

## 2017-07-30 DIAGNOSIS — M79672 Pain in left foot: Secondary | ICD-10-CM | POA: Diagnosis not present

## 2017-07-30 DIAGNOSIS — Z79891 Long term (current) use of opiate analgesic: Secondary | ICD-10-CM | POA: Insufficient documentation

## 2017-07-30 DIAGNOSIS — M47896 Other spondylosis, lumbar region: Secondary | ICD-10-CM | POA: Insufficient documentation

## 2017-07-30 DIAGNOSIS — G8929 Other chronic pain: Secondary | ICD-10-CM

## 2017-07-30 DIAGNOSIS — M542 Cervicalgia: Secondary | ICD-10-CM | POA: Insufficient documentation

## 2017-07-30 DIAGNOSIS — Z90710 Acquired absence of both cervix and uterus: Secondary | ICD-10-CM | POA: Insufficient documentation

## 2017-07-30 DIAGNOSIS — M79602 Pain in left arm: Secondary | ICD-10-CM

## 2017-07-30 DIAGNOSIS — F4310 Post-traumatic stress disorder, unspecified: Secondary | ICD-10-CM | POA: Insufficient documentation

## 2017-07-30 DIAGNOSIS — X58XXXA Exposure to other specified factors, initial encounter: Secondary | ICD-10-CM | POA: Insufficient documentation

## 2017-07-30 NOTE — Patient Instructions (Addendum)
____________________________________________________________________________________________  Appointment Policy Summary  It is our goal and responsibility to provide the medical community with assistance in the evaluation and management of patients with chronic pain. Unfortunately our resources are limited. Because we do not have an unlimited amount of time, or available appointments, we are required to closely monitor and manage their use. The following rules exist to maximize their use:  Patient's responsibilities: 1. Punctuality:  At what time should I arrive? You should be physically present in our office 30 minutes before your scheduled appointment. Your scheduled appointment is with your assigned healthcare provider. However, it takes 5-10 minutes to be "checked-in", and another 15 minutes for the nurses to do the admission. If you arrive to our office at the time you were given for your appointment, you will end up being at least 20-25 minutes late to your appointment with the provider. 2. Tardiness:  What happens if I arrive only a few minutes after my scheduled appointment time? You will need to reschedule your appointment. The cutoff is your appointment time. This is why it is so important that you arrive at least 30 minutes before that appointment. If you have an appointment scheduled for 10:00 AM and you arrive at 10:01, you will be required to reschedule your appointment.  3. Plan ahead:  Always assume that you will encounter traffic on your way in. Plan for it. If you are dependent on a driver, make sure they understand these rules and the need to arrive early. 4. Other appointments and responsibilities:  Avoid scheduling any other appointments before or after your pain clinic appointments.  5. Be prepared:  Write down everything that you need to discuss with your healthcare provider and give this information to the admitting nurse. Write down the medications that you will need  refilled. Bring your pills and bottles (even the empty ones), to all of your appointments, except for those where a procedure is scheduled. 6. No children or pets:  Find someone to take care of them. It is not appropriate to bring them in. 7. Scheduling changes:  We request "advanced notification" of any changes or cancellations. 8. Advanced notification:  Defined as a time period of more than 24 hours prior to the originally scheduled appointment. This allows for the appointment to be offered to other patients. 9. Rescheduling:  When a visit is rescheduled, it will require the cancellation of the original appointment. For this reason they both fall within the category of "Cancellations".  10. Cancellations:  They require advanced notification. Any cancellation less than 24 hours before the  appointment will be recorded as a "No Show". 11. No Show:  Defined as an unkept appointment where the patient failed to notify or declare to the practice their intention or inability to keep the appointment.  Corrective process for repeat offenders:  1. Tardiness: Three (3) episodes of rescheduling due to late arrivals will be recorded as one (1) "No Show". 2. Cancellation or reschedule: Three (3) cancellations or rescheduling will be recorded as one (1) "No Show". 3. "No Shows": Three (3) "No Shows" within a 12 month period will result in discharge from the practice.  ____________________________________________________________________________________________  ____________________________________________________________________________________________  Pain Scale  Introduction: The pain score used by this practice is the Verbal Numerical Rating Scale (VNRS-11). This is an 11-point scale. It is for adults and children 10 years or older. There are significant differences in how the pain score is reported, used, and applied. Forget everything you learned in the past and  learn this scoring  system.  General Information: The scale should reflect your current level of pain. Unless you are specifically asked for the level of your worst pain, or your average pain. If you are asked for one of these two, then it should be understood that it is over the past 24 hours.  Basic Activities of Daily Living (ADL): Personal hygiene, dressing, eating, transferring, and using restroom.  Instructions: Most patients tend to report their level of pain as a combination of two factors, their physical pain and their psychosocial pain. This last one is also known as "suffering" and it is reflection of how physical pain affects you socially and psychologically. From now on, report them separately. From this point on, when asked to report your pain level, report only your physical pain. Use the following table for reference.  Pain Clinic Pain Levels (0-5/10)  Pain Level Score  Description  No Pain 0   Mild pain 1 Nagging, annoying, but does not interfere with basic activities of daily living (ADL). Patients are able to eat, bathe, get dressed, toileting (being able to get on and off the toilet and perform personal hygiene functions), transfer (move in and out of bed or a chair without assistance), and maintain continence (able to control bladder and bowel functions). Blood pressure and heart rate are unaffected. A normal heart rate for a healthy adult ranges from 60 to 100 bpm (beats per minute).   Mild to moderate pain 2 Noticeable and distracting. Impossible to hide from other people. More frequent flare-ups. Still possible to adapt and function close to normal. It can be very annoying and may have occasional stronger flare-ups. With discipline, patients may get used to it and adapt.   Moderate pain 3 Interferes significantly with activities of daily living (ADL). It becomes difficult to feed, bathe, get dressed, get on and off the toilet or to perform personal hygiene functions. Difficult to get in and out of  bed or a chair without assistance. Very distracting. With effort, it can be ignored when deeply involved in activities.   Moderately severe pain 4 Impossible to ignore for more than a few minutes. With effort, patients may still be able to manage work or participate in some social activities. Very difficult to concentrate. Signs of autonomic nervous system discharge are evident: dilated pupils (mydriasis); mild sweating (diaphoresis); sleep interference. Heart rate becomes elevated (>115 bpm). Diastolic blood pressure (lower number) rises above 100 mmHg. Patients find relief in laying down and not moving.   Severe pain 5 Intense and extremely unpleasant. Associated with frowning face and frequent crying. Pain overwhelms the senses.  Ability to do any activity or maintain social relationships becomes significantly limited. Conversation becomes difficult. Pacing back and forth is common, as getting into a comfortable position is nearly impossible. Pain wakes you up from deep sleep. Physical signs will be obvious: pupillary dilation; increased sweating; goosebumps; brisk reflexes; cold, clammy hands and feet; nausea, vomiting or dry heaves; loss of appetite; significant sleep disturbance with inability to fall asleep or to remain asleep. When persistent, significant weight loss is observed due to the complete loss of appetite and sleep deprivation.  Blood pressure and heart rate becomes significantly elevated. Caution: If elevated blood pressure triggers a pounding headache associated with blurred vision, then the patient should immediately seek attention at an urgent or emergency care unit, as these may be signs of an impending stroke.    Emergency Department Pain Levels (6-10/10)  Emergency Room Pain 6   Severely limiting. Requires emergency care and should not be seen or managed at an outpatient pain management facility. Communication becomes difficult and requires great effort. Assistance to reach the  emergency department may be required. Facial flushing and profuse sweating along with potentially dangerous increases in heart rate and blood pressure will be evident.   Distressing pain 7 Self-care is very difficult. Assistance is required to transport, or use restroom. Assistance to reach the emergency department will be required. Tasks requiring coordination, such as bathing and getting dressed become very difficult.   Disabling pain 8 Self-care is no longer possible. At this level, pain is disabling. The individual is unable to do even the most "basic" activities such as walking, eating, bathing, dressing, transferring to a bed, or toileting. Fine motor skills are lost. It is difficult to think clearly.   Incapacitating pain 9 Pain becomes incapacitating. Thought processing is no longer possible. Difficult to remember your own name. Control of movement and coordination are lost.   The worst pain imaginable 10 At this level, most patients pass out from pain. When this level is reached, collapse of the autonomic nervous system occurs, leading to a sudden drop in blood pressure and heart rate. This in turn results in a temporary and dramatic drop in blood flow to the brain, leading to a loss of consciousness. Fainting is one of the body's self defense mechanisms. Passing out puts the brain in a calmed state and causes it to shut down for a while, in order to begin the healing process.    Summary: 1. Refer to this scale when providing [place] with your pain level. 2. Be accurate and careful when reporting your pain level. This will help with your care. 3. Over-reporting your pain level will lead to loss of credibility. 4. Even a level of 1/10 means that there is pain and will be treated at our facility. 5. High, inaccurate reporting will be documented as "Symptom Exaggeration", leading to loss of credibility and suspicions of possible secondary gains such as obtaining more narcotics, or wanting to appear  disabled, for fraudulent reasons. 6. Only pain levels of 5 or below will be seen at our facility. 7. Pain levels of 6 and above will be sent to the Emergency Department and the appointment cancelled. ____________________________________________________________________________________________   BMI Assessment: Estimated body mass index is 27.62 kg/m as calculated from the following:   Height as of this encounter: 5\' 9"  (1.753 m).   Weight as of this encounter: 187 lb (84.8 kg).  BMI interpretation table: BMI level Category Range association with higher incidence of chronic pain  <18 kg/m2 Underweight   18.5-24.9 kg/m2 Ideal body weight   25-29.9 kg/m2 Overweight Increased incidence by 20%  30-34.9 kg/m2 Obese (Class I) Increased incidence by 68%  35-39.9 kg/m2 Severe obesity (Class II) Increased incidence by 136%  >40 kg/m2 Extreme obesity (Class III) Increased incidence by 254%   BMI Readings from Last 4 Encounters:  07/30/17 27.62 kg/m  07/23/17 27.07 kg/m  07/21/17 27.12 kg/m  07/14/17 27.32 kg/m   Wt Readings from Last 4 Encounters:  07/30/17 187 lb (84.8 kg)  07/23/17 187 lb (84.8 kg)  07/21/17 187 lb 6.4 oz (85 kg)  07/14/17 185 lb (83.9 kg)

## 2017-07-30 NOTE — Progress Notes (Signed)
Safety precautions to be maintained throughout the outpatient stay will include: orient to surroundings, keep bed in low position, maintain call bell within reach at all times, provide assistance with transfer out of bed and ambulation.  

## 2017-07-30 NOTE — Progress Notes (Signed)
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 redness, or swelling Alignment: Symmetrical Functional ROM: Unrestricted ROM Stability: No instability detected Sensory: Unimpaired Muscle strength & Tone: No palpable anomalies  Lumbar Spine Exam  Inspection: No masses, redness, or swelling Alignment: Symmetrical Functional ROM: Unrestricted ROM      Stability: No instability detected Muscle strength & Tone: Functionally intact Sensory: Unimpaired Palpation: No palpable anomalies       Provocative Tests: Lumbar Hyperextension and rotation test: evaluation deferred today       Patrick's Maneuver: evaluation deferred today                    Gait & Posture Assessment  Ambulation: Unassisted Gait: Relatively normal for age and body habitus Posture: WNL   Lower Extremity Exam    Side: Right  lower extremity  Side: Left lower extremity  Inspection: No masses, redness, swelling, or asymmetry. No contractures  Inspection: No masses, redness, swelling, or asymmetry. No contractures   Functional ROM: Unrestricted ROM          Functional ROM: Unrestricted ROM          Muscle strength & Tone: Functionally intact  Muscle strength & Tone: Functionally intact  Sensory: Unimpaired  Sensory: Unimpaired  Palpation: No palpable anomalies  Palpation: No palpable anomalies   Assessment  Primary Diagnosis & Pertinent Problem List: The primary encounter diagnosis was Chronic generalized pain (Primary Area of Pain). Diagnoses of Chronic neck pain, Chronic bilateral low back pain, with sciatica presence unspecified, Chronic pain of both lower extremities, Chronic sacroiliac joint pain, Chronic pain of both upper extremities, Cervicalgia, Chronic pain syndrome, and Long term current use of opiate analgesic were also pertinent to this visit.  Visit Diagnosis: 1. Chronic generalized pain (Primary Area of Pain)   2. Chronic neck pain   3. Chronic bilateral low back pain, with sciatica presence unspecified   4. Chronic pain of both lower extremities   5. Chronic sacroiliac joint pain   6. Chronic pain of both upper extremities   7. Cervicalgia   8. Chronic pain syndrome   9. Long term current use of opiate analgesic    Plan of Care  Initial treatment plan:  Please be advised that as per protocol, today's visit has been an evaluation only. We have not taken over the patient's controlled substance management.  Problem-specific plan: No problem-specific Assessment & Plan notes found for this encounter.  Ordered Lab-work, Procedure(s), Referral(s), & Consult(s): Orders Placed This Encounter  Procedures  . DG Si Joints  . DG Lumb Spine Flex&Ext Only  . DG HIP UNILAT W OR W/O PELVIS 2-3 VIEWS LEFT  . DG HIP UNILAT W OR W/O PELVIS 2-3 VIEWS RIGHT  . MR CERVICAL SPINE WO CONTRAST  . Compliance Drug Analysis, Ur  . Comprehensive metabolic panel  . C-reactive protein  . Sedimentation rate  . Magnesium  . 25-Hydroxyvitamin D Lcms D2+D3  . Vitamin B12   Pharmacotherapy: Medications  ordered:  No orders of the defined types were placed in this encounter.  Medications administered during this visit: Ms. Collar had no medications administered during this visit.   Pharmacotherapy under consideration:  Opioid Analgesics: The patient was informed that there is no guarantee that she would be a candidate for opioid analgesics. The decision will be made following CDC guidelines. This decision will be based on the results of diagnostic studies, as well as Ms. Gohlke's risk profile.  Membrane stabilizer: To be determined at a later time Muscle relaxant: To be determined at a later time NSAID: To be determined at a later time Other analgesic(s): To be determined at a later time   Interventional therapies under consideration: Ms. Slee was informed that there is no guarantee that she would be a candidate for interventional therapies. The decision will be based on the results of diagnostic studies, as well as Ms. Kissling's risk profile.  Possible procedure(s): Possible diagnostic cervical epidural steroid injection Possible diagnostic cervical facet injection Cervical radiofrequency ablation Possible diagnostic lumbar epidural steroid injection Possible diagnostic lumbar facet injection Sacroiliac joint injection Lumbar radiofrequency ablation Possible diagnostic hip injection   Provider-requested follow-up: Return for 2nd Visit, w/ Dr. Dossie Arbour.  Future Appointments Date Time Provider [place]  07/31/2017 4:00 PM Valerie Roys, DO [facility] None  08/05/2017 8:00 AM [facility] PHASE  II EXC [facility] None  08/07/2017 8:00 AM [facility] PHASE II EXC [facility] None  08/12/2017 8:00 AM [facility] PHASE II EXC [facility] None  08/13/2017 7:45 AM Milinda Pointer, MD [facility] None  08/14/2017 8:00 AM [facility] PHASE II EXC [facility] None  08/19/2017 8:00 AM [facility] PHASE II EXC [facility] None  08/21/2017 8:00 AM [facility] PHASE II EXC [facility] None  08/26/2017 8:00  AM [facility] PHASE II EXC [facility] None  08/28/2017 8:00 AM [facility] PHASE II EXC [facility] None  09/02/2017 8:00 AM [facility] PHASE II EXC [facility] None  09/04/2017 8:00 AM [facility] PHASE II EXC [facility] None  09/09/2017 8:00 AM [facility] PHASE II EXC [facility] None  09/11/2017 8:00 AM [facility] PHASE II EXC [facility] None  09/16/2017 8:00 AM [facility] PHASE II EXC [facility] None  09/18/2017 8:00 AM [facility] PHASE II EXC [facility] None  09/23/2017 8:00 AM [facility] PHASE II EXC [facility] None  09/25/2017 8:00 AM [facility] PHASE II EXC [facility] None  09/30/2017 8:00 AM [facility] PHASE II EXC [facility] None  10/02/2017 8:00 AM [facility] PHASE II EXC [facility] None  10/07/2017 8:00 AM [facility] PHASE II EXC [facility] None  10/09/2017 8:00 AM [facility] PHASE II EXC [facility] None  10/14/2017 8:00 AM [facility] PHASE II EXC [facility] None  10/16/2017 8:00 AM [facility] PHASE II EXC [facility] None  10/21/2017 8:00 AM [facility] PHASE II EXC [facility] None    Primary Care Physician: Valerie Roys, DO Location: [facility] Note by:  Date: 07/30/2017; Time: 11:58 AM  Pain Score Disclaimer: We use the NRS-11 scale. This is a self-reported, subjective measurement of pain severity with only modest accuracy. It is used primarily to identify changes within a particular patient. It must be understood that outpatient pain scales are significantly less accurate that those used for research, where they can be applied under ideal controlled circumstances with minimal exposure to variables. In reality, the score is likely to be a combination of pain intensity and pain affect, where pain affect describes the degree of emotional arousal or changes in action readiness caused by the sensory experience of pain. Factors such as social and work situation, setting, emotional state, anxiety levels, expectation, and prior pain experience may influence pain  perception and show large inter-individual differences that may also be affected by time variables.  Patient instructions provided during this appointment: Patient Instructions    ____________________________________________________________________________________________  Appointment Policy Summary  It is our goal and responsibility to provide the medical community with assistance in the evaluation and management of patients with chronic pain. Unfortunately our resources are limited. Because we do not have an unlimited amount of time, or available appointments, we are required to closely monitor and manage their use. The following rules exist to maximize their use:  Patient's responsibilities: 1. Punctuality:  At what time should I arrive? You should be physically present in our office 30 minutes before your scheduled appointment. Your scheduled appointment is with your assigned healthcare provider. However, it takes 5-10 minutes to be "checked-in", and another 15 minutes for the nurses to do the admission. If you arrive to our office at the time you were given for your appointment, you will end up being at least 20-25 minutes late to your appointment with the provider. 2. Tardiness:  What happens if I arrive only a few minutes after my scheduled appointment time? You will need to reschedule your appointment. The cutoff is your appointment time. This is why it is so important that you arrive at least 30 minutes  before that appointment. If you have an appointment scheduled for 10:00 AM and you arrive at 10:01, you will be required to reschedule your appointment.  3. Plan ahead:  Always assume that you will encounter traffic on your way in. Plan for it. If you are dependent on a driver, make sure they understand these rules and the need to arrive early. 4. Other appointments and responsibilities:  Avoid scheduling any other appointments before or after your pain clinic appointments.  5. Be  prepared:  Write down everything that you need to discuss with your healthcare provider and give this information to the admitting nurse. Write down the medications that you will need refilled. Bring your pills and bottles (even the empty ones), to all of your appointments, except for those where a procedure is scheduled. 6. No children or pets:  Find someone to take care of them. It is not appropriate to bring them in. 7. Scheduling changes:  We request "advanced notification" of any changes or cancellations. 8. Advanced notification:  Defined as a time period of more than 24 hours prior to the originally scheduled appointment. This allows for the appointment to be offered to other patients. 9. Rescheduling:  When a visit is rescheduled, it will require the cancellation of the original appointment. For this reason they both fall within the category of "Cancellations".  10. Cancellations:  They require advanced notification. Any cancellation less than 24 hours before the  appointment will be recorded as a "No Show". 11. No Show:  Defined as an unkept appointment where the patient failed to notify or declare to the practice their intention or inability to keep the appointment.  Corrective process for repeat offenders:  1. Tardiness: Three (3) episodes of rescheduling due to late arrivals will be recorded as one (1) "No Show". 2. Cancellation or reschedule: Three (3) cancellations or rescheduling will be recorded as one (1) "No Show". 3. "No Shows": Three (3) "No Shows" within a 12 month period will result in discharge from the practice.  ____________________________________________________________________________________________  ____________________________________________________________________________________________  Pain Scale  Introduction: The pain score used by this practice is the Verbal Numerical Rating Scale (VNRS-11). This is an 11-point scale. It is for adults and children 10  years or older. There are significant differences in how the pain score is reported, used, and applied. Forget everything you learned in the past and learn this scoring system.  General Information: The scale should reflect your current level of pain. Unless you are specifically asked for the level of your worst pain, or your average pain. If you are asked for one of these two, then it should be understood that it is over the past 24 hours.  Basic Activities of Daily Living (ADL): Personal hygiene, dressing, eating, transferring, and using restroom.  Instructions: Most patients tend to report their level of pain as a combination of two factors, their physical pain and their psychosocial pain. This last one is also known as "suffering" and it is reflection of how physical pain affects you socially and psychologically. From now on, report them separately. From this point on, when asked to report your pain level, report only your physical pain. Use the following table for reference.  Pain Clinic Pain Levels (0-5/10)  Pain Level Score  Description  No Pain 0   Mild pain 1 Nagging, annoying, but does not interfere with basic activities of daily living (ADL). Patients are able to eat, bathe, get dressed, toileting (being able to get on and off the  toilet and perform personal hygiene functions), transfer (move in and out of bed or a chair without assistance), and maintain continence (able to control bladder and bowel functions). Blood pressure and heart rate are unaffected. A normal heart rate for a healthy adult ranges from 60 to 100 bpm (beats per minute).   Mild to moderate pain 2 Noticeable and distracting. Impossible to hide from other people. More frequent flare-ups. Still possible to adapt and function close to normal. It can be very annoying and may have occasional stronger flare-ups. With discipline, patients may get used to it and adapt.   Moderate pain 3 Interferes significantly with activities of  daily living (ADL). It becomes difficult to feed, bathe, get dressed, get on and off the toilet or to perform personal hygiene functions. Difficult to get in and out of bed or a chair without assistance. Very distracting. With effort, it can be ignored when deeply involved in activities.   Moderately severe pain 4 Impossible to ignore for more than a few minutes. With effort, patients may still be able to manage work or participate in some social activities. Very difficult to concentrate. Signs of autonomic nervous system discharge are evident: dilated pupils (mydriasis); mild sweating (diaphoresis); sleep interference. Heart rate becomes elevated (>115 bpm). Diastolic blood pressure (lower number) rises above 100 mmHg. Patients find relief in laying down and not moving.   Severe pain 5 Intense and extremely unpleasant. Associated with frowning face and frequent crying. Pain overwhelms the senses.  Ability to do any activity or maintain social relationships becomes significantly limited. Conversation becomes difficult. Pacing back and forth is common, as getting into a comfortable position is nearly impossible. Pain wakes you up from deep sleep. Physical signs will be obvious: pupillary dilation; increased sweating; goosebumps; brisk reflexes; cold, clammy hands and feet; nausea, vomiting or dry heaves; loss of appetite; significant sleep disturbance with inability to fall asleep or to remain asleep. When persistent, significant weight loss is observed due to the complete loss of appetite and sleep deprivation.  Blood pressure and heart rate becomes significantly elevated. Caution: If elevated blood pressure triggers a pounding headache associated with blurred vision, then the patient should immediately seek attention at an urgent or emergency care unit, as these may be signs of an impending stroke.    Emergency Department Pain Levels (6-10/10)  Emergency Room Pain 6 Severely limiting. Requires emergency  care and should not be seen or managed at an outpatient pain management facility. Communication becomes difficult and requires great effort. Assistance to reach the emergency department may be required. Facial flushing and profuse sweating along with potentially dangerous increases in heart rate and blood pressure will be evident.   Distressing pain 7 Self-care is very difficult. Assistance is required to transport, or use restroom. Assistance to reach the emergency department will be required. Tasks requiring coordination, such as bathing and getting dressed become very difficult.   Disabling pain 8 Self-care is no longer possible. At this level, pain is disabling. The individual is unable to do even the most "basic" activities such as walking, eating, bathing, dressing, transferring to a bed, or toileting. Fine motor skills are lost. It is difficult to think clearly.   Incapacitating pain 9 Pain becomes incapacitating. Thought processing is no longer possible. Difficult to remember your own name. Control of movement and coordination are lost.   The worst pain imaginable 10 At this level, most patients pass out from pain. When this level is reached, collapse of the autonomic  nervous system occurs, leading to a sudden drop in blood pressure and heart rate. This in turn results in a temporary and dramatic drop in blood flow to the brain, leading to a loss of consciousness. Fainting is one of the body's self defense mechanisms. Passing out puts the brain in a calmed state and causes it to shut down for a while, in order to begin the healing process.    Summary: 1. Refer to this scale when providing [place] with your pain level. 2. Be accurate and careful when reporting your pain level. This will help with your care. 3. Over-reporting your pain level will lead to loss of credibility. 4. Even a level of 1/10 means that there is pain and will be treated at our facility. 5. High, inaccurate reporting will be  documented as "Symptom Exaggeration", leading to loss of credibility and suspicions of possible secondary gains such as obtaining more narcotics, or wanting to appear disabled, for fraudulent reasons. 6. Only pain levels of 5 or below will be seen at our facility. 7. Pain levels of 6 and above will be sent to the Emergency Department and the appointment cancelled. ____________________________________________________________________________________________   BMI Assessment: Estimated body mass index is 27.62 kg/m as calculated from the following:   Height as of this encounter: 5' 9" (1.753 m).   Weight as of this encounter: 187 lb (84.8 kg).  BMI interpretation table: BMI level Category Range association with higher incidence of chronic pain  <18 kg/m2 Underweight   18.5-24.9 kg/m2 Ideal body weight   25-29.9 kg/m2 Overweight Increased incidence by 20%  30-34.9 kg/m2 Obese (Class I) Increased incidence by 68%  35-39.9 kg/m2 Severe obesity (Class II) Increased incidence by 136%  >40 kg/m2 Extreme obesity (Class III) Increased incidence by 254%   BMI Readings from Last 4 Encounters:  07/30/17 27.62 kg/m  07/23/17 27.07 kg/m  07/21/17 27.12 kg/m  07/14/17 27.32 kg/m   Wt Readings from Last 4 Encounters:  07/30/17 187 lb (84.8 kg)  07/23/17 187 lb (84.8 kg)  07/21/17 187 lb 6.4 oz (85 kg)  07/14/17 185 lb (83.9 kg)

## 2017-07-31 ENCOUNTER — Ambulatory Visit: Payer: Medicare Other

## 2017-07-31 ENCOUNTER — Encounter: Payer: Self-pay | Admitting: Family Medicine

## 2017-07-31 ENCOUNTER — Ambulatory Visit (INDEPENDENT_AMBULATORY_CARE_PROVIDER_SITE_OTHER): Payer: Medicare Other | Admitting: Family Medicine

## 2017-07-31 VITALS — BP 125/86 | HR 61 | Temp 97.6°F | Wt 181.4 lb

## 2017-07-31 DIAGNOSIS — L509 Urticaria, unspecified: Secondary | ICD-10-CM | POA: Diagnosis not present

## 2017-07-31 DIAGNOSIS — F419 Anxiety disorder, unspecified: Secondary | ICD-10-CM

## 2017-07-31 DIAGNOSIS — R8290 Unspecified abnormal findings in urine: Secondary | ICD-10-CM

## 2017-07-31 DIAGNOSIS — M797 Fibromyalgia: Secondary | ICD-10-CM

## 2017-07-31 DIAGNOSIS — M549 Dorsalgia, unspecified: Secondary | ICD-10-CM | POA: Diagnosis not present

## 2017-07-31 DIAGNOSIS — R52 Pain, unspecified: Secondary | ICD-10-CM

## 2017-07-31 DIAGNOSIS — G8929 Other chronic pain: Secondary | ICD-10-CM | POA: Insufficient documentation

## 2017-07-31 DIAGNOSIS — I25119 Atherosclerotic heart disease of native coronary artery with unspecified angina pectoris: Secondary | ICD-10-CM | POA: Diagnosis not present

## 2017-07-31 MED ORDER — PREGABALIN 200 MG PO CAPS: 200.0000 mg | ORAL_CAPSULE | Freq: Two times a day (BID) | ORAL | 1 refills | Status: DC

## 2017-07-31 MED ORDER — DIPHENHYDRAMINE HCL 25 MG PO CAPS: 25.0000 mg | ORAL_CAPSULE | Freq: Four times a day (QID) | ORAL | 1 refills | Status: DC | PRN

## 2017-07-31 NOTE — Progress Notes (Signed)
BP 125/86 (BP Location: Left Arm, Patient Position: Sitting, Cuff Size: Normal)   Pulse 61   Temp 97.6 F (36.4 C)   Wt 181 lb 7 oz (82.3 kg)   SpO2 99%   BMI 26.79 kg/m    Subjective:    Patient ID: Susan Macias, female    DOB: 1971-03-19, 46 y.o.   MRN: [id number]  HPI: Susan Macias is a 46 y.o. female  Chief Complaint  Patient presents with  . Medication Problem    Patient was started on Ativan and now she is breaking out and having headaches. Patients Cymbalta was increased and she feels like it isnt working, she would like to stop takjing it.   . Other    Patient was seen by Pain clinic, scheduled to see the doctor on 08/13/17  . Osteoporosis    Patient is concerned that she may have some bone loss  . foul urine    Patient states that she drinks a lot of water, but still has foul smelling urine   URINARY SYMPTOMS Duration: Couple of weeks Dysuria: no Urinary frequency: yes Urgency: yes Small volume voids: no Symptom severity: moderate Urinary incontinence: no Foul odor: yes Hematuria: no Abdominal pain: no Back pain: yes Suprapubic pain/pressure: no Flank pain: no Fever:  no Vomiting: no Previous urinary tract infection: yes Recurrent urinary tract infection: no  Has been following with the pain clinic. Is happy with them. They are going to do a bone scan and have her see the counselor. She is scheduled to go back to see them on 9/12.   She notes that she is still having some chest pain, has taken nitro a couple of times for it. She is unsure if it's her heart or if its more stress. She notes that her psychiatrist recently increased her cymbalta and she feels like she is having increased panic attacks since he did that. He gave her ativan, which she got a rash from before and since restarting it, she broke out in welps. She it very itchy and doesn't know what to do. She is otherwise doing well with no other concerns or complaints at this time.   Relevant  past medical, surgical, family and social history reviewed and updated as indicated. Interim medical history since our last visit reviewed. Allergies and medications reviewed and updated.  Review of Systems  Constitutional: Negative.   Respiratory: Negative.   Cardiovascular: Positive for chest pain. Negative for palpitations and leg swelling.  Musculoskeletal: Positive for arthralgias, back pain and myalgias. Negative for gait problem, joint swelling, neck pain and neck stiffness.  Neurological: Negative.   Psychiatric/Behavioral: Positive for dysphoric mood. Negative for agitation, behavioral problems, confusion, decreased concentration, hallucinations, self-injury, sleep disturbance and suicidal ideas. The patient is nervous/anxious. The patient is not hyperactive.     Per HPI unless specifically indicated above     Objective:    BP 125/86 (BP Location: Left Arm, Patient Position: Sitting, Cuff Size: Normal)   Pulse 61   Temp 97.6 F (36.4 C)   Wt 181 lb 7 oz (82.3 kg)   SpO2 99%   BMI 26.79 kg/m   Wt Readings from Last 3 Encounters:  07/31/17 181 lb 7 oz (82.3 kg)  07/30/17 187 lb (84.8 kg)  07/23/17 187 lb (84.8 kg)    Physical Exam  Constitutional: She is oriented to person, place, and time. She appears well-developed and well-nourished. No distress.  HENT:  Head: Normocephalic and atraumatic.  Right  Ear: Hearing normal.  Left Ear: Hearing normal.  Nose: Nose normal.  Eyes: Conjunctivae and lids are normal. Right eye exhibits no discharge. Left eye exhibits no discharge. No scleral icterus.  Cardiovascular: Normal rate, regular rhythm, normal heart sounds and intact distal pulses.  Exam reveals no gallop and no friction rub.   No murmur heard. Pulmonary/Chest: Effort normal and breath sounds normal. No respiratory distress. She has no wheezes. She has no rales. She exhibits no tenderness.  Abdominal: Soft. Bowel sounds are normal. She exhibits no distension and no mass.  There is no tenderness. There is no rebound and no guarding.  Musculoskeletal: Normal range of motion.  Neurological: She is alert and oriented to person, place, and time.  Skin: Skin is warm, dry and intact. No rash noted. No erythema. No pallor.  Psychiatric: She has a normal mood and affect. Her speech is normal and behavior is normal. Judgment and thought content normal. Cognition and memory are normal.  Nursing note and vitals reviewed.   Results for orders placed or performed in visit on 07/31/17  Microscopic Examination  Result Value Ref Range   WBC, UA 0-5 0 - 5 /hpf   RBC, UA 0-2 0 - 2 /hpf   Epithelial Cells (non renal) 0-10 0 - 10 /hpf   Mucus, UA Present (A) Not Estab.   Bacteria, UA None seen None seen/Few  UA/M w/rflx Culture, Routine  Result Value Ref Range   Specific Gravity, UA 1.025 1.005 - 1.030   pH, UA 6.0 5.0 - 7.5   Color, UA Yellow Yellow   Appearance Ur Cloudy (A) Clear   Leukocytes, UA Negative Negative   Protein, UA Negative Negative/Trace   Glucose, UA Negative Negative   Ketones, UA Trace (A) Negative   RBC, UA Trace (A) Negative   Bilirubin, UA Negative Negative   Urobilinogen, Ur 1.0 0.2 - 1.0 mg/dL   Nitrite, UA Negative Negative   Microscopic Examination See below:       Assessment & Plan:   Problem List Items Addressed This Visit      Cardiovascular and Mediastinum   CAD (coronary artery disease)    Advised her to call her cardiologist to follow up on the chest pain. Continue to monitor. Call with any concerns.         Other   Fibromyalgia    Continue to follow with pain management. Call with any concerns. We will increase her lyrica to 200mg  BID today. Recheck 1 month.       Anxiety    Not doing well with current regimen. Advised her to follow up with her psychiatrist and call with any concerns.        Other Visit Diagnoses    Foul smelling urine    -  Primary   UA negative today- likely due to her not drinking enough water.  Continue to monitor. Call with any concerns.    Relevant Orders   UA/M w/rflx Culture, Routine (Completed)   Hives       Stop ativan. Call psychiatry for alternative. Start benadryl for comfort. Call with any concerns.        Follow up plan: Return in about 4 weeks (around 08/28/2017).

## 2017-08-01 DIAGNOSIS — I2510 Atherosclerotic heart disease of native coronary artery without angina pectoris: Secondary | ICD-10-CM | POA: Insufficient documentation

## 2017-08-01 LAB — UA/M W/RFLX CULTURE, ROUTINE
Bilirubin, UA: NEGATIVE
GLUCOSE, UA: NEGATIVE
Leukocytes, UA: NEGATIVE
Nitrite, UA: NEGATIVE
PROTEIN UA: NEGATIVE
Specific Gravity, UA: 1.025 (ref 1.005–1.030)
UUROB: 1 mg/dL (ref 0.2–1.0)
pH, UA: 6 (ref 5.0–7.5)

## 2017-08-01 LAB — MICROSCOPIC EXAMINATION: Bacteria, UA: NONE SEEN

## 2017-08-01 LAB — SPECIMEN STATUS REPORT

## 2017-08-01 NOTE — Assessment & Plan Note (Signed)
Not doing well with current regimen. Advised her to follow up with her psychiatrist and call with any concerns.  

## 2017-08-01 NOTE — Assessment & Plan Note (Signed)
Not doing well with current regimen. Advised her to follow up with her psychiatrist and call with any concerns.

## 2017-08-01 NOTE — Assessment & Plan Note (Signed)
Continue to follow with pain management. Call with any concerns. We will increase her lyrica to 200mg  BID today. Recheck 1 month.

## 2017-08-01 NOTE — Assessment & Plan Note (Signed)
Advised her to call her cardiologist to follow up on the chest pain. Continue to monitor. Call with any concerns.

## 2017-08-03 LAB — 25-HYDROXY VITAMIN D LCMS D2+D3
25-Hydroxy, Vitamin D-2: <1 ng/mL
25-Hydroxy, Vitamin D: 14 ng/mL — ABNORMAL LOW

## 2017-08-03 LAB — 25-HYDROXYVITAMIN D LCMS D2+D3: 25-HYDROXY, VITAMIN D-3: 14 ng/mL

## 2017-08-05 ENCOUNTER — Ambulatory Visit: Payer: Medicare Other

## 2017-08-05 LAB — COMPLIANCE DRUG ANALYSIS, UR

## 2017-08-06 ENCOUNTER — Encounter: Payer: Self-pay | Admitting: Family Medicine

## 2017-08-06 ENCOUNTER — Other Ambulatory Visit: Payer: Self-pay

## 2017-08-06 ENCOUNTER — Other Ambulatory Visit: Payer: Self-pay | Admitting: Nurse Practitioner

## 2017-08-06 ENCOUNTER — Encounter: Payer: Self-pay | Admitting: *Deleted

## 2017-08-06 ENCOUNTER — Telehealth: Payer: Self-pay | Admitting: *Deleted

## 2017-08-06 DIAGNOSIS — I214 Non-ST elevation (NSTEMI) myocardial infarction: Secondary | ICD-10-CM

## 2017-08-06 DIAGNOSIS — Z955 Presence of coronary angioplasty implant and graft: Secondary | ICD-10-CM

## 2017-08-06 LAB — COMPREHENSIVE METABOLIC PANEL
A/G RATIO: 1.6 (ref 1.2–2.2)
ALT: 31 IU/L (ref 0–32)
AST: 22 IU/L (ref 0–40)
Albumin: 4.4 g/dL (ref 3.5–5.5)
Alkaline Phosphatase: 113 IU/L (ref 39–117)
BILIRUBIN TOTAL: 0.5 mg/dL (ref 0.0–1.2)
BUN / CREAT RATIO: 13 (ref 9–23)
BUN: 12 mg/dL (ref 6–24)
CALCIUM: 9.1 mg/dL (ref 8.7–10.2)
CHLORIDE: 101 mmol/L (ref 96–106)
CO2: 23 mmol/L (ref 20–29)
Creatinine, Ser: 0.91 mg/dL (ref 0.57–1.00)
GFR calc Af Amer: 88 mL/min/{1.73_m2} (ref >59)
GFR, EST NON AFRICAN AMERICAN: 76 mL/min/{1.73_m2} (ref >59)
GLOBULIN, TOTAL: 2.7 g/dL (ref 1.5–4.5)
Glucose: 87 mg/dL (ref 65–99)
POTASSIUM: 4.3 mmol/L (ref 3.5–5.2)
SODIUM: 140 mmol/L (ref 134–144)
TOTAL PROTEIN: 7.1 g/dL (ref 6.0–8.5)

## 2017-08-06 LAB — MAGNESIUM: MAGNESIUM: 2.2 mg/dL (ref 1.6–2.3)

## 2017-08-06 LAB — SEDIMENTATION RATE: Sed Rate: 19 mm/hr (ref 0–32)

## 2017-08-06 LAB — VITAMIN B12: Vitamin B-12: 548 pg/mL (ref 232–1245)

## 2017-08-06 LAB — C-REACTIVE PROTEIN: CRP: 1.1 mg/L (ref 0.0–4.9)

## 2017-08-06 MED ORDER — ERGOCALCIFEROL 1.25 MG (50000 UT) PO CAPS: 50000.0000 [IU] | ORAL_CAPSULE | ORAL | 0 refills | Status: AC

## 2017-08-06 MED ORDER — ERGOCALCIFEROL 1.25 MG (50000 UT) PO CAPS: 50000.0000 [IU] | ORAL_CAPSULE | ORAL | 0 refills | Status: DC
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
## 2017-08-06 NOTE — Telephone Encounter (Signed)
Reiss called and spoke with Olegario Messier.  She is currently undergoing pain management therapy and her physicians have asked her to hold off on rehab until her pain is better managed.  We will discharge her from program this time.

## 2017-08-07 ENCOUNTER — Ambulatory Visit: Payer: Medicare Other

## 2017-08-08 ENCOUNTER — Telehealth: Payer: Self-pay | Admitting: Cardiovascular Disease

## 2017-08-08 NOTE — Telephone Encounter (Signed)
Received records request Disabilty Determination Services, forwarded to [employer] for processing.  

## 2017-08-12 ENCOUNTER — Ambulatory Visit: Payer: Medicare Other

## 2017-08-12 DIAGNOSIS — Z7901 Long term (current) use of anticoagulants: Secondary | ICD-10-CM | POA: Insufficient documentation

## 2017-08-12 DIAGNOSIS — Z79899 Other long term (current) drug therapy: Secondary | ICD-10-CM | POA: Insufficient documentation
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
## 2017-08-13 ENCOUNTER — Ambulatory Visit: Payer: Medicare Other | Admitting: Pain Medicine

## 2017-08-14 ENCOUNTER — Ambulatory Visit: Payer: Medicare Other

## 2017-08-19 ENCOUNTER — Ambulatory Visit: Payer: Medicare Other

## 2017-08-21 ENCOUNTER — Ambulatory Visit: Payer: Medicare Other

## 2017-08-24 DIAGNOSIS — M79602 Pain in left arm: Secondary | ICD-10-CM

## 2017-08-24 DIAGNOSIS — M79601 Pain in right arm: Secondary | ICD-10-CM

## 2017-08-24 DIAGNOSIS — G8929 Other chronic pain: Secondary | ICD-10-CM | POA: Insufficient documentation

## 2017-08-24 NOTE — Progress Notes (Signed)
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 palpable anomalies  Lumbar Spine Area Exam  Skin & Axial Inspection: No masses, redness, or swelling Alignment: Symmetrical Functional ROM: Unrestricted ROM      Stability: No instability detected Muscle Tone/Strength: Functionally intact. No obvious neuro-muscular anomalies detected. Sensory (Neurological): Unimpaired Palpation: No palpable anomalies       Provocative Tests: Lumbar Hyperextension and rotation test: evaluation deferred today       Lumbar Lateral bending test: evaluation deferred today       Patrick's Maneuver: evaluation deferred today                    Gait & Posture Assessment  Ambulation: Unassisted Gait: Relatively normal for age and body habitus Posture: WNL   Lower Extremity Exam    Side: Right lower extremity  Side: Left lower extremity  Skin & Extremity Inspection: Skin color,  temperature, and hair growth are WNL. No peripheral edema or cyanosis. No masses, redness, swelling, asymmetry, or associated skin lesions. No contractures.  Skin & Extremity Inspection: Skin color, temperature, and hair growth are WNL. No peripheral edema or cyanosis. No masses, redness, swelling, asymmetry, or associated skin lesions. No contractures.  Functional ROM: Unrestricted ROM          Functional ROM: Unrestricted ROM          Muscle Tone/Strength: Functionally intact. No obvious neuro-muscular anomalies detected.  Muscle Tone/Strength: Functionally intact. No obvious neuro-muscular anomalies detected.  Sensory (Neurological): Unimpaired  Sensory (Neurological): Unimpaired  Palpation: No palpable anomalies  Palpation: No palpable anomalies   Assessment & Plan  Primary Diagnosis & Pertinent Problem List: The primary encounter diagnosis was Chronic generalized pain (Primary Area of Pain). Diagnoses of Chronic neck pain (Secondary Area of Pain) (R>L), Chronic low back pain (Tertiary Area of Pain) (L>R), Chronic pain syndrome, Chronic upper extremity pain (Fourth Area of Pain) (R>L), DDD (degenerative disc disease), lumbar, DDD (degenerative disc disease), thoracic, History of MI (myocardial infarction) (06/26/2017), and Long term current use of anticoagulant therapy (Plavix) were also pertinent to this visit.  Visit Diagnosis: 1. Chronic generalized pain (Primary Area of Pain)   2. Chronic neck pain (Secondary Area of Pain) (R>L)   3. Chronic low back pain [facility] Area of Pain) (L>R)   4. Chronic pain syndrome   5. Chronic upper extremity pain (Fourth Area of Pain) (R>L)   6. DDD (degenerative disc disease), lumbar   7. DDD (degenerative disc disease), thoracic   8. History of MI (myocardial infarction) (06/26/2017)   9. Long term current use of anticoagulant therapy (Plavix)    Problems updated and reviewed during this visit: Problem  Ddd (Degenerative Disc Disease), Lumbar  Ddd  (Degenerative Disc Disease), Thoracic  Ddd (Degenerative Disc Disease), Cervical  Long term current use of anticoagulant therapy (Plavix)  History of MI (myocardial infarction) (06/26/2017)    Plan of Care  Pharmacotherapy (Medications Ordered): No orders of the defined types were placed in this encounter.  Time Note: Greater than 50% of the 40 minute(s) of face-to-face time spent  with Ms. Borger, was spent in counseling/coordination of care regarding: the appropriate use of the pain scale, Ms. Zagal's primary cause of pain, the results of her recent test(s), the significance of each one oth the test(s) anomalies and it's corresponding characteristic pain pattern(s), the treatment plan, treatment alternatives and the risks and possible complications of proposed treatment.  Procedure Orders    No procedure(s) ordered today   Lab Orders  No laboratory test(s) ordered today   Imaging Orders  No imaging studies ordered today    Referral Orders     Ambulatory referral to Physical Therapy  Pharmacological management options:  Opioid Analgesics: We'll take over management today. See above orders Membrane stabilizer: We have discussed the possibility of optimizing this mode of therapy, if tolerated Muscle relaxant: We have discussed the possibility of a trial NSAID: We have discussed the possibility of a trial Other analgesic(s): To be determined at a later time   Interventional management options: Planned, scheduled, and/or pending:    The patient had a myocardial infarction around 06/26/2017 and therefore she is not a candidate for any interventional therapies until after 01/27/2018.    Considering:   Diagnostic cervical epidural steroid injection Diagnostic cervical facet injection Possible Cervical radiofrequency ablation Diagnostic lumbar epidural steroid injection Diagnostic lumbar facet injection Diagnostic Sacroiliac joint injection Possible Lumbar radiofrequency  ablation Diagnostic hip injection   PRN Procedures:   None at this time   Provider-requested follow-up: Return if symptoms worsen or fail to improve.  Future Appointments Date Time Provider [place]  09/04/2017 4:00 PM Valerie Roys, DO [facility] None    Primary Care Physician: Valerie Roys, DO Location: [facility] Note by: Gaspar Cola, MD Date: 08/25/2017; Time: 12:03 PM

## 2017-08-25 ENCOUNTER — Ambulatory Visit: Payer: Medicare Other | Attending: Pain Medicine | Admitting: Pain Medicine

## 2017-08-25 ENCOUNTER — Encounter: Payer: Self-pay | Admitting: Pain Medicine

## 2017-08-25 VITALS — BP 142/96 | HR 76 | Temp 97.8°F | Resp 16 | Ht 69.0 in | Wt 183.0 lb

## 2017-08-25 DIAGNOSIS — I252 Old myocardial infarction: Secondary | ICD-10-CM | POA: Insufficient documentation

## 2017-08-25 DIAGNOSIS — Z7902 Long term (current) use of antithrombotics/antiplatelets: Secondary | ICD-10-CM | POA: Insufficient documentation

## 2017-08-25 DIAGNOSIS — M47812 Spondylosis without myelopathy or radiculopathy, cervical region: Secondary | ICD-10-CM | POA: Insufficient documentation

## 2017-08-25 DIAGNOSIS — Z7901 Long term (current) use of anticoagulants: Secondary | ICD-10-CM

## 2017-08-25 DIAGNOSIS — Z7982 Long term (current) use of aspirin: Secondary | ICD-10-CM | POA: Insufficient documentation

## 2017-08-25 DIAGNOSIS — I10 Essential (primary) hypertension: Secondary | ICD-10-CM | POA: Insufficient documentation

## 2017-08-25 DIAGNOSIS — M5136 Other intervertebral disc degeneration, lumbar region: Secondary | ICD-10-CM | POA: Diagnosis not present

## 2017-08-25 DIAGNOSIS — F17200 Nicotine dependence, unspecified, uncomplicated: Secondary | ICD-10-CM | POA: Insufficient documentation

## 2017-08-25 DIAGNOSIS — G8929 Other chronic pain: Secondary | ICD-10-CM

## 2017-08-25 DIAGNOSIS — M542 Cervicalgia: Secondary | ICD-10-CM | POA: Diagnosis not present

## 2017-08-25 DIAGNOSIS — G4700 Insomnia, unspecified: Secondary | ICD-10-CM | POA: Diagnosis not present

## 2017-08-25 DIAGNOSIS — M79621 Pain in right upper arm: Secondary | ICD-10-CM | POA: Insufficient documentation

## 2017-08-25 DIAGNOSIS — Z955 Presence of coronary angioplasty implant and graft: Secondary | ICD-10-CM | POA: Insufficient documentation

## 2017-08-25 DIAGNOSIS — M5134 Other intervertebral disc degeneration, thoracic region: Secondary | ICD-10-CM | POA: Insufficient documentation

## 2017-08-25 DIAGNOSIS — M5030 Other cervical disc degeneration, unspecified cervical region: Secondary | ICD-10-CM | POA: Diagnosis not present

## 2017-08-25 DIAGNOSIS — M79622 Pain in left upper arm: Secondary | ICD-10-CM | POA: Insufficient documentation

## 2017-08-25 DIAGNOSIS — M79601 Pain in right arm: Secondary | ICD-10-CM | POA: Diagnosis not present

## 2017-08-25 DIAGNOSIS — M533 Sacrococcygeal disorders, not elsewhere classified: Secondary | ICD-10-CM | POA: Diagnosis not present

## 2017-08-25 DIAGNOSIS — F419 Anxiety disorder, unspecified: Secondary | ICD-10-CM | POA: Insufficient documentation

## 2017-08-25 DIAGNOSIS — M79602 Pain in left arm: Secondary | ICD-10-CM

## 2017-08-25 DIAGNOSIS — M546 Pain in thoracic spine: Secondary | ICD-10-CM | POA: Diagnosis present

## 2017-08-25 DIAGNOSIS — I2510 Atherosclerotic heart disease of native coronary artery without angina pectoris: Secondary | ICD-10-CM | POA: Diagnosis not present

## 2017-08-25 DIAGNOSIS — K219 Gastro-esophageal reflux disease without esophagitis: Secondary | ICD-10-CM | POA: Diagnosis not present

## 2017-08-25 DIAGNOSIS — G894 Chronic pain syndrome: Secondary | ICD-10-CM

## 2017-08-25 DIAGNOSIS — R52 Pain, unspecified: Secondary | ICD-10-CM

## 2017-08-25 DIAGNOSIS — M545 Low back pain: Secondary | ICD-10-CM

## 2017-08-25 DIAGNOSIS — E559 Vitamin D deficiency, unspecified: Secondary | ICD-10-CM | POA: Insufficient documentation

## 2017-08-25 DIAGNOSIS — Z79891 Long term (current) use of opiate analgesic: Secondary | ICD-10-CM | POA: Insufficient documentation

## 2017-08-25 DIAGNOSIS — M797 Fibromyalgia: Secondary | ICD-10-CM | POA: Insufficient documentation

## 2017-08-25 DIAGNOSIS — F329 Major depressive disorder, single episode, unspecified: Secondary | ICD-10-CM | POA: Insufficient documentation

## 2017-08-25 DIAGNOSIS — F4310 Post-traumatic stress disorder, unspecified: Secondary | ICD-10-CM | POA: Diagnosis not present

## 2017-08-25 DIAGNOSIS — E782 Mixed hyperlipidemia: Secondary | ICD-10-CM | POA: Diagnosis not present

## 2017-08-25 NOTE — Progress Notes (Signed)
Safety precautions to be maintained throughout the outpatient stay will include: orient to surroundings, keep bed in low position, maintain call bell within reach at all times, provide assistance with transfer out of bed and ambulation.  

## 2017-08-25 NOTE — Patient Instructions (Signed)
Take over-the-counter magnesium 500 mg at bedtime. If tolerated increase the dose to one pill at bedtime and one by mouth in the morning. Take vitamin D + calcium every day in the morning. ____________________________________________________________________________________________  Pain Scale  Introduction: The pain score used by this practice is the Verbal Numerical Rating Scale (VNRS-11). This is an 11-point scale. It is for adults and children 10 years or older. There are significant differences in how the pain score is reported, used, and applied. Forget everything you learned in the past and learn this scoring system.  General Information: The scale should reflect your current level of pain. Unless you are specifically asked for the level of your worst pain, or your average pain. If you are asked for one of these two, then it should be understood that it is over the past 24 hours.  Basic Activities of Daily Living (ADL): Personal hygiene, dressing, eating, transferring, and using restroom.  Instructions: Most patients tend to report their level of pain as a combination of two factors, their physical pain and their psychosocial pain. This last one is also known as "suffering" and it is reflection of how physical pain affects you socially and psychologically. From now on, report them separately. From this point on, when asked to report your pain level, report only your physical pain. Use the following table for reference.  Pain Clinic Pain Levels (0-5/10)  Pain Level Score  Description  No Pain 0   Mild pain 1 Nagging, annoying, but does not interfere with basic activities of daily living (ADL). Patients are able to eat, bathe, get dressed, toileting (being able to get on and off the toilet and perform personal hygiene functions), transfer (move in and out of bed or a chair without assistance), and maintain continence (able to control bladder and bowel functions). Blood pressure and heart rate are  unaffected. A normal heart rate for a healthy adult ranges from 60 to 100 bpm (beats per minute).   Mild to moderate pain 2 Noticeable and distracting. Impossible to hide from other people. More frequent flare-ups. Still possible to adapt and function close to normal. It can be very annoying and may have occasional stronger flare-ups. With discipline, patients may get used to it and adapt.   Moderate pain 3 Interferes significantly with activities of daily living (ADL). It becomes difficult to feed, bathe, get dressed, get on and off the toilet or to perform personal hygiene functions. Difficult to get in and out of bed or a chair without assistance. Very distracting. With effort, it can be ignored when deeply involved in activities.   Moderately severe pain 4 Impossible to ignore for more than a few minutes. With effort, patients may still be able to manage work or participate in some social activities. Very difficult to concentrate. Signs of autonomic nervous system discharge are evident: dilated pupils (mydriasis); mild sweating (diaphoresis); sleep interference. Heart rate becomes elevated (>115 bpm). Diastolic blood pressure (lower number) rises above 100 mmHg. Patients find relief in laying down and not moving.   Severe pain 5 Intense and extremely unpleasant. Associated with frowning face and frequent crying. Pain overwhelms the senses.  Ability to do any activity or maintain social relationships becomes significantly limited. Conversation becomes difficult. Pacing back and forth is common, as getting into a comfortable position is nearly impossible. Pain wakes you up from deep sleep. Physical signs will be obvious: pupillary dilation; increased sweating; goosebumps; brisk reflexes; cold, clammy hands and feet; nausea, vomiting or dry heaves;  loss of appetite; significant sleep disturbance with inability to fall asleep or to remain asleep. When persistent, significant weight loss is observed due to  the complete loss of appetite and sleep deprivation.  Blood pressure and heart rate becomes significantly elevated. Caution: If elevated blood pressure triggers a pounding headache associated with blurred vision, then the patient should immediately seek attention at an urgent or emergency care unit, as these may be signs of an impending stroke.    Emergency Department Pain Levels (6-10/10)  Emergency Room Pain 6 Severely limiting. Requires emergency care and should not be seen or managed at an outpatient pain management facility. Communication becomes difficult and requires great effort. Assistance to reach the emergency department may be required. Facial flushing and profuse sweating along with potentially dangerous increases in heart rate and blood pressure will be evident.   Distressing pain 7 Self-care is very difficult. Assistance is required to transport, or use restroom. Assistance to reach the emergency department will be required. Tasks requiring coordination, such as bathing and getting dressed become very difficult.   Disabling pain 8 Self-care is no longer possible. At this level, pain is disabling. The individual is unable to do even the most "basic" activities such as walking, eating, bathing, dressing, transferring to a bed, or toileting. Fine motor skills are lost. It is difficult to think clearly.   Incapacitating pain 9 Pain becomes incapacitating. Thought processing is no longer possible. Difficult to remember your own name. Control of movement and coordination are lost.   The worst pain imaginable 10 At this level, most patients pass out from pain. When this level is reached, collapse of the autonomic nervous system occurs, leading to a sudden drop in blood pressure and heart rate. This in turn results in a temporary and dramatic drop in blood flow to the brain, leading to a loss of consciousness. Fainting is one of the body's self defense mechanisms. Passing out puts the brain in a  calmed state and causes it to shut down for a while, in order to begin the healing process.    Summary: 1. Refer to this scale when providing [place] with your pain level. 2. Be accurate and careful when reporting your pain level. This will help with your care. 3. Over-reporting your pain level will lead to loss of credibility. 4. Even a level of 1/10 means that there is pain and will be treated at our facility. 5. High, inaccurate reporting will be documented as "Symptom Exaggeration", leading to loss of credibility and suspicions of possible secondary gains such as obtaining more narcotics, or wanting to appear disabled, for fraudulent reasons. 6. Only pain levels of 5 or below will be seen at our facility. 7. Pain levels of 6 and above will be sent to the Emergency Department and the appointment cancelled. ____________________________________________________________________________________________

## 2017-08-26 ENCOUNTER — Ambulatory Visit: Payer: Medicare Other

## 2017-08-28 ENCOUNTER — Ambulatory Visit: Payer: Medicare Other

## 2017-09-01 DIAGNOSIS — M542 Cervicalgia: Secondary | ICD-10-CM | POA: Diagnosis not present

## 2017-09-01 DIAGNOSIS — G8929 Other chronic pain: Secondary | ICD-10-CM | POA: Diagnosis not present

## 2017-09-02 ENCOUNTER — Ambulatory Visit: Payer: Medicare Other

## 2017-09-04 ENCOUNTER — Ambulatory Visit: Payer: Medicare Other

## 2017-09-04 ENCOUNTER — Ambulatory Visit (INDEPENDENT_AMBULATORY_CARE_PROVIDER_SITE_OTHER): Payer: Medicare Other | Admitting: Family Medicine

## 2017-09-04 ENCOUNTER — Encounter: Payer: Self-pay | Admitting: Family Medicine

## 2017-09-04 VITALS — BP 102/69 | HR 83 | Wt 178.0 lb

## 2017-09-04 DIAGNOSIS — M542 Cervicalgia: Secondary | ICD-10-CM | POA: Diagnosis not present

## 2017-09-04 DIAGNOSIS — M797 Fibromyalgia: Secondary | ICD-10-CM

## 2017-09-04 DIAGNOSIS — R109 Unspecified abdominal pain: Secondary | ICD-10-CM | POA: Diagnosis not present

## 2017-09-04 DIAGNOSIS — M7918 Myalgia, other site: Secondary | ICD-10-CM | POA: Diagnosis not present

## 2017-09-04 DIAGNOSIS — F4310 Post-traumatic stress disorder, unspecified: Secondary | ICD-10-CM

## 2017-09-04 DIAGNOSIS — G8929 Other chronic pain: Secondary | ICD-10-CM | POA: Diagnosis not present

## 2017-09-04 DIAGNOSIS — E782 Mixed hyperlipidemia: Secondary | ICD-10-CM | POA: Diagnosis not present

## 2017-09-04 NOTE — Assessment & Plan Note (Signed)
Continue to follow with pain management. Call with any concerns.  

## 2017-09-04 NOTE — Progress Notes (Signed)
BP 102/69   Pulse 83   Wt 178 lb (80.7 kg)   SpO2 98%   BMI 26.29 kg/m    Subjective:    Patient ID: Susan Macias, female    DOB: 01-Jun-1971, 46 y.o.   MRN: [id number]  HPI: Susan Macias is a 46 y.o. female  Chief Complaint  Patient presents with  . Pain   [name] presents today very upset. She states that her body is falling apart. She states that her pain is terrible and that she is feeling terrible. She is crying and very upset today.  She saw pain management a week and a half ago. Pain management discussed that they were going to take over opiate medication at that time, however, no opiates were written. They discussed the possibility of a trial of muscle relaxants and NSAIDS. She is not a candidate for interventional pain management options until after 01/27/18 due to her recent MI. They are considering a cervical epidural steroid injection, cervical facet injections, Cervical radiofrequency ablation, lumbar epidural steroid injection,lumbar facet injection, Sacroiliac joint injection, Lumbar radiofrequency ablation, andhip injection. They also referred her to PT and started her on vitamin D.   She states that the pain clinic started her on some vitamin D and calcium. She states that he is starting her on PT. She is going to do aquatherapy.  She had PT today and she is not feeling well as she is feeling sore today. She is happy with the pain clinic and she is planning on continuing to follow with them.   She is seeing a new psychiatrist shortly. She reported her last psychiatrist to the medical board as he prescribed her ativan and she was allergic to it. When she was back to the appointment, her BP went through the roof. She is looking forward to seeing the new psychiatrist.   She would like to see the rheumatologist to see if there is anything they can do for her fibro. She would like to see [facility]. Referral generated today. She would also like to have her cholesterol rechecked.  Normal 4 months ago. Tolerating the medicine well with no concerns. She does note that she has been having pain in her L flank. No burning when she pees. No increased urinary frequency. Otherwise feeling well. No other concerns or complaints at this time.   Relevant past medical, surgical, family and social history reviewed and updated as indicated. Interim medical history since our last visit reviewed. Allergies and medications reviewed and updated.  Review of Systems  Respiratory: Negative.   Cardiovascular: Negative.   Musculoskeletal: Positive for arthralgias, back pain, joint swelling, myalgias, neck pain and neck stiffness. Negative for gait problem.  Skin: Negative.   Neurological: Positive for weakness and numbness. Negative for dizziness, tremors, seizures, syncope, facial asymmetry, speech difficulty, light-headedness and headaches.  Psychiatric/Behavioral: Positive for dysphoric mood and sleep disturbance. Negative for agitation, behavioral problems, confusion, decreased concentration, hallucinations, self-injury and suicidal ideas. The patient is nervous/anxious. The patient is not hyperactive.     Per HPI unless specifically indicated above     Objective:    BP 102/69   Pulse 83   Wt 178 lb (80.7 kg)   SpO2 98%   BMI 26.29 kg/m   Wt Readings from Last 3 Encounters:  09/04/17 178 lb (80.7 kg)  08/25/17 183 lb (83 kg)  07/31/17 181 lb 7 oz (82.3 kg)    Physical Exam  Constitutional: She is oriented to person, place, and  time. She appears well-developed and well-nourished. No distress.  HENT:  Head: Normocephalic and atraumatic.  Right Ear: Hearing normal.  Left Ear: Hearing normal.  Nose: Nose normal.  Eyes: Conjunctivae and lids are normal. Right eye exhibits no discharge. Left eye exhibits no discharge. No scleral icterus.  Cardiovascular: Normal rate, regular rhythm, normal heart sounds and intact distal pulses.  Exam reveals no gallop and no friction rub.   No  murmur heard. Pulmonary/Chest: Effort normal and breath sounds normal. No respiratory distress. She has no wheezes. She has no rales. She exhibits no tenderness.  Musculoskeletal: Normal range of motion.  Neurological: She is alert and oriented to person, place, and time.  Skin: Skin is warm, dry and intact. No rash noted. She is not diaphoretic. No erythema. No pallor.  Psychiatric: She has a normal mood and affect. Her speech is normal and behavior is normal. Judgment and thought content normal. Cognition and memory are normal.  Nursing note and vitals reviewed.   Results for orders placed or performed in visit on 07/31/17  Microscopic Examination  Result Value Ref Range   WBC, UA 0-5 0 - 5 /hpf   RBC, UA 0-2 0 - 2 /hpf   Epithelial Cells (non renal) 0-10 0 - 10 /hpf   Mucus, UA Present (A) Not Estab.   Bacteria, UA None seen None seen/Few  UA/M w/rflx Culture, Routine  Result Value Ref Range   Specific Gravity, UA 1.025 1.005 - 1.030   pH, UA 6.0 5.0 - 7.5   Color, UA Yellow Yellow   Appearance Ur Cloudy (A) Clear   Leukocytes, UA Negative Negative   Protein, UA Negative Negative/Trace   Glucose, UA Negative Negative   Ketones, UA Trace (A) Negative   RBC, UA Trace (A) Negative   Bilirubin, UA Negative Negative   Urobilinogen, Ur 1.0 0.2 - 1.0 mg/dL   Nitrite, UA Negative Negative   Microscopic Examination See below:       Assessment & Plan:   Problem List Items Addressed This Visit      Other   Fibromyalgia - Primary (Chronic)    Continue to follow with pain management. Call with any concerns.       Relevant Orders   Ambulatory referral to Rheumatology   PTSD (post-traumatic stress disorder)    Continue to follow with psychiatry. Call with any concerns.       Mixed hyperlipidemia    Rechecking levels today. Await results. Call with any concerns.       Relevant Orders   Comprehensive metabolic panel   Lipid Panel w/o Chol/HDL Ratio   Myofascial pain     Would like to see rheumatology. Referral made today. Call with any concerns.      Relevant Orders   Ambulatory referral to Rheumatology    Other Visit Diagnoses    Flank pain       Will check UA- await results. Call with any concerns.    Relevant Orders   UA/M w/rflx Culture, Routine       Follow up plan: Return in about 4 weeks (around 10/02/2017).

## 2017-09-04 NOTE — Assessment & Plan Note (Signed)
Continue to follow with psychiatry. Call with any concerns.  ?

## 2017-09-04 NOTE — Assessment & Plan Note (Signed)
Rechecking levels today. Await results. Call with any concerns.  

## 2017-09-04 NOTE — Assessment & Plan Note (Signed)
Would like to see rheumatology. Referral made today. Call with any concerns.

## 2017-09-05 LAB — COMPREHENSIVE METABOLIC PANEL
A/G RATIO: 1.4 (ref 1.2–2.2)
ALT: 24 IU/L (ref 0–32)
AST: 20 IU/L (ref 0–40)
Albumin: 4.7 g/dL (ref 3.5–5.5)
Alkaline Phosphatase: 108 IU/L (ref 39–117)
BILIRUBIN TOTAL: 0.4 mg/dL (ref 0.0–1.2)
BUN/Creatinine Ratio: 21 (ref 9–23)
BUN: 22 mg/dL (ref 6–24)
CALCIUM: 9.7 mg/dL (ref 8.7–10.2)
CHLORIDE: 103 mmol/L (ref 96–106)
CO2: 21 mmol/L (ref 20–29)
Creatinine, Ser: 1.07 mg/dL — ABNORMAL HIGH (ref 0.57–1.00)
GFR, EST AFRICAN AMERICAN: 72 mL/min/{1.73_m2} (ref >59)
GFR, EST NON AFRICAN AMERICAN: 63 mL/min/{1.73_m2} (ref >59)
GLOBULIN, TOTAL: 3.3 g/dL (ref 1.5–4.5)
Glucose: 97 mg/dL (ref 65–99)
POTASSIUM: 4.5 mmol/L (ref 3.5–5.2)
SODIUM: 142 mmol/L (ref 134–144)
TOTAL PROTEIN: 8 g/dL (ref 6.0–8.5)

## 2017-09-05 LAB — MICROSCOPIC EXAMINATION: BACTERIA UA: NONE SEEN

## 2017-09-05 LAB — UA/M W/RFLX CULTURE, ROUTINE
Bilirubin, UA: NEGATIVE
Glucose, UA: NEGATIVE
KETONES UA: NEGATIVE
Leukocytes, UA: NEGATIVE
NITRITE UA: NEGATIVE
PH UA: 5.5 (ref 5.0–7.5)
Protein, UA: NEGATIVE
Specific Gravity, UA: 1.015 (ref 1.005–1.030)
UUROB: 0.2 mg/dL (ref 0.2–1.0)

## 2017-09-05 LAB — LIPID PANEL W/O CHOL/HDL RATIO
Cholesterol, Total: 139 mg/dL (ref 100–199)
HDL: 56 mg/dL (ref >39)
LDL Calculated: 67 mg/dL (ref 0–99)
Triglycerides: 78 mg/dL (ref 0–149)
VLDL Cholesterol Cal: 16 mg/dL (ref 5–40)

## 2017-09-09 ENCOUNTER — Ambulatory Visit: Payer: Medicare Other

## 2017-09-11 ENCOUNTER — Ambulatory Visit: Payer: Medicare Other

## 2017-09-16 ENCOUNTER — Ambulatory Visit: Payer: Medicare Other

## 2017-09-18 ENCOUNTER — Ambulatory Visit: Payer: Medicare Other

## 2017-09-23 ENCOUNTER — Ambulatory Visit: Payer: Medicare Other

## 2017-09-23 NOTE — Telephone Encounter (Signed)
Chart clean up

## 2017-09-25 ENCOUNTER — Ambulatory Visit: Payer: Medicare Other

## 2017-09-26 ENCOUNTER — Ambulatory Visit: Payer: Medicare Other

## 2017-09-30 ENCOUNTER — Other Ambulatory Visit: Payer: Self-pay | Admitting: Family Medicine

## 2017-09-30 ENCOUNTER — Ambulatory Visit: Payer: Medicare Other

## 2017-09-30 NOTE — Telephone Encounter (Signed)
There is an interaction warning for Prilosec and Plavix being taken together.  So I did not approve these 3 refills based on that warning.

## 2017-09-30 NOTE — Telephone Encounter (Signed)
Routing to provider  

## 2017-10-02 ENCOUNTER — Ambulatory Visit: Payer: Medicare Other

## 2017-10-02 DIAGNOSIS — M419 Scoliosis, unspecified: Secondary | ICD-10-CM | POA: Diagnosis not present

## 2017-10-02 DIAGNOSIS — M549 Dorsalgia, unspecified: Secondary | ICD-10-CM | POA: Diagnosis not present

## 2017-10-02 DIAGNOSIS — M791 Myalgia, unspecified site: Secondary | ICD-10-CM | POA: Diagnosis not present

## 2017-10-02 DIAGNOSIS — M545 Low back pain: Secondary | ICD-10-CM | POA: Diagnosis not present

## 2017-10-02 DIAGNOSIS — M797 Fibromyalgia: Secondary | ICD-10-CM | POA: Diagnosis not present

## 2017-10-03 ENCOUNTER — Ambulatory Visit: Payer: Medicare Other

## 2017-10-07 ENCOUNTER — Ambulatory Visit: Payer: Medicare Other

## 2017-10-09 ENCOUNTER — Ambulatory Visit: Payer: Medicare Other

## 2017-10-09 DIAGNOSIS — Z79899 Other long term (current) drug therapy: Secondary | ICD-10-CM | POA: Diagnosis not present

## 2017-10-14 ENCOUNTER — Ambulatory Visit: Payer: Medicare Other

## 2017-10-14 ENCOUNTER — Telehealth: Payer: Self-pay | Admitting: Pain Medicine

## 2017-10-14 NOTE — Telephone Encounter (Addendum)
Patient does not feel she is going to be a match with Dr. Laban Emperor. She has several diagnosis and does not feel he addressed them. She went to [facility].  Does not want to see Dr. Laban Emperor. She wants to see a different phys. Very upset. Feels like she was judged and possibly discrimated against.

## 2017-10-15 DIAGNOSIS — M545 Low back pain: Secondary | ICD-10-CM | POA: Diagnosis not present

## 2017-10-15 DIAGNOSIS — M791 Myalgia, unspecified site: Secondary | ICD-10-CM | POA: Diagnosis not present

## 2017-10-15 DIAGNOSIS — M797 Fibromyalgia: Secondary | ICD-10-CM | POA: Diagnosis not present

## 2017-10-16 ENCOUNTER — Ambulatory Visit: Payer: Medicare Other

## 2017-10-17 NOTE — Telephone Encounter (Signed)
Call returned to patient to discuss issues. She states she felt like she wasn't heard. She has a problem with her diagnosis. States she has severe fibromyalgia and degenerative disease. She has went for physical therapy that is not working. Has went to [facility] specialist who gave her medication and an injection that is helping. She wants to continue with them for now. I informed her that if her pain is better that is great and we will be here if she needs [place]. She was thankful and will call if needed.

## 2017-10-21 ENCOUNTER — Ambulatory Visit: Payer: Medicare Other

## 2017-10-29 ENCOUNTER — Other Ambulatory Visit: Payer: Self-pay | Admitting: Family Medicine

## 2017-10-31 ENCOUNTER — Other Ambulatory Visit: Payer: Self-pay | Admitting: Family Medicine

## 2017-11-10 ENCOUNTER — Ambulatory Visit: Payer: Self-pay

## 2017-11-10 ENCOUNTER — Ambulatory Visit: Payer: Self-pay | Admitting: Family Medicine

## 2017-11-13 ENCOUNTER — Ambulatory Visit: Payer: Self-pay

## 2017-11-14 ENCOUNTER — Other Ambulatory Visit: Payer: Self-pay | Admitting: Family Medicine

## 2017-11-20 ENCOUNTER — Ambulatory Visit (INDEPENDENT_AMBULATORY_CARE_PROVIDER_SITE_OTHER): Payer: Medicare Other

## 2017-11-20 VITALS — BP 113/81 | HR 83 | Temp 97.9°F | Resp 15 | Ht 69.0 in | Wt 186.5 lb

## 2017-11-20 DIAGNOSIS — Z0000 Encounter for general adult medical examination without abnormal findings: Secondary | ICD-10-CM

## 2017-11-20 NOTE — Patient Instructions (Addendum)
Susan Macias , Thank you for taking time to come for your Medicare Wellness Visit. I appreciate your ongoing commitment to your health goals. Please review the following plan we discussed and let me know if I can assist you in the future.   Screening recommendations/referrals: Colonoscopy: due at age 45  Mammogram: completed 11/12/2016 Bone Density: due at age 70 Recommended yearly ophthalmology/optometry visit for glaucoma screening and checkup Recommended yearly dental visit for hygiene and checkup  Vaccinations: Influenza vaccine: up to date Pneumococcal vaccine: due at age 20 Tdap vaccine:up to date  Shingles vaccine: due at age 45   Advanced directives: Advance directive discussed with you today. I have provided a copy for you to complete at home and have notarized. Once this is complete please bring a copy in to our office so we can scan it into your chart.  Conditions/risks identified: Recommend drinking at least 6-8 glasses of water a day.  Next appointment: Follow up with Dr.Johnson within the next month. Follow up in one year for your annual wellness exam.   Preventive Care 40-64 Years, Female Preventive care refers to lifestyle choices and visits with your health care provider that can promote health and wellness. What does preventive care include?  A yearly physical exam. This is also called an annual well check.  Dental exams once or twice a year.  Routine eye exams. Ask your health care provider how often you should have your eyes checked.  Personal lifestyle choices, including:  Daily care of your teeth and gums.  Regular physical activity.  Eating a healthy diet.  Avoiding tobacco and drug use.  Limiting alcohol use.  Practicing safe sex.  Taking low-dose aspirin daily starting at age 22.  Taking vitamin and mineral supplements as recommended by your health care provider. What happens during an annual well check? The services and screenings done by your  health care provider during your annual well check will depend on your age, overall health, lifestyle risk factors, and family history of disease. Counseling  Your health care provider may ask you questions about your:  Alcohol use.  Tobacco use.  Drug use.  Emotional well-being.  Home and relationship well-being.  Sexual activity.  Eating habits.  Work and work Statistician.  Method of birth control.  Menstrual cycle.  Pregnancy history. Screening  You may have the following tests or measurements:  Height, weight, and BMI.  Blood pressure.  Lipid and cholesterol levels. These may be checked every 5 years, or more frequently if you are over 58 years old.  Skin check.  Lung cancer screening. You may have this screening every year starting at age 46 if you have a 30-pack-year history of smoking and currently smoke or have quit within the past 15 years.  Fecal occult blood test (FOBT) of the stool. You may have this test every year starting at age 2.  Flexible sigmoidoscopy or colonoscopy. You may have a sigmoidoscopy every 5 years or a colonoscopy every 10 years starting at age 65.  Hepatitis C blood test.  Hepatitis B blood test.  Sexually transmitted disease (STD) testing.  Diabetes screening. This is done by checking your blood sugar (glucose) after you have not eaten for a while (fasting). You may have this done every 1-3 years.  Mammogram. This may be done every 1-2 years. Talk to your health care provider about when you should start having regular mammograms. This may depend on whether you have a family history of breast cancer.  BRCA-related  cancer screening. This may be done if you have a family history of breast, ovarian, tubal, or peritoneal cancers.  Pelvic exam and Pap test. This may be done every 3 years starting at age 65. Starting at age 39, this may be done every 5 years if you have a Pap test in combination with an HPV test.  Bone density scan.  This is done to screen for osteoporosis. You may have this scan if you are at high risk for osteoporosis. Discuss your test results, treatment options, and if necessary, the need for more tests with your health care provider. Vaccines  Your health care provider may recommend certain vaccines, such as:  Influenza vaccine. This is recommended every year.  Tetanus, diphtheria, and acellular pertussis (Tdap, Td) vaccine. You may need a Td booster every 10 years.  Zoster vaccine. You may need this after age 71.  Pneumococcal 13-valent conjugate (PCV13) vaccine. You may need this if you have certain conditions and were not previously vaccinated.  Pneumococcal polysaccharide (PPSV23) vaccine. You may need one or two doses if you smoke cigarettes or if you have certain conditions. Talk to your health care provider about which screenings and vaccines you need and how often you need them. This information is not intended to replace advice given to you by your health care provider. Make sure you discuss any questions you have with your health care provider. Document Released: 12/15/2015 Document Revised: 08/07/2016 Document Reviewed: 09/19/2015 Elsevier Interactive Patient Education  2017 [place] Prevention in the Home Falls can cause injuries. They can happen to people of all ages. There are many things you can do to make your home safe and to help prevent falls. What can I do on the outside of my home?  Regularly fix the edges of walkways and driveways and fix any cracks.  Remove anything that might make you trip as you walk through a door, such as a raised step or threshold.  Trim any bushes or trees on the path to your home.  Use bright outdoor lighting.  Clear any walking paths of anything that might make someone trip, such as rocks or tools.  Regularly check to see if handrails are loose or broken. Make sure that both sides of any steps have handrails.  Any raised decks  and porches should have guardrails on the edges.  Have any leaves, snow, or ice cleared regularly.  Use sand or salt on walking paths during winter.  Clean up any spills in your garage right away. This includes oil or grease spills. What can I do in the bathroom?  Use night lights.  Install grab bars by the toilet and in the tub and shower. Do not use towel bars as grab bars.  Use non-skid mats or decals in the tub or shower.  If you need to sit down in the shower, use a plastic, non-slip stool.  Keep the floor dry. Clean up any water that spills on the floor as soon as it happens.  Remove soap buildup in the tub or shower regularly.  Attach bath mats securely with double-sided non-slip rug tape.  Do not have throw rugs and other things on the floor that can make you trip. What can I do in the bedroom?  Use night lights.  Make sure that you have a light by your bed that is easy to reach.  Do not use any sheets or blankets that are too big for your bed. They should  not hang down onto the floor.  Have a firm chair that has side arms. You can use this for support while you get dressed.  Do not have throw rugs and other things on the floor that can make you trip. What can I do in the kitchen?  Clean up any spills right away.  Avoid walking on wet floors.  Keep items that you use a lot in easy-to-reach places.  If you need to reach something above you, use a strong step stool that has a grab bar.  Keep electrical cords out of the way.  Do not use floor polish or wax that makes floors slippery. If you must use wax, use non-skid floor wax.  Do not have throw rugs and other things on the floor that can make you trip. What can I do with my stairs?  Do not leave any items on the stairs.  Make sure that there are handrails on both sides of the stairs and use them. Fix handrails that are broken or loose. Make sure that handrails are as long as the stairways.  Check any  carpeting to make sure that it is firmly attached to the stairs. Fix any carpet that is loose or worn.  Avoid having throw rugs at the top or bottom of the stairs. If you do have throw rugs, attach them to the floor with carpet tape.  Make sure that you have a light switch at the top of the stairs and the bottom of the stairs. If you do not have them, ask someone to add them for you. What else can I do to help prevent falls?  Wear shoes that:  Do not have high heels.  Have rubber bottoms.  Are comfortable and fit you well.  Are closed at the toe. Do not wear sandals.  If you use a stepladder:  Make sure that it is fully opened. Do not climb a closed stepladder.  Make sure that both sides of the stepladder are locked into place.  Ask someone to hold it for you, if possible.  Clearly mark and make sure that you can see:  Any grab bars or handrails.  First and last steps.  Where the edge of each step is.  Use tools that help you move around (mobility aids) if they are needed. These include:  Canes.  Walkers.  Scooters.  Crutches.  Turn on the lights when you go into a dark area. Replace any light bulbs as soon as they burn out.  Set up your furniture so you have a clear path. Avoid moving your furniture around.  If any of your floors are uneven, fix them.  If there are any pets around you, be aware of where they are.  Review your medicines with your doctor. Some medicines can make you feel dizzy. This can increase your chance of falling. Ask your doctor what other things that you can do to help prevent falls. This information is not intended to replace advice given to you by your health care provider. Make sure you discuss any questions you have with your health care provider. Document Released: 09/14/2009 Document Revised: 04/25/2016 Document Reviewed: 12/23/2014 Elsevier Interactive Patient Education  2017 [place]  Self  Referral:  1. [facility] [place]   Address: [address], [place], [place] [address] Hours: Open today  9AM-7PM Phone: [phone]  2. [place] [place] Address: [address], [place], [place] [address] Phone: [phone]   [place]   Address: [place], [place], [place] [address] Hours: Tues 4:15PM-8PM // Weds 9am - 12pm // Raynelle Dick 1pm - 8pm (Closed other days) Phone: [phone]  [place]  Self Referral [facility]) [place] [address] [place], [place] [address] Phone: [phone]  [employer], available walk-in 9am-4pm M-F [place], [place] [address] Hours: [place] (M-F, walk in available) Phone:([phone]) [place]   Address: [place], [place], [place] [address] Hours: 8AM-5PM (accepts walk in to establish) Phone: [phone]     [facility] : Address: [address] [place], [place], [place] [address]  Phone: [phone]
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
## 2017-12-08 DIAGNOSIS — M546 Pain in thoracic spine: Secondary | ICD-10-CM | POA: Diagnosis not present

## 2017-12-08 DIAGNOSIS — M791 Myalgia, unspecified site: Secondary | ICD-10-CM | POA: Diagnosis not present

## 2017-12-08 DIAGNOSIS — M797 Fibromyalgia: Secondary | ICD-10-CM | POA: Diagnosis not present

## 2017-12-19 ENCOUNTER — Ambulatory Visit: Payer: Medicare Other | Admitting: Family Medicine

## 2017-12-24 ENCOUNTER — Ambulatory Visit (INDEPENDENT_AMBULATORY_CARE_PROVIDER_SITE_OTHER): Payer: Medicare Other | Admitting: Family Medicine

## 2017-12-24 ENCOUNTER — Encounter: Payer: Self-pay | Admitting: Family Medicine

## 2017-12-24 VITALS — BP 107/71 | HR 79 | Temp 97.4°F | Wt 190.0 lb

## 2017-12-24 DIAGNOSIS — N760 Acute vaginitis: Secondary | ICD-10-CM

## 2017-12-24 DIAGNOSIS — N898 Other specified noninflammatory disorders of vagina: Secondary | ICD-10-CM

## 2017-12-24 DIAGNOSIS — B9689 Other specified bacterial agents as the cause of diseases classified elsewhere: Secondary | ICD-10-CM

## 2017-12-24 DIAGNOSIS — R350 Frequency of micturition: Secondary | ICD-10-CM | POA: Diagnosis not present

## 2017-12-24 LAB — UA/M W/RFLX CULTURE, ROUTINE
Bilirubin, UA: NEGATIVE
Glucose, UA: NEGATIVE
Ketones, UA: NEGATIVE
Leukocytes, UA: NEGATIVE
NITRITE UA: NEGATIVE
Protein, UA: NEGATIVE
Specific Gravity, UA: 1.025 (ref 1.005–1.030)
UUROB: 0.2 mg/dL (ref 0.2–1.0)
pH, UA: 5.5 (ref 5.0–7.5)

## 2017-12-24 LAB — WET PREP FOR TRICH, YEAST, CLUE
CLUE CELL EXAM: POSITIVE — AB
Trichomonas Exam: NEGATIVE
Yeast Exam: NEGATIVE

## 2017-12-24 LAB — MICROSCOPIC EXAMINATION

## 2017-12-24 MED ORDER — METRONIDAZOLE 500 MG PO TABS: 500.0000 mg | ORAL_TABLET | Freq: Two times a day (BID) | ORAL | 0 refills | Status: DC

## 2017-12-24 NOTE — Progress Notes (Signed)
BP 107/71 (BP Location: Right Arm, Patient Position: Sitting, Cuff Size: Normal)   Pulse 79   Temp (!) 97.4 F (36.3 C) (Oral)   Wt 190 lb (86.2 kg)   SpO2 100%   BMI 28.06 kg/m    Subjective:    Patient ID: Susan Macias, female    DOB: May 11, 1971, 47 y.o.   MRN: [id number]  HPI: Susan Macias is a 47 y.o. female  Chief Complaint  Patient presents with  . Vaginal Discharge   VAGINAL DISCHARGE- has been having steroid shots and they have been helping a lot, but that they are causing yeast infect Duration: week and a half Discharge description: cottage cheese  Pruritus: yes Dysuria: no Malodorous: yes Urinary frequency: yes Fevers: no Abdominal pain: yes  Sexual activity: monogamous History of sexually transmitted diseases: no Recent antibiotic use: no Context: recurrent yeast infections  Treatments attempted: antifungal  Relevant past medical, surgical, family and social history reviewed and updated as indicated. Interim medical history since our last visit reviewed. Allergies and medications reviewed and updated.  Review of Systems  Constitutional: Negative.   Respiratory: Negative.   Cardiovascular: Negative.   Gastrointestinal: Positive for abdominal pain. Negative for abdominal distention, anal bleeding, blood in stool, constipation, diarrhea and rectal pain.  Genitourinary: Positive for frequency and vaginal discharge. Negative for decreased urine volume, difficulty urinating, dyspareunia, dysuria, enuresis, flank pain, genital sores, hematuria, menstrual problem, pelvic pain, urgency, vaginal bleeding and vaginal pain.  Psychiatric/Behavioral: Negative.     Per HPI unless specifically indicated above     Objective:    BP 107/71 (BP Location: Right Arm, Patient Position: Sitting, Cuff Size: Normal)   Pulse 79   Temp (!) 97.4 F (36.3 C) (Oral)   Wt 190 lb (86.2 kg)   SpO2 100%   BMI 28.06 kg/m   Wt Readings from Last 3 Encounters:  12/24/17 190  lb (86.2 kg)  11/20/17 186 lb 8 oz (84.6 kg)  09/04/17 178 lb (80.7 kg)    Physical Exam  Constitutional: She is oriented to person, place, and time. She appears well-developed and well-nourished. No distress.  HENT:  Head: Normocephalic and atraumatic.  Right Ear: Hearing normal.  Left Ear: Hearing normal.  Nose: Nose normal.  Eyes: Conjunctivae and lids are normal. Right eye exhibits no discharge. Left eye exhibits no discharge. No scleral icterus.  Pulmonary/Chest: Effort normal. No respiratory distress.  Abdominal: Soft. She exhibits no distension and no mass. There is no tenderness. There is no rebound and no guarding. Hernia confirmed negative in the right inguinal area and confirmed negative in the left inguinal area.  Genitourinary: No labial fusion. There is no rash, tenderness, lesion or injury on the right labia. There is no rash, tenderness, lesion or injury on the left labia. No erythema, tenderness or bleeding in the vagina. No foreign body in the vagina. No signs of injury around the vagina. Vaginal discharge found.  Musculoskeletal: Normal range of motion.  Neurological: She is alert and oriented to person, place, and time.  Skin: Skin is warm, dry and intact. No rash noted. She is not diaphoretic. No erythema. No pallor.  Psychiatric: She has a normal mood and affect. Her speech is normal and behavior is normal. Judgment and thought content normal. Cognition and memory are normal.  Nursing note and vitals reviewed.   Results for orders placed or performed in visit on 09/04/17  Microscopic Examination  Result Value Ref Range   WBC, UA 0-5 0 -  5 /hpf   RBC, UA 0-2 0 - 2 /hpf   Epithelial Cells (non renal) 0-10 0 - 10 /hpf   Bacteria, UA None seen None seen/Few  Comprehensive metabolic panel  Result Value Ref Range   Glucose 97 65 - 99 mg/dL   BUN 22 6 - 24 mg/dL   Creatinine, Ser 3.76 (H) 0.57 - 1.00 mg/dL   GFR calc non Af Amer 63 >59 mL/min/1.73   GFR calc Af Amer  72 >59 mL/min/1.73   BUN/Creatinine Ratio 21 9 - 23   Sodium 142 134 - 144 mmol/L   Potassium 4.5 3.5 - 5.2 mmol/L   Chloride 103 96 - 106 mmol/L   CO2 21 20 - 29 mmol/L   Calcium 9.7 8.7 - 10.2 mg/dL   Total Protein 8.0 6.0 - 8.5 g/dL   Albumin 4.7 3.5 - 5.5 g/dL   Globulin, Total 3.3 1.5 - 4.5 g/dL   Albumin/Globulin Ratio 1.4 1.2 - 2.2   Bilirubin Total 0.4 0.0 - 1.2 mg/dL   Alkaline Phosphatase 108 39 - 117 IU/L   AST 20 0 - 40 IU/L   ALT 24 0 - 32 IU/L  Lipid Panel w/o Chol/HDL Ratio  Result Value Ref Range   Cholesterol, Total 139 100 - 199 mg/dL   Triglycerides 78 0 - 149 mg/dL   HDL 56 >28 mg/dL   VLDL Cholesterol Cal 16 5 - 40 mg/dL   LDL Calculated 67 0 - 99 mg/dL  UA/M w/rflx Culture, Routine  Result Value Ref Range   Specific Gravity, UA 1.015 1.005 - 1.030   pH, UA 5.5 5.0 - 7.5   Color, UA Yellow Yellow   Appearance Ur Clear Clear   Leukocytes, UA Negative Negative   Protein, UA Negative Negative/Trace   Glucose, UA Negative Negative   Ketones, UA Negative Negative   RBC, UA Trace (A) Negative   Bilirubin, UA Negative Negative   Urobilinogen, Ur 0.2 0.2 - 1.0 mg/dL   Nitrite, UA Negative Negative   Microscopic Examination See below:       Assessment & Plan:   Problem List Items Addressed This Visit    None    Visit Diagnoses    BV (bacterial vaginosis)    -  Primary   Will treat with metronidazole. Call with any concerns or if not getting better   Relevant Medications   metroNIDAZOLE (FLAGYL) 500 MG tablet   Vaginal discharge       + clue cells. Will treat for BV   Relevant Orders   WET PREP FOR TRICH, YEAST, CLUE   Urinary frequency       Normal UA- likely from BV. Call with any concerns.    Relevant Orders   UA/M w/rflx Culture, Routine       Follow up plan: Return if symptoms worsen or fail to improve.

## 2017-12-24 NOTE — Patient Instructions (Addendum)

## 2017-12-30 ENCOUNTER — Other Ambulatory Visit: Payer: Self-pay | Admitting: Family Medicine

## 2017-12-30 DIAGNOSIS — Z1231 Encounter for screening mammogram for malignant neoplasm of breast: Secondary | ICD-10-CM
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
## 2018-01-12 ENCOUNTER — Ambulatory Visit: Payer: Medicare Other | Admitting: Cardiovascular Disease
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
## 2018-01-23 ENCOUNTER — Ambulatory Visit (INDEPENDENT_AMBULATORY_CARE_PROVIDER_SITE_OTHER): Payer: Medicare Other | Admitting: Cardiovascular Disease

## 2018-01-23 ENCOUNTER — Encounter: Payer: Self-pay | Admitting: Cardiovascular Disease

## 2018-01-23 VITALS — BP 100/78 | HR 84 | Ht 69.0 in | Wt 195.0 lb

## 2018-01-23 DIAGNOSIS — I10 Essential (primary) hypertension: Secondary | ICD-10-CM | POA: Diagnosis not present

## 2018-01-23 DIAGNOSIS — G894 Chronic pain syndrome: Secondary | ICD-10-CM

## 2018-01-23 DIAGNOSIS — M797 Fibromyalgia: Secondary | ICD-10-CM

## 2018-01-23 DIAGNOSIS — I25118 Atherosclerotic heart disease of native coronary artery with other forms of angina pectoris: Secondary | ICD-10-CM

## 2018-01-23 DIAGNOSIS — E782 Mixed hyperlipidemia: Secondary | ICD-10-CM | POA: Diagnosis not present

## 2018-01-23 DIAGNOSIS — F17200 Nicotine dependence, unspecified, uncomplicated: Secondary | ICD-10-CM | POA: Diagnosis not present

## 2018-01-23 NOTE — Patient Instructions (Signed)

## 2018-01-25 ENCOUNTER — Other Ambulatory Visit: Payer: Self-pay | Admitting: Family Medicine

## 2018-01-26 NOTE — Telephone Encounter (Signed)
OK to call in

## 2018-01-27 NOTE — Telephone Encounter (Signed)
Medication called in 

## 2018-02-09 ENCOUNTER — Other Ambulatory Visit: Payer: Self-pay | Admitting: Family Medicine

## 2018-02-12 ENCOUNTER — Other Ambulatory Visit: Payer: Self-pay | Admitting: Family Medicine

## 2018-02-12 NOTE — Telephone Encounter (Signed)
Lisinopril  Refill  Request  LOV 12-24-2017  Pharmacy is  Requesting  a  90 day  Supply  For insurance  Purposes  Pharmacy  [facility] in [place]

## 2018-02-13 ENCOUNTER — Encounter: Payer: Self-pay | Admitting: Family Medicine

## 2018-02-13 ENCOUNTER — Other Ambulatory Visit: Payer: Self-pay

## 2018-02-13 ENCOUNTER — Ambulatory Visit (INDEPENDENT_AMBULATORY_CARE_PROVIDER_SITE_OTHER): Payer: Medicare Other | Admitting: Family Medicine

## 2018-02-13 VITALS — BP 149/96 | HR 103 | Wt 194.3 lb

## 2018-02-13 DIAGNOSIS — N898 Other specified noninflammatory disorders of vagina: Secondary | ICD-10-CM | POA: Diagnosis not present

## 2018-02-13 DIAGNOSIS — E782 Mixed hyperlipidemia: Secondary | ICD-10-CM

## 2018-02-13 DIAGNOSIS — R7301 Impaired fasting glucose: Secondary | ICD-10-CM | POA: Diagnosis not present

## 2018-02-13 DIAGNOSIS — R300 Dysuria: Secondary | ICD-10-CM

## 2018-02-13 DIAGNOSIS — B9689 Other specified bacterial agents as the cause of diseases classified elsewhere: Secondary | ICD-10-CM | POA: Diagnosis not present

## 2018-02-13 DIAGNOSIS — I25118 Atherosclerotic heart disease of native coronary artery with other forms of angina pectoris: Secondary | ICD-10-CM | POA: Diagnosis not present

## 2018-02-13 DIAGNOSIS — Z113 Encounter for screening for infections with a predominantly sexual mode of transmission: Secondary | ICD-10-CM

## 2018-02-13 DIAGNOSIS — K219 Gastro-esophageal reflux disease without esophagitis: Secondary | ICD-10-CM | POA: Diagnosis not present

## 2018-02-13 DIAGNOSIS — N760 Acute vaginitis: Secondary | ICD-10-CM

## 2018-02-13 DIAGNOSIS — I10 Essential (primary) hypertension: Secondary | ICD-10-CM | POA: Diagnosis not present

## 2018-02-13 LAB — UA/M W/RFLX CULTURE, ROUTINE
Bilirubin, UA: NEGATIVE
Glucose, UA: NEGATIVE
Ketones, UA: NEGATIVE
LEUKOCYTES UA: NEGATIVE
Nitrite, UA: NEGATIVE
PH UA: 6 (ref 5.0–7.5)
PROTEIN UA: NEGATIVE
SPEC GRAV UA: 1.015 (ref 1.005–1.030)
Urobilinogen, Ur: 0.2 mg/dL (ref 0.2–1.0)

## 2018-02-13 LAB — WET PREP FOR TRICH, YEAST, CLUE
Clue Cell Exam: POSITIVE — AB
Trichomonas Exam: NEGATIVE
YEAST EXAM: NEGATIVE

## 2018-02-13 LAB — MICROSCOPIC EXAMINATION: Bacteria, UA: NONE SEEN

## 2018-02-13 LAB — BAYER DCA HB A1C WAIVED: HB A1C (BAYER DCA - WAIVED): 5.6 % (ref <7.0)

## 2018-02-13 MED ORDER — METRONIDAZOLE 500 MG PO TABS: 500.0000 mg | ORAL_TABLET | Freq: Two times a day (BID) | ORAL | 0 refills | Status: DC

## 2018-02-13 MED ORDER — MELOXICAM 7.5 MG PO TABS: 7.5000 mg | ORAL_TABLET | Freq: Every day | ORAL | 3 refills | Status: DC

## 2018-02-13 MED ORDER — LISINOPRIL 5 MG PO TABS: 5.0000 mg | ORAL_TABLET | Freq: Every day | ORAL | 1 refills | Status: DC

## 2018-02-13 MED ORDER — ATORVASTATIN CALCIUM 80 MG PO TABS: ORAL_TABLET | ORAL | 1 refills | Status: DC

## 2018-02-13 MED ORDER — CLOPIDOGREL BISULFATE 75 MG PO TABS: 75.0000 mg | ORAL_TABLET | Freq: Every day | ORAL | 3 refills | Status: DC

## 2018-02-13 NOTE — Assessment & Plan Note (Signed)
Rechecking levels today. Continue to monitor. Call with any concerns.  

## 2018-02-13 NOTE — Progress Notes (Signed)
BP (!) 149/96   Pulse (!) 103   Wt 194 lb 4.8 oz (88.1 kg)   SpO2 100%   BMI 28.69 kg/m    Subjective:    Patient ID: Susan Macias, female    DOB: [birth year], 47 y.o.   MRN: [id number]  HPI: [name] is a 47 y.o. female  Chief Complaint  Patient presents with  . Vaginal Discharge    pt states she has been having vaginal discharge and itching  . Labs Only    pt requests full STD panel   VAGINAL DISCHARGE Duration: weeks Discharge description: clear  Pruritus: yes Dysuria: yes Malodorous: yes Urinary frequency: yes Fevers: no Abdominal pain: yes  Sexual activity: concerned about STIs History of sexually transmitted diseases: no Recent antibiotic use: no Context: recurrent BV  Treatments attempted: none  STD SCREENING Sexual activity:  In a Monogamous Relationship, but just found out that her fiance has been cheating on her.  Contraception: yes Recent unprotected intercourse: yes History of sexually transmitted diseases: no Previous sexually transmitted disease screening: yes Genital lesions: no Vaginal discharge: yes Dysuria: yes Swollen lymph nodes: no Fevers: no Rash: no  HYPERTENSION / HYPERLIPIDEMIA Satisfied with current treatment? yes Duration of hypertension: chronic BP monitoring frequency: not checking BP medication side effects: no Duration of hyperlipidemia: chronic Cholesterol medication side effects: no Cholesterol supplements: none Past cholesterol medications: atorvastatin Medication compliance: excellent compliance Aspirin: yes Recent stressors: yes Recurrent headaches: no Visual changes: no Palpitations: no Dyspnea: no Chest pain: no Lower extremity edema: no Dizzy/lightheaded: no  Relevant past medical, surgical, family and social history reviewed and updated as indicated. Interim medical history since our last visit reviewed. Allergies and medications reviewed and updated.  Review of Systems  Constitutional:  Negative.   Respiratory: Negative.   Cardiovascular: Negative.   Genitourinary: Positive for dysuria, frequency and vaginal discharge. Negative for decreased urine volume, difficulty urinating, dyspareunia, enuresis, flank pain, genital sores, hematuria, menstrual problem, pelvic pain, urgency, vaginal bleeding and vaginal pain.  Neurological: Negative.   Psychiatric/Behavioral: Positive for dysphoric mood. Negative for agitation, behavioral problems, confusion, decreased concentration, hallucinations, self-injury, sleep disturbance and suicidal ideas. The patient is nervous/anxious. The patient is not hyperactive.     Per HPI unless specifically indicated above     Objective:    BP (!) 149/96   Pulse (!) 103   Wt 194 lb 4.8 oz (88.1 kg)   SpO2 100%   BMI 28.69 kg/m   Wt Readings from Last 3 Encounters:  02/13/18 194 lb 4.8 oz (88.1 kg)  01/23/18 195 lb (88.5 kg)  12/24/17 190 lb (86.2 kg)    Physical Exam  Constitutional: She is oriented to person, place, and time. She appears well-developed and well-nourished. No distress.  HENT:  Head: Normocephalic and atraumatic.  Right Ear: Hearing normal.  Left Ear: Hearing normal.  Nose: Nose normal.  Eyes: Conjunctivae and lids are normal. Right eye exhibits no discharge. Left eye exhibits no discharge. No scleral icterus.  Cardiovascular: Normal rate, regular rhythm, normal heart sounds and intact distal pulses. Exam reveals no gallop and no friction rub.  No murmur heard. Pulmonary/Chest: Effort normal and breath sounds normal. No respiratory distress. She has no wheezes. She has no rales. She exhibits no tenderness.  Abdominal: Hernia confirmed negative in the right inguinal area and confirmed negative in the left inguinal area.  Genitourinary: Uterus normal. Rectal exam shows guaiac negative stool. No labial fusion. There is no rash, tenderness, lesion or  injury on the right labia. There is no rash, tenderness, lesion or injury on the  left labia. No erythema, tenderness or bleeding in the vagina. No foreign body in the vagina. No signs of injury around the vagina. Vaginal discharge found.  Musculoskeletal: Normal range of motion.  Neurological: She is alert and oriented to person, place, and time. She has normal reflexes.  Skin: Skin is warm, dry and intact. No rash noted. She is not diaphoretic. No erythema. No pallor.  Psychiatric: She has a normal mood and affect. Her speech is normal and behavior is normal. Judgment and thought content normal. Cognition and memory are normal.    Results for orders placed or performed in visit on 12/24/17  WET PREP FOR TRICH, YEAST, CLUE  Result Value Ref Range   Trichomonas Exam Negative Negative   Yeast Exam Negative Negative   Clue Cell Exam Positive (A) Negative  Microscopic Examination  Result Value Ref Range   WBC, UA 0-5 0 - 5 /hpf   RBC, UA 0-2 0 - 2 /hpf   Epithelial Cells (non renal) >10 (A) 0 - 10 /hpf   Mucus, UA Present Not Estab.   Bacteria, UA Few None seen/Few  UA/M w/rflx Culture, Routine  Result Value Ref Range   Specific Gravity, UA 1.025 1.005 - 1.030   pH, UA 5.5 5.0 - 7.5   Color, UA Orange Yellow   Appearance Ur Cloudy (A) Clear   Leukocytes, UA Negative Negative   Protein, UA Negative Negative/Trace   Glucose, UA Negative Negative   Ketones, UA Negative Negative   RBC, UA Trace (A) Negative   Bilirubin, UA Negative Negative   Urobilinogen, Ur 0.2 0.2 - 1.0 mg/dL   Nitrite, UA Negative Negative   Microscopic Examination See below:       Assessment & Plan:   Problem List Items Addressed This Visit      Cardiovascular and Mediastinum   Essential hypertension    Under good control on recheck. Continue current regimen. Continue to monitor. Call with any concerns.       Relevant Medications   lisinopril (PRINIVIL,ZESTRIL) 5 MG tablet   atorvastatin (LIPITOR) 80 MG tablet   Other Relevant Orders   CBC with Differential/Platelet   Comprehensive  metabolic panel   TSH   CAD (coronary artery disease)    Continues to follow with cardiology. Doing well. No chest pain. Call with any concerns.       Relevant Medications   lisinopril (PRINIVIL,ZESTRIL) 5 MG tablet   atorvastatin (LIPITOR) 80 MG tablet   Other Relevant Orders   CBC with Differential/Platelet   Comprehensive metabolic panel   Lipid Panel w/o Chol/HDL Ratio     Digestive   GERD (gastroesophageal reflux disease)    Stable. Continue current regimen. Continue to monitor. Call with any concerns.       Relevant Orders   CBC with Differential/Platelet   Comprehensive metabolic panel     Endocrine   IFG (impaired fasting glucose)    Rechecking levels today. Continue to monitor. Call with any concerns.       Relevant Orders   CBC with Differential/Platelet   Comprehensive metabolic panel   TSH   Bayer DCA Hb A1c Waived     Other   Mixed hyperlipidemia    Stable on current regimen. Refills given today. Rechecking levels. Call with any concerns.       Relevant Medications   lisinopril (PRINIVIL,ZESTRIL) 5 MG tablet   atorvastatin (LIPITOR) 80  MG tablet    Other Visit Diagnoses    Routine screening for STI (sexually transmitted infection)    -  Primary   Labs drawn today. Await results.    Relevant Orders   GC/Chlamydia Probe Amp   HIV antibody   HSV(herpes simplex vrs) 1+2 ab-IgG   Hepatitis, Acute   RPR   WET PREP FOR TRICH, YEAST, CLUE   Vaginal discharge       +Clue cells. Await results.    Relevant Orders   WET PREP FOR TRICH, YEAST, CLUE   Dysuria       UA checked today.   Relevant Orders   UA/M w/rflx Culture, Routine   BV (bacterial vaginosis)       Will treat with metronidazole. Call with any concerns.    Relevant Medications   metroNIDAZOLE (FLAGYL) 500 MG tablet       Follow up plan: No Follow-up on file.

## 2018-02-13 NOTE — Assessment & Plan Note (Signed)
Continues to follow with cardiology. Doing well. No chest pain. Call with any concerns.

## 2018-02-13 NOTE — Assessment & Plan Note (Signed)
Stable on current regimen. Refills given today. Rechecking levels. Call with any concerns.

## 2018-02-13 NOTE — Assessment & Plan Note (Signed)
Stable. Continue current regimen. Continue to monitor. Call with any concerns.  

## 2018-02-13 NOTE — Assessment & Plan Note (Signed)
Under good control on recheck. Continue current regimen. Continue to monitor. Call with any concerns.  

## 2018-02-14 LAB — CBC WITH DIFFERENTIAL/PLATELET
Basophils Absolute: 0 10*3/uL (ref 0.0–0.2)
Basos: 0 %
EOS (ABSOLUTE): 0.1 10*3/uL (ref 0.0–0.4)
Eos: 1 %
Hematocrit: 42 % (ref 34.0–46.6)
Hemoglobin: 13.6 g/dL (ref 11.1–15.9)
IMMATURE GRANS (ABS): 0 10*3/uL (ref 0.0–0.1)
IMMATURE GRANULOCYTES: 0 %
LYMPHS: 38 %
Lymphocytes Absolute: 2 10*3/uL (ref 0.7–3.1)
MCH: 31 pg (ref 26.6–33.0)
MCHC: 32.4 g/dL (ref 31.5–35.7)
MCV: 96 fL (ref 79–97)
MONOS ABS: 0.4 10*3/uL (ref 0.1–0.9)
Monocytes: 8 %
NEUTROS PCT: 53 %
Neutrophils Absolute: 2.9 10*3/uL (ref 1.4–7.0)
Platelets: 283 10*3/uL (ref 150–379)
RBC: 4.39 x10E6/uL (ref 3.77–5.28)
RDW: 15.5 % — AB (ref 12.3–15.4)
WBC: 5.4 10*3/uL (ref 3.4–10.8)

## 2018-02-14 LAB — COMPREHENSIVE METABOLIC PANEL
A/G RATIO: 1.4 (ref 1.2–2.2)
ALT: 20 IU/L (ref 0–32)
AST: 16 IU/L (ref 0–40)
Albumin: 4.3 g/dL (ref 3.5–5.5)
Alkaline Phosphatase: 93 IU/L (ref 39–117)
BUN/Creatinine Ratio: 14 (ref 9–23)
BUN: 13 mg/dL (ref 6–24)
Bilirubin Total: 0.3 mg/dL (ref 0.0–1.2)
CALCIUM: 9.5 mg/dL (ref 8.7–10.2)
CO2: 25 mmol/L (ref 20–29)
Chloride: 100 mmol/L (ref 96–106)
Creatinine, Ser: 0.9 mg/dL (ref 0.57–1.00)
GFR calc Af Amer: 89 mL/min/{1.73_m2} (ref >59)
GFR, EST NON AFRICAN AMERICAN: 77 mL/min/{1.73_m2} (ref >59)
Globulin, Total: 3 g/dL (ref 1.5–4.5)
Glucose: 104 mg/dL — ABNORMAL HIGH (ref 65–99)
POTASSIUM: 4.4 mmol/L (ref 3.5–5.2)
Sodium: 142 mmol/L (ref 134–144)
Total Protein: 7.3 g/dL (ref 6.0–8.5)

## 2018-02-14 LAB — HEPATITIS PANEL, ACUTE
HEP B C IGM: NEGATIVE
Hep A IgM: NEGATIVE
Hepatitis B Surface Ag: NEGATIVE

## 2018-02-14 LAB — TSH: TSH: 2.31 u[IU]/mL (ref 0.450–4.500)

## 2018-02-14 LAB — LIPID PANEL W/O CHOL/HDL RATIO
Cholesterol, Total: 151 mg/dL (ref 100–199)
HDL: 62 mg/dL (ref >39)
LDL Calculated: 68 mg/dL (ref 0–99)
TRIGLYCERIDES: 106 mg/dL (ref 0–149)
VLDL CHOLESTEROL CAL: 21 mg/dL (ref 5–40)

## 2018-02-14 LAB — HIV ANTIBODY (ROUTINE TESTING W REFLEX): HIV SCREEN 4TH GENERATION: NONREACTIVE

## 2018-02-14 LAB — RPR: RPR Ser Ql: NONREACTIVE

## 2018-02-14 LAB — HSV(HERPES SIMPLEX VRS) I + II AB-IGG
HSV 1 Glycoprotein G Ab, IgG: <0.91 index (ref 0.00–0.90)
HSV 2 IgG, Type Spec: 11.5 index — ABNORMAL HIGH (ref 0.00–0.90)

## 2018-02-15 LAB — GC/CHLAMYDIA PROBE AMP
Chlamydia trachomatis, NAA: NEGATIVE
Neisseria gonorrhoeae by PCR: NEGATIVE

## 2018-02-16 ENCOUNTER — Telehealth: Payer: Self-pay | Admitting: Family Medicine

## 2018-02-16 NOTE — Telephone Encounter (Signed)
Message relayed to patient. Verbalized understanding and denied questions.   

## 2018-02-16 NOTE — Telephone Encounter (Signed)
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 Thanks!

## 2018-02-18 ENCOUNTER — Telehealth: Payer: Self-pay

## 2018-02-18 DIAGNOSIS — Z598 Other problems related to housing and economic circumstances: Secondary | ICD-10-CM

## 2018-02-18 DIAGNOSIS — Z599 Problem related to housing and economic circumstances, unspecified: Secondary | ICD-10-CM

## 2018-02-18 NOTE — Telephone Encounter (Signed)
Patient called requesting information on resources for helping her with some bills and buying furniture. She has moved into a new place but because of the deposit she is scared she wont be able to afford her bills this month. Will send referral to connected care to see if any resources are available currently.

## 2018-02-18 NOTE — Telephone Encounter (Signed)
-----   Message from Richarda Overlie, [place] sent at 02/16/2018  9:39 AM EDT ----- Jeanene Erb and spoke with patient in regards to labs. When d/c the call she asked if the "wellness nurse" was in for her to speak to. I advised that you were w/ patients, but I would send message requesting you contact her at your convience.  Callback: [phone]

## 2018-02-19 ENCOUNTER — Ambulatory Visit: Payer: Medicare Other | Admitting: Family Medicine

## 2018-02-26 ENCOUNTER — Ambulatory Visit: Payer: Medicare Other | Admitting: Family Medicine

## 2018-03-05 ENCOUNTER — Ambulatory Visit: Payer: Medicare Other | Admitting: Family Medicine

## 2018-03-11 DIAGNOSIS — M542 Cervicalgia: Secondary | ICD-10-CM | POA: Diagnosis not present

## 2018-03-11 DIAGNOSIS — G894 Chronic pain syndrome: Secondary | ICD-10-CM | POA: Diagnosis not present

## 2018-03-11 DIAGNOSIS — M545 Low back pain: Secondary | ICD-10-CM | POA: Diagnosis not present

## 2018-03-11 DIAGNOSIS — M791 Myalgia, unspecified site: Secondary | ICD-10-CM | POA: Diagnosis not present

## 2018-03-13 ENCOUNTER — Encounter: Payer: Self-pay | Admitting: Family Medicine

## 2018-03-13 MED ORDER — VALACYCLOVIR HCL 1 G PO TABS: 1000.0000 mg | ORAL_TABLET | Freq: Two times a day (BID) | ORAL | 0 refills | Status: DC

## 2018-04-14 ENCOUNTER — Encounter: Payer: Self-pay | Admitting: Family Medicine

## 2018-04-15 ENCOUNTER — Encounter: Payer: Self-pay | Admitting: Family Medicine

## 2018-04-15 ENCOUNTER — Ambulatory Visit (INDEPENDENT_AMBULATORY_CARE_PROVIDER_SITE_OTHER): Payer: Medicare Other | Admitting: Family Medicine

## 2018-04-15 VITALS — BP 124/95 | HR 94 | Temp 98.2°F | Wt 189.7 lb

## 2018-04-15 DIAGNOSIS — M797 Fibromyalgia: Secondary | ICD-10-CM | POA: Diagnosis not present

## 2018-04-15 DIAGNOSIS — K219 Gastro-esophageal reflux disease without esophagitis: Secondary | ICD-10-CM | POA: Diagnosis not present

## 2018-04-15 DIAGNOSIS — F331 Major depressive disorder, recurrent, moderate: Secondary | ICD-10-CM

## 2018-04-15 DIAGNOSIS — N760 Acute vaginitis: Secondary | ICD-10-CM

## 2018-04-15 DIAGNOSIS — B9689 Other specified bacterial agents as the cause of diseases classified elsewhere: Secondary | ICD-10-CM | POA: Diagnosis not present

## 2018-04-15 DIAGNOSIS — R8290 Unspecified abnormal findings in urine: Secondary | ICD-10-CM | POA: Diagnosis not present

## 2018-04-15 DIAGNOSIS — N898 Other specified noninflammatory disorders of vagina: Secondary | ICD-10-CM | POA: Diagnosis not present

## 2018-04-15 LAB — WET PREP FOR TRICH, YEAST, CLUE
CLUE CELL EXAM: POSITIVE — AB
TRICHOMONAS EXAM: NEGATIVE
YEAST EXAM: NEGATIVE

## 2018-04-15 LAB — MICROSCOPIC EXAMINATION: BACTERIA UA: NONE SEEN

## 2018-04-15 LAB — UA/M W/RFLX CULTURE, ROUTINE
Bilirubin, UA: NEGATIVE
Glucose, UA: NEGATIVE
Leukocytes, UA: NEGATIVE
NITRITE UA: NEGATIVE
PH UA: 5 (ref 5.0–7.5)
Protein, UA: NEGATIVE
Specific Gravity, UA: 1.015 (ref 1.005–1.030)
UUROB: 0.2 mg/dL (ref 0.2–1.0)

## 2018-04-15 MED ORDER — METRONIDAZOLE 500 MG PO TABS: 500.0000 mg | ORAL_TABLET | Freq: Two times a day (BID) | ORAL | 0 refills | Status: DC

## 2018-04-15 MED ORDER — AMITRIPTYLINE HCL 25 MG PO TABS: ORAL_TABLET | ORAL | 1 refills | Status: DC

## 2018-04-15 MED ORDER — OMEPRAZOLE 20 MG PO CPDR: 20.0000 mg | DELAYED_RELEASE_CAPSULE | Freq: Every day | ORAL | 1 refills | Status: DC

## 2018-04-15 MED ORDER — PREGABALIN 200 MG PO CAPS: 200.0000 mg | ORAL_CAPSULE | Freq: Two times a day (BID) | ORAL | 4 refills | Status: DC

## 2018-04-15 NOTE — Progress Notes (Signed)
BP (!) 124/95 (BP Location: Right Arm, Patient Position: Sitting, Cuff Size: Normal)   Pulse 94   Temp 98.2 F (36.8 C) (Oral)   Wt 189 lb 11.2 oz (86 kg)   SpO2 100%   BMI 28.01 kg/m    Subjective:    Patient ID: Susan Macias, female    DOB: May 06, 1971, 47 y.o.   MRN: [id number]  HPI: Susan Macias is a 47 y.o. female  Chief Complaint  Patient presents with  . Vaginal Odor    Patient states has strong vaginal odor. See's discharge when she urinates. Patient does admit to douching.   . Leg, Ankle, Hand Pain    Patient sees [facility]. Get's injections in her neck and is helping. But for 3 weeks she has pain in her legs, ankles and hands. Patient wondering if there's another specialist like rhematology she needs to see.  . Gastroesophageal Reflux    Refill on PPI. Omeprazole did not work.   VAGINAL ODOR Duration: 2 weeks  Discharge description: none  Pruritus: yes Dysuria: no Malodorous: yes Urinary frequency: yes Fevers: no Abdominal pain: yes  Sexual activity: practicing safe sex History of sexually transmitted diseases: no Recent antibiotic use: no Context: recurrent BV  Treatments attempted: none  Has been seeing the spine surgeon and getting shots. Has been having pain in her legs and in her wrists. She notes that her back is feeling better, but that her wrists and her legs are hurting a whole whole lot.   GERD GERD control status: uncontrolled- has been at least 6 months  Satisfied with current treatment? no Heartburn frequency: constant Medication side effects: no  Medication compliance: poor Previous GERD medications: omeprazole Dysphagia: no Odynophagia:  no Hematemesis: no Blood in stool: no EGD: no   Relevant past medical, surgical, family and social history reviewed and updated as indicated. Interim medical history since our last visit reviewed. Allergies and medications reviewed and updated.  Review of Systems  Constitutional:  Negative.   Respiratory: Negative.   Cardiovascular: Negative.   Genitourinary: Positive for vaginal discharge. Negative for decreased urine volume, difficulty urinating, dyspareunia, dysuria, enuresis, flank pain, frequency, genital sores, hematuria, menstrual problem, pelvic pain, urgency, vaginal bleeding and vaginal pain.  Musculoskeletal: Positive for arthralgias, joint swelling and myalgias. Negative for back pain, gait problem, neck pain and neck stiffness.  Skin: Negative.   Psychiatric/Behavioral: Negative.     Per HPI unless specifically indicated above     Objective:    BP (!) 124/95 (BP Location: Right Arm, Patient Position: Sitting, Cuff Size: Normal)   Pulse 94   Temp 98.2 F (36.8 C) (Oral)   Wt 189 lb 11.2 oz (86 kg)   SpO2 100%   BMI 28.01 kg/m   Wt Readings from Last 3 Encounters:  04/15/18 189 lb 11.2 oz (86 kg)  02/13/18 194 lb 4.8 oz (88.1 kg)  01/23/18 195 lb (88.5 kg)    Physical Exam  Constitutional: She is oriented to person, place, and time. She appears well-developed and well-nourished. No distress.  HENT:  Head: Normocephalic and atraumatic.  Right Ear: Hearing normal.  Left Ear: Hearing normal.  Nose: Nose normal.  Eyes: Conjunctivae and lids are normal. Right eye exhibits no discharge. Left eye exhibits no discharge. No scleral icterus.  Cardiovascular: Normal rate, regular rhythm, normal heart sounds and intact distal pulses. Exam reveals no gallop and no friction rub.  No murmur heard. Pulmonary/Chest: Effort normal and breath sounds normal. No stridor.  No respiratory distress. She has no wheezes. She has no rales. She exhibits no tenderness.  Genitourinary: Vaginal discharge found.  Musculoskeletal: Normal range of motion.  Neurological: She is alert and oriented to person, place, and time.  Skin: Skin is warm, dry and intact. Capillary refill takes less than 2 seconds. No rash noted. She is not diaphoretic. No erythema. No pallor.    Psychiatric: She has a normal mood and affect. Her speech is normal and behavior is normal. Judgment and thought content normal. Cognition and memory are normal.  Nursing note and vitals reviewed.   Results for orders placed or performed in visit on 04/15/18  WET PREP FOR TRICH, YEAST, CLUE  Result Value Ref Range   Trichomonas Exam Negative Negative   Yeast Exam Negative Negative   Clue Cell Exam Positive (A) Negative  Microscopic Examination  Result Value Ref Range   WBC, UA 0-5 0 - 5 /hpf   RBC, UA 0-2 0 - 2 /hpf   Epithelial Cells (non renal) 0-10 0 - 10 /hpf   Mucus, UA Present Not Estab.   Bacteria, UA None seen None seen/Few  UA/M w/rflx Culture, Routine  Result Value Ref Range   Specific Gravity, UA 1.015 1.005 - 1.030   pH, UA 5.0 5.0 - 7.5   Color, UA Yellow Yellow   Appearance Ur Clear Clear   Leukocytes, UA Negative Negative   Protein, UA Negative Negative/Trace   Glucose, UA Negative Negative   Ketones, UA Trace (A) Negative   RBC, UA 3+ (A) Negative   Bilirubin, UA Negative Negative   Urobilinogen, Ur 0.2 0.2 - 1.0 mg/dL   Nitrite, UA Negative Negative   Microscopic Examination See below:       Assessment & Plan:   Problem List Items Addressed This Visit      Digestive   GERD (gastroesophageal reflux disease)    Will refill her omprazole. Call with any concerns. Recheck 1 month.       Relevant Medications   omeprazole (PRILOSEC) 20 MG capsule     Other   Fibromyalgia (Chronic)    Continue to follow with pain management. Will restart her amitriptyline. Call with any concerns. Continue lyrica. Call with any concerns.       Moderate episode of recurrent major depressive disorder (HCC)   Relevant Medications   amitriptyline (ELAVIL) 25 MG tablet    Other Visit Diagnoses    BV (bacterial vaginosis)    -  Primary   Will treat with metronidazole. Call with any concerns.    Relevant Medications   metroNIDAZOLE (FLAGYL) 500 MG tablet   Cloudy urine        UA clear. No sign of UTI. Likely due to vaginal discharge.    Relevant Orders   UA/M w/rflx Culture, Routine (Completed)   WET PREP FOR TRICH, YEAST, CLUE (Completed)   Vaginal odor       + clue cells   Relevant Orders   WET PREP FOR TRICH, YEAST, CLUE (Completed)       Follow up plan: Return in about 1 month (around 05/13/2018).

## 2018-04-18 ENCOUNTER — Encounter: Payer: Self-pay | Admitting: Family Medicine

## 2018-04-18 NOTE — Assessment & Plan Note (Addendum)
Continue to follow with pain management. Will restart her amitriptyline. Call with any concerns. Continue lyrica. Call with any concerns.

## 2018-04-18 NOTE — Assessment & Plan Note (Signed)
Will refill her omprazole. Call with any concerns. Recheck 1 month.

## 2018-05-01 ENCOUNTER — Other Ambulatory Visit: Payer: Self-pay | Admitting: Family Medicine

## 2018-06-07 ENCOUNTER — Other Ambulatory Visit: Payer: Self-pay | Admitting: Family Medicine

## 2018-06-10 ENCOUNTER — Ambulatory Visit: Payer: Medicare Other | Admitting: Family Medicine

## 2018-06-14 ENCOUNTER — Other Ambulatory Visit: Payer: Self-pay | Admitting: Family Medicine

## 2018-06-15 ENCOUNTER — Telehealth: Payer: Self-pay | Admitting: Family Medicine

## 2018-06-15 DIAGNOSIS — Z789 Other specified health status: Secondary | ICD-10-CM

## 2018-06-15 NOTE — Telephone Encounter (Signed)
Copied from CRM [id number]. Topic: Quick Communication - See Telephone Encounter >> Jun 15, 2018  2:25 PM Arlyss Gandy, NT wrote: CRM for notification. See Telephone encounter for: 06/15/18. Pt is requesting for the [facility] nurse to contact her about some things going on with her. She would not disclose any more information. CB#: [phone]

## 2018-06-16 NOTE — Telephone Encounter (Signed)
Patient called stating she needs housing assistance, the house she moved too is in bad shape and states the landlord is not fixing anything such as the [facility] and rats in the house. She is requesting to find new housing - states she is doing okay financially but just cant handle this housing situation anymore and doesn't know where to find new housing. Will put urgent referral into connected care.   Best callback number is [phone].

## 2018-06-25 ENCOUNTER — Telehealth: Payer: Self-pay | Admitting: Cardiovascular Disease

## 2018-06-25 NOTE — Telephone Encounter (Signed)
Patient last seen 01/23/18

## 2018-06-25 NOTE — Telephone Encounter (Signed)
° °  [facility] Pre-operative Risk Assessment    Request for surgical clearance:  1. What type of surgery is being performed? Restorative extractions if needed preventative   2. When is this surgery scheduled? Not noted   3. What type of clearance is required (medical clearance vs. Pharmacy clearance to hold med vs. Both)? both  4. Are there any medications that need to be held prior to surgery and how long?please advise   5. Practice name and name of physician performing surgery? [facility]    6. What is your office phone number [phone]    7.   What is your office fax number [phone]   8.   Anesthesia type (None, local, MAC, general) ? Unknown    Clarisse Gouge 06/25/2018, 2:16 PM  _________________________________________________________________   (provider comments below)

## 2018-06-28 NOTE — Telephone Encounter (Signed)
If dentist requires, She can hold plavix 5 days prior to procedure Stay on asa Back on plavix after procedure

## 2018-06-29 NOTE — Telephone Encounter (Signed)
Routed to number provided via EPIC fax.  

## 2018-06-30 DIAGNOSIS — G894 Chronic pain syndrome: Secondary | ICD-10-CM | POA: Diagnosis not present

## 2018-06-30 DIAGNOSIS — M545 Low back pain: Secondary | ICD-10-CM | POA: Diagnosis not present

## 2018-06-30 DIAGNOSIS — M7918 Myalgia, other site: Secondary | ICD-10-CM | POA: Diagnosis not present

## 2018-06-30 DIAGNOSIS — M542 Cervicalgia: Secondary | ICD-10-CM | POA: Diagnosis not present

## 2018-07-22 ENCOUNTER — Telehealth: Payer: Self-pay | Admitting: Family Medicine

## 2018-07-22 NOTE — Telephone Encounter (Signed)
Needs to contact Dr. Allena Katz or have an appt

## 2018-07-22 NOTE — Telephone Encounter (Signed)
Spoke with Donnelly Angelica, pharmacist at [facility]; informed him that Dr Allena Katz is not in the  [facility];  he says that this prescription is no longer active.

## 2018-07-22 NOTE — Telephone Encounter (Signed)
Patient calling to see if the doctor was able to call in her medications.

## 2018-07-22 NOTE — Telephone Encounter (Signed)
Copied from CRM [id number]. Topic: Quick Communication - Rx Refill/Question >> Jul 22, 2018  8:49 AM Baldo Daub L wrote: Medication: nitroGLYCERIN (NITROSTAT) 0.4 MG SL tablet - states that they got thrown away  Has the patient contacted their pharmacy? Yes - pharmacy will not refill, states they need information from a Dr. Allena Katz but pt doesn't know who that is. (Agent: If no, request that the patient contact the pharmacy for the refill.) (Agent: If yes, when and what did the pharmacy advise?)  Preferred Pharmacy (with phone number or street name): [facility] [address] [place] - [address] [phone] (Phone) [phone] (Fax)  Agent: Please be advised that RX refills may take up to 3 business days. We ask that you follow-up with your pharmacy.

## 2018-07-23 ENCOUNTER — Other Ambulatory Visit: Payer: Self-pay | Admitting: Family Medicine

## 2018-07-23 NOTE — Telephone Encounter (Addendum)
LVM for patient. Personalized VM stating "you have reached [phone]"  Stated this message is for [facility] and to please return our call.

## 2018-07-24 NOTE — Telephone Encounter (Signed)
Nitrostat refill Last Refill:07/04/17 # 20 with 12 refills Last OV: 04/15/18 PCP: Dr. Laural Benes [facility]acy[facility] [address].

## 2018-07-28 NOTE — Telephone Encounter (Signed)
Tried calling patient again, no answer.  LVM for patient to return call.  Patient either needs an appointment or needs to see Dr. Allena Katz.

## 2018-07-29 ENCOUNTER — Emergency Department
Admission: EM | Admit: 2018-07-29 | Discharge: 2018-07-29 | Disposition: A | Discharge status: Home / Self Care | Payer: Medicare Other | Attending: Emergency Medicine | Admitting: Emergency Medicine

## 2018-07-29 ENCOUNTER — Encounter: Payer: Self-pay | Admitting: Emergency Medicine

## 2018-07-29 DIAGNOSIS — I110 Hypertensive heart disease with heart failure: Secondary | ICD-10-CM | POA: Diagnosis not present

## 2018-07-29 DIAGNOSIS — Z7902 Long term (current) use of antithrombotics/antiplatelets: Secondary | ICD-10-CM | POA: Diagnosis not present

## 2018-07-29 DIAGNOSIS — I5032 Chronic diastolic (congestive) heart failure: Secondary | ICD-10-CM | POA: Diagnosis not present

## 2018-07-29 DIAGNOSIS — Z79899 Other long term (current) drug therapy: Secondary | ICD-10-CM | POA: Insufficient documentation

## 2018-07-29 DIAGNOSIS — Z7982 Long term (current) use of aspirin: Secondary | ICD-10-CM | POA: Insufficient documentation

## 2018-07-29 DIAGNOSIS — E782 Mixed hyperlipidemia: Secondary | ICD-10-CM | POA: Insufficient documentation

## 2018-07-29 DIAGNOSIS — Z7689 Persons encountering health services in other specified circumstances: Secondary | ICD-10-CM

## 2018-07-29 DIAGNOSIS — Z87891 Personal history of nicotine dependence: Secondary | ICD-10-CM | POA: Diagnosis not present

## 2018-07-29 DIAGNOSIS — Z202 Contact with and (suspected) exposure to infections with a predominantly sexual mode of transmission: Secondary | ICD-10-CM | POA: Diagnosis not present

## 2018-07-29 DIAGNOSIS — I252 Old myocardial infarction: Secondary | ICD-10-CM | POA: Diagnosis not present

## 2018-07-29 DIAGNOSIS — I2510 Atherosclerotic heart disease of native coronary artery without angina pectoris: Secondary | ICD-10-CM | POA: Insufficient documentation

## 2018-07-29 LAB — URINALYSIS, COMPLETE (UACMP) WITH MICROSCOPIC
BACTERIA UA: NONE SEEN
Bilirubin Urine: NEGATIVE
Glucose, UA: NEGATIVE mg/dL
Ketones, ur: NEGATIVE mg/dL
Leukocytes, UA: NEGATIVE
Nitrite: NEGATIVE
Protein, ur: NEGATIVE mg/dL
SPECIFIC GRAVITY, URINE: 1.021 (ref 1.005–1.030)
pH: 5 (ref 5.0–8.0)

## 2018-07-29 LAB — WET PREP, GENITAL
Clue Cells Wet Prep HPF POC: NONE SEEN
Sperm: NONE SEEN
TRICH WET PREP: NONE SEEN
Yeast Wet Prep HPF POC: NONE SEEN

## 2018-07-29 LAB — POCT PREGNANCY, URINE: Preg Test, Ur: NEGATIVE

## 2018-07-29 MED ORDER — FLUCONAZOLE 150 MG PO TABS: 150.0000 mg | ORAL_TABLET | Freq: Once | ORAL | 0 refills | Status: AC

## 2018-07-29 NOTE — ED Notes (Signed)
See triage note  States her b/f informed last pm that he was exposed to a possible STD  She denies any sxs'

## 2018-07-29 NOTE — ED Triage Notes (Signed)
Pt reports her boyfriend cheated on her and the girl he cheated with had a STD and she wants to be checked for everything. Pt denies sx's but wants to be checked.

## 2018-07-29 NOTE — Discharge Instructions (Addendum)
Your tests are negative. Follow-up with your provider as needed.
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
## 2018-07-31 NOTE — Telephone Encounter (Signed)
Yes please send letter.

## 2018-07-31 NOTE — Telephone Encounter (Signed)
No phone call back, no answer. Send letter or wait?

## 2018-08-04 NOTE — Telephone Encounter (Signed)
Letter generated and sent.

## 2018-08-05 ENCOUNTER — Other Ambulatory Visit: Payer: Self-pay | Admitting: Family Medicine

## 2018-08-06 NOTE — Telephone Encounter (Signed)
Flagyl 500 mg refill request Last Refill:04/15/18 # 14 tabs  Last OV: 04/15/18 PCP: Laural Benes [id number]  Valtrex 1 Gram tablets refill request 03/13/18  #20 LOV:  04/15/18 PCP:  Laural Benes [id number]    Was for f/u around 05/13/18 no OV noted;  06/10/18 No Show

## 2018-08-12 ENCOUNTER — Ambulatory Visit: Payer: Medicare Other | Admitting: Family Medicine

## 2018-08-13 ENCOUNTER — Telehealth: Payer: Self-pay

## 2018-08-13 NOTE — Telephone Encounter (Signed)
Copied from CRM [id number]. Topic: Medicare AWV >> Aug 13, 2018 10:31 AM Marin Roberts A, LPN wrote: Reason for CRM: called to reschedule medicare wellness visit from 11/23/2018 to either a Thursday with the [facility] filling in or can be moved to February if patient would like. Eligible anytime after 11/20/2018.  Note: Nurse Health Advisor-Tiffany Hill,LPN with be on Maternity Leave 10/02/2018-12/25/2018, All Medicare Annual Wellness visits between 10/02/2018 and 12/25/2018 need to be scheduled on a Thursday with the substitute Nurse Health Advisor- Aida Puffer. Please call or skype Manuela Schwartz with any questions. [phone].

## 2018-08-17 ENCOUNTER — Ambulatory Visit: Payer: Medicare Other | Admitting: Family Medicine

## 2018-08-28 ENCOUNTER — Ambulatory Visit: Payer: Medicare Other | Admitting: Family Medicine

## 2018-09-04 ENCOUNTER — Other Ambulatory Visit: Payer: Self-pay | Admitting: Family Medicine

## 2018-09-04 NOTE — Telephone Encounter (Signed)
Please review for:      DRUG INTERACTION BETWEEN CLOPIDOGREL AND OMEPRAZOLE. PLEASE CHANGE HEARTBURN MEDICATION TO PANTOPRAZOLE Unable to find documentation were the pantoprazole is recommended in the chart.  PCP:  Olevia Perches NOV 09/10/18 Refill request for omeprazole 20 mg

## 2018-09-10 ENCOUNTER — Telehealth: Payer: Self-pay | Admitting: Family Medicine

## 2018-09-10 ENCOUNTER — Ambulatory Visit: Payer: Medicare Other | Admitting: Family Medicine

## 2018-09-10 NOTE — Telephone Encounter (Signed)
Copied from CRM [id number]. Topic: Quick Communication - Appointment Cancellation >> Sep 10, 2018  8:14 AM Trula Slade wrote: Patient called to cancel appointment scheduled for patient she is having a flare up this morning and she can't make her appt.  She is going to the emergency room instead.  Patient has rescheduled their appointment.  Route to department's PEC pool.

## 2018-09-10 NOTE — Telephone Encounter (Signed)
Noted  

## 2018-09-15 ENCOUNTER — Ambulatory Visit: Payer: Medicare Other | Admitting: Family Medicine

## 2018-09-24 ENCOUNTER — Other Ambulatory Visit: Payer: Self-pay | Admitting: Family Medicine

## 2018-09-24 NOTE — Telephone Encounter (Signed)
Requested Prescriptions  Pending Prescriptions Disp Refills  . atorvastatin (LIPITOR) 80 MG tablet [Pharmacy Med Name: ATORVASTATIN 80 MG TABLET] 90 tablet 0    Sig: TAKE 1 TABLET DAILY AT 6PM     Cardiovascular:  Antilipid - Statins Passed - 09/24/2018  2:04 AM      Passed - Total Cholesterol in normal range and within 360 days    Cholesterol, Total  Date Value Ref Range Status  02/13/2018 151 100 - 199 mg/dL Final         Passed - LDL in normal range and within 360 days    LDL Calculated  Date Value Ref Range Status  02/13/2018 68 0 - 99 mg/dL Final         Passed - HDL in normal range and within 360 days    HDL  Date Value Ref Range Status  02/13/2018 62 >39 mg/dL Final         Passed - Triglycerides in normal range and within 360 days    Triglycerides  Date Value Ref Range Status  02/13/2018 106 0 - 149 mg/dL Final         Passed - Patient is not pregnant      Passed - Valid encounter within last 12 months    Recent Outpatient Visits          5 months ago BV (bacterial vaginosis)   [facility], [name], DO   7 months ago Routine screening for STI (sexually transmitted infection)   [facility], [name], DO   9 months ago BV (bacterial vaginosis)   [facility], [name], DO   1 year ago Fibromyalgia   [facility] [place], [place], DO   1 year ago Foul smelling urine   [facility], [place], DO      Future Appointments            In 4 days Laural Benes, Oralia Rud, DO [facility], PEC   In 3 months  [facility], PEC

## 2018-09-25 ENCOUNTER — Telehealth: Payer: Self-pay | Admitting: Family Medicine
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
## 2018-09-28 ENCOUNTER — Ambulatory Visit: Payer: Medicare Other | Admitting: Family Medicine

## 2018-09-30 ENCOUNTER — Telehealth: Payer: Self-pay

## 2018-09-30 NOTE — Telephone Encounter (Signed)
Copied from CRM [id number]. Topic: General - Other >> Sep 23, 2018  8:48 AM Baldo Daub L wrote: Reason for CRM:   Pt called in requesting to speak with the Wellness Nurse about some things going on at her home.  Pt would not leave any more of a message than that.  Pt states she can be reached at [phone].    Called and spoke with patient per patient request, she states she just wanted to talk. She has an appt coming up with Dr.Johnson and has been in a tremendous amount of pain especially with the weather. She also states she spoke with Samara Deist about her heating bill and is still looking for a new place as she knows she needs to move. She mentioned being scared of falling getting in and out of the bathtub- she declined any assistance with grab bars, showers chairs etc at this time as she wants to move. She also mentions feeling a little depression as she has been going through so much recently. She will discuss with Dr.Johnosn at her next visit but we discussed self referral options to behavioral health fo a counselor and discussed need to seek immediate help if she feels suicidal or homicidal. patient verbalized understanding and was very appreciative of the time we spent on the phone talking. She stated before getting off the phone that she already felt much better having someone to talk to.    Will route to Dr.Johnson as Lorain Childes and Manuela Schwartz who will follow up with her on 10/02/2018 per last telephone encounter.   Counselor information provided:   Self Referral [facility]) [place] [address], [place], [place] [address] Phone: [phone]  [employer], available walk-in 9am-4pm M-F [address] [place], [place] [address] Hours: 9am - 4pm (M-F, walk in available) [phone]  [facility]   Address: [address] [place], [place], [place] [address] Hours: 8AM-5PM (accepts walk in to establish) Phone: [phone]

## 2018-10-14 ENCOUNTER — Ambulatory Visit (INDEPENDENT_AMBULATORY_CARE_PROVIDER_SITE_OTHER): Payer: Medicare Other | Admitting: Family Medicine

## 2018-10-14 ENCOUNTER — Encounter: Payer: Self-pay | Admitting: Family Medicine

## 2018-10-14 VITALS — BP 118/82 | HR 100 | Temp 97.5°F | Ht 69.0 in | Wt 224.1 lb

## 2018-10-14 DIAGNOSIS — R202 Paresthesia of skin: Secondary | ICD-10-CM | POA: Diagnosis not present

## 2018-10-14 DIAGNOSIS — I10 Essential (primary) hypertension: Secondary | ICD-10-CM

## 2018-10-14 DIAGNOSIS — K219 Gastro-esophageal reflux disease without esophagitis: Secondary | ICD-10-CM | POA: Diagnosis not present

## 2018-10-14 DIAGNOSIS — K5909 Other constipation: Secondary | ICD-10-CM

## 2018-10-14 DIAGNOSIS — R7301 Impaired fasting glucose: Secondary | ICD-10-CM

## 2018-10-14 DIAGNOSIS — E782 Mixed hyperlipidemia: Secondary | ICD-10-CM

## 2018-10-14 DIAGNOSIS — F331 Major depressive disorder, recurrent, moderate: Secondary | ICD-10-CM

## 2018-10-14 DIAGNOSIS — M797 Fibromyalgia: Secondary | ICD-10-CM

## 2018-10-14 DIAGNOSIS — G4700 Insomnia, unspecified: Secondary | ICD-10-CM

## 2018-10-14 DIAGNOSIS — E559 Vitamin D deficiency, unspecified: Secondary | ICD-10-CM

## 2018-10-14 LAB — MICROALBUMIN, URINE WAIVED
Creatinine, Urine Waived: 200 mg/dL (ref 10–300)
Microalb, Ur Waived: 30 mg/L — ABNORMAL HIGH (ref 0–19)

## 2018-10-14 LAB — UA/M W/RFLX CULTURE, ROUTINE
Bilirubin, UA: NEGATIVE
GLUCOSE, UA: NEGATIVE
Ketones, UA: NEGATIVE
Leukocytes, UA: NEGATIVE
Nitrite, UA: NEGATIVE
PH UA: 7 (ref 5.0–7.5)
PROTEIN UA: NEGATIVE
RBC, UA: NEGATIVE
SPEC GRAV UA: 1.02 (ref 1.005–1.030)
Urobilinogen, Ur: 2 mg/dL — ABNORMAL HIGH (ref 0.2–1.0)

## 2018-10-14 MED ORDER — ATORVASTATIN CALCIUM 80 MG PO TABS: ORAL_TABLET | ORAL | 1 refills | Status: DC

## 2018-10-14 MED ORDER — LINACLOTIDE 145 MCG PO CAPS: 145.0000 ug | ORAL_CAPSULE | Freq: Every day | ORAL | 3 refills | Status: DC

## 2018-10-14 MED ORDER — LISINOPRIL 5 MG PO TABS: 5.0000 mg | ORAL_TABLET | Freq: Every day | ORAL | 1 refills | Status: DC

## 2018-10-14 MED ORDER — PREGABALIN 200 MG PO CAPS: 200.0000 mg | ORAL_CAPSULE | Freq: Two times a day (BID) | ORAL | 5 refills | Status: DC

## 2018-10-14 MED ORDER — PANTOPRAZOLE SODIUM 40 MG PO TBEC: 40.0000 mg | DELAYED_RELEASE_TABLET | Freq: Every day | ORAL | 5 refills | Status: DC

## 2018-10-14 MED ORDER — METOPROLOL TARTRATE 25 MG PO TABS: 25.0000 mg | ORAL_TABLET | Freq: Two times a day (BID) | ORAL | 1 refills | Status: DC

## 2018-10-14 MED ORDER — TRAZODONE HCL 50 MG PO TABS: 25.0000 mg | ORAL_TABLET | Freq: Every evening | ORAL | 3 refills | Status: DC | PRN

## 2018-10-14 MED ORDER — AMITRIPTYLINE HCL 25 MG PO TABS: ORAL_TABLET | ORAL | 1 refills | Status: DC

## 2018-10-14 MED ORDER — ASPIRIN 81 MG PO TBEC: 81.0000 mg | DELAYED_RELEASE_TABLET | Freq: Every day | ORAL | 3 refills | Status: DC

## 2018-10-14 NOTE — Progress Notes (Signed)
BP 118/82 (BP Location: Left Arm, Patient Position: Sitting, Cuff Size: Large)   Pulse 100   Temp (!) 97.5 F (36.4 C)   Ht 5\' 9"  (1.753 m)   Wt 224 lb 2 oz (101.7 kg)   SpO2 98%   BMI 33.10 kg/m    Subjective:    Patient ID: Susan Macias, female    DOB: 06-22-1971, 47 y.o.   MRN: [id number]  HPI: Susan Macias is a 46 y.o. female  Chief Complaint  Patient presents with  . Numbness    patient states that she does not have feeling in her toes  . Strength    patient has losy strenght in her wrist  . Back Pain  . Constipation    ? acid reflux, ? omeprazole with Plavix  . Insomnia   HYPERTENSION / HYPERLIPIDEMIA Satisfied with current treatment? yes Duration of hypertension: chronic BP monitoring frequency: not checking BP medication side effects: no Past BP meds: lisinopril, metoprolol Duration of hyperlipidemia: chronic Cholesterol medication side effects: no Cholesterol supplements: none Past cholesterol medications: atorvastatin Medication compliance: good compliance Aspirin: yes Recent stressors: no Recurrent headaches: no Visual changes: no Palpitations: no Dyspnea: no Chest pain: no Lower extremity edema: no Dizzy/lightheaded: no  Having weakness in her arms. Has known arthritis in her neck. Seeing the [facility]osis [facility]. Has not done any PT. She saw them and had a shot  ABDOMINAL ISSUES- Not having trouble swallowing. Having issues with constipation.  Duration: 3 months Nature: bloating and cramps Location: diffuse  Severity: moderate  Radiation: no Frequency: constant Treatments attempted: PPI and laxatives Constipation: yes Diarrhea: no Episodes of diarrhea/day: Mucous in the stool: no Heartburn: yes Bloating:yes Flatulence: yes Nausea: yes Vomiting: no Melena or hematochezia: no Rash: no Jaundice: no Fever: no Weight loss: no  FIBROMYALGIA Pain status: stable Satisfied with current treatment?: no Medication side effects:  no Medication compliance: good compliance Duration: chronic Location: widespread Quality: aching and sore Current pain level: moderate Previous pain level: moderate Aggravating factors: moving, activity Alleviating factors: medicine Previous pain specialty evaluation: yes Non-narcotic analgesic meds: yes Narcotic contract:no Treatments attempted:lyrica    INSOMNIA- not sleeping most of the night due to her neck hurting, sleeping during the day Duration: about a month or 2 Satisfied with sleep quality: no Difficulty falling asleep: yes Difficulty staying asleep: yes Waking a few hours after sleep onset: yes Early morning awakenings: yes Daytime hypersomnolence: yes Wakes feeling refreshed: no Good sleep hygiene: no Apnea: no Snoring: no Depressed/anxious mood: yes Recent stress: yes Restless legs/nocturnal leg cramps: yes Chronic pain/arthritis: yes History of sleep study: no Treatments attempted: none   Depression screen [facility] 2/9 10/14/2018 11/20/2017 08/25/2017 07/30/2017 07/21/2017  Decreased Interest 3 1 3  (No Data) 3  Down, Depressed, Hopeless 0 1 1 - 1  PHQ - 2 Score 3 2 4  - 4  Altered sleeping 3 2 3  - 3  Tired, decreased energy 3 3 3  - 3  Change in appetite 0 3 3 - 0  Feeling bad or failure about yourself  0 0 2 - 1  Trouble concentrating 3 3 3  - 3  Moving slowly or fidgety/restless 0 1 3 - 3  Suicidal thoughts 0 0 0 - 0  PHQ-9 Score 12 14 21  - 17  Difficult doing work/chores Extremely dIfficult Somewhat difficult Somewhat difficult - Extremely dIfficult    Relevant past medical, surgical, family and social history reviewed and updated as indicated. Interim medical history since our last visit  reviewed. Allergies and medications reviewed and updated.  Review of Systems  Constitutional: Negative.   HENT: Negative.   Respiratory: Negative.   Cardiovascular: Negative.   Gastrointestinal: Positive for abdominal distention, abdominal pain and constipation.  Negative for anal bleeding, blood in stool, diarrhea, nausea, rectal pain and vomiting.  Skin: Negative.   Neurological: Positive for weakness and numbness. Negative for dizziness, tremors, seizures, syncope, facial asymmetry, speech difficulty, light-headedness and headaches.  Psychiatric/Behavioral: Negative.     Per HPI unless specifically indicated above     Objective:    BP 118/82 (BP Location: Left Arm, Patient Position: Sitting, Cuff Size: Large)   Pulse 100   Temp (!) 97.5 F (36.4 C)   Ht 5\' 9"  (1.753 m)   Wt 224 lb 2 oz (101.7 kg)   SpO2 98%   BMI 33.10 kg/m   Wt Readings from Last 3 Encounters:  10/14/18 224 lb 2 oz (101.7 kg)  07/29/18 195 lb (88.5 kg)  04/15/18 189 lb 11.2 oz (86 kg)    Physical Exam  Constitutional: She is oriented to person, place, and time. She appears well-developed and well-nourished. No distress.  HENT:  Head: Normocephalic and atraumatic.  Right Ear: Hearing normal.  Left Ear: Hearing normal.  Nose: Nose normal.  Eyes: Conjunctivae and lids are normal. Right eye exhibits no discharge. Left eye exhibits no discharge. No scleral icterus.  Cardiovascular: Normal rate, regular rhythm, normal heart sounds and intact distal pulses. Exam reveals no gallop and no friction rub.  No murmur heard. Pulmonary/Chest: Effort normal and breath sounds normal. No stridor. No respiratory distress. She has no wheezes. She has no rales. She exhibits no tenderness.  Musculoskeletal: Normal range of motion.  Neurological: She is alert and oriented to person, place, and time.  Skin: Skin is warm, dry and intact. Capillary refill takes less than 2 seconds. No rash noted. She is not diaphoretic. No erythema. No pallor.  Psychiatric: She has a normal mood and affect. Her speech is normal and behavior is normal. Judgment and thought content normal. Cognition and memory are normal.  Nursing note and vitals reviewed.   Results for orders placed or performed in visit  on 10/14/18  Microalbumin, Urine Waived  Result Value Ref Range   Microalb, Ur Waived 30 (H) 0 - 19 mg/L   Creatinine, Urine Waived 200 10 - 300 mg/dL   Microalb/Creat Ratio <30 <30 mg/g  UA/M w/rflx Culture, Routine  Result Value Ref Range   Specific Gravity, UA 1.020 1.005 - 1.030   pH, UA 7.0 5.0 - 7.5   Color, UA Orange Yellow   Appearance Ur Cloudy (A) Clear   Leukocytes, UA Negative Negative   Protein, UA Negative Negative/Trace   Glucose, UA Negative Negative   Ketones, UA Negative Negative   RBC, UA Negative Negative   Bilirubin, UA Negative Negative   Urobilinogen, Ur 2.0 (H) 0.2 - 1.0 mg/dL   Nitrite, UA Negative Negative      Assessment & Plan:   Problem List Items Addressed This Visit      Cardiovascular and Mediastinum   Essential hypertension    Under good control on current regimen. Continue current regimen. Continue to monitor. Call with any concerns. Refills given. Labs to be drawn today.       Relevant Medications   aspirin 81 MG EC tablet   atorvastatin (LIPITOR) 80 MG tablet   lisinopril (PRINIVIL,ZESTRIL) 5 MG tablet   metoprolol tartrate (LOPRESSOR) 25 MG tablet   Other  Relevant Orders   CBC with Differential/Platelet   Comprehensive metabolic panel   Microalbumin, Urine Waived (Completed)   TSH   UA/M w/rflx Culture, Routine (Completed)     Digestive   GERD (gastroesophageal reflux disease)    Doing OK, but very constipated. Will try to get her constipation under better control and continue to monitor.       Relevant Medications   pantoprazole (PROTONIX) 40 MG tablet   linaclotide (LINZESS) 145 MCG CAPS capsule   Other Relevant Orders   CBC with Differential/Platelet   Comprehensive metabolic panel   TSH   UA/M w/rflx Culture, Routine (Completed)   Chronic constipation    Not doing well. Will start her on linzess and recheck 1 month. Call with any concerns. If not getting better, will get her into GI.        Endocrine   IFG  (impaired fasting glucose)    Checking labs today. Await results.       Relevant Orders   CBC with Differential/Platelet   Comprehensive metabolic panel   Microalbumin, Urine Waived (Completed)   TSH   UA/M w/rflx Culture, Routine (Completed)   Hgb A1c w/o eAG     Other   Fibromyalgia (Chronic)    Stable on lyrica. Continue lyrica. Continue to monitor. Call with any concerns.       Relevant Orders   CBC with Differential/Platelet   Comprehensive metabolic panel   TSH   UA/M w/rflx Culture, Routine (Completed)   Vitamin D deficiency    Rechecking labs today. Await results. Call with any concerns.       Relevant Orders   CBC with Differential/Platelet   Comprehensive metabolic panel   TSH   UA/M w/rflx Culture, Routine (Completed)   Depression    Under good control on current regimen. Continue current regimen. Continue to monitor. Call with any concerns. Refills given. Checking labs today.        Relevant Medications   amitriptyline (ELAVIL) 25 MG tablet   traZODone (DESYREL) 50 MG tablet   Other Relevant Orders   CBC with Differential/Platelet   Comprehensive metabolic panel   TSH   UA/M w/rflx Culture, Routine (Completed)   Insomnia    Will start trazodone. Call with any concerns. Continue to monitor.       Mixed hyperlipidemia    Under good control on current regimen. Continue current regimen. Continue to monitor. Call with any concerns. Refills given. Checking labs today.       Relevant Medications   aspirin 81 MG EC tablet   atorvastatin (LIPITOR) 80 MG tablet   lisinopril (PRINIVIL,ZESTRIL) 5 MG tablet   metoprolol tartrate (LOPRESSOR) 25 MG tablet   Other Relevant Orders   Lipid Panel w/o Chol/HDL Ratio    Other Visit Diagnoses    Paresthesias    -  Primary   Likely due to her cervical radiculopathy. Advised her to follow up with scoliosis and spine. Call with any concerns.    Relevant Orders   CBC with Differential/Platelet   Comprehensive  metabolic panel   Microalbumin, Urine Waived (Completed)   TSH   UA/M w/rflx Culture, Routine (Completed)   B12 and Folate Panel   Hgb A1c w/o eAG       Follow up plan: Return in about 4 weeks (around 11/11/2018) for Follow up.

## 2018-10-16 ENCOUNTER — Encounter: Payer: Self-pay | Admitting: Family Medicine

## 2018-10-16 DIAGNOSIS — K5909 Other constipation: Secondary | ICD-10-CM | POA: Insufficient documentation

## 2018-10-16 NOTE — Assessment & Plan Note (Signed)
Under good control on current regimen. Continue current regimen. Continue to monitor. Call with any concerns. Refills given. Checking labs today.  

## 2018-10-16 NOTE — Assessment & Plan Note (Signed)
Rechecking labs today. Await results. Call with any concerns.  

## 2018-10-16 NOTE — Assessment & Plan Note (Signed)
Not doing well. Will start her on linzess and recheck 1 month. Call with any concerns. If not getting better, will get her into GI.

## 2018-10-16 NOTE — Assessment & Plan Note (Signed)
Stable on lyrica. Continue lyrica. Continue to monitor. Call with any concerns.

## 2018-10-16 NOTE — Assessment & Plan Note (Signed)
Will start trazodone. Call with any concerns. Continue to monitor.  

## 2018-10-16 NOTE — Assessment & Plan Note (Signed)
Under good control on current regimen. Continue current regimen. Continue to monitor. Call with any concerns. Refills given. Labs to be drawn today.  

## 2018-10-16 NOTE — Assessment & Plan Note (Signed)
Doing OK, but very constipated. Will try to get her constipation under better control and continue to monitor.

## 2018-10-16 NOTE — Assessment & Plan Note (Signed)
Checking labs today. Await results.  

## 2018-10-21 DIAGNOSIS — M545 Low back pain: Secondary | ICD-10-CM | POA: Diagnosis not present

## 2018-10-21 DIAGNOSIS — M5106 Intervertebral disc disorders with myelopathy, lumbar region: Secondary | ICD-10-CM | POA: Diagnosis not present

## 2018-10-21 DIAGNOSIS — G894 Chronic pain syndrome: Secondary | ICD-10-CM | POA: Diagnosis not present

## 2018-10-21 DIAGNOSIS — M7918 Myalgia, other site: Secondary | ICD-10-CM | POA: Diagnosis not present

## 2018-11-04 ENCOUNTER — Encounter: Payer: Self-pay | Admitting: Family Medicine

## 2018-11-04 ENCOUNTER — Telehealth: Payer: Self-pay | Admitting: Family Medicine

## 2018-11-04 NOTE — Telephone Encounter (Signed)
Attempted to reach patient, unavailable at this time. Will try to call again later.

## 2018-11-04 NOTE — Telephone Encounter (Signed)
Has not come back in for her labs. Can we please remind her she needs to come in and check to see if she would like to have them drawn here or at the hospital?

## 2018-11-05 ENCOUNTER — Other Ambulatory Visit: Payer: Self-pay | Admitting: Family Medicine

## 2018-11-05 DIAGNOSIS — I10 Essential (primary) hypertension: Secondary | ICD-10-CM

## 2018-11-05 DIAGNOSIS — R202 Paresthesia of skin: Secondary | ICD-10-CM

## 2018-11-05 DIAGNOSIS — E782 Mixed hyperlipidemia: Secondary | ICD-10-CM

## 2018-11-05 DIAGNOSIS — K219 Gastro-esophageal reflux disease without esophagitis: Secondary | ICD-10-CM

## 2018-11-05 NOTE — Telephone Encounter (Signed)
Attempted to call patient. Line would not ring, unsure if related to day's outage. Will return call later.

## 2018-11-06 NOTE — Telephone Encounter (Signed)
Tried to call patient, phone would not ring. Will try again.

## 2018-11-09 NOTE — Telephone Encounter (Signed)
Letter generated and mailed requesting patient call us back/come get labs.

## 2018-11-09 NOTE — Telephone Encounter (Signed)
Attempted to reach patient. "The phone number you have dial is temporarily not in service"

## 2018-11-13 ENCOUNTER — Other Ambulatory Visit: Payer: Self-pay | Admitting: Family Medicine

## 2018-11-13 ENCOUNTER — Ambulatory Visit: Payer: Medicare Other | Admitting: Family Medicine

## 2018-11-16 ENCOUNTER — Other Ambulatory Visit: Payer: Medicare Other

## 2018-11-16 ENCOUNTER — Ambulatory Visit: Payer: Medicare Other | Admitting: Family Medicine

## 2018-11-23 ENCOUNTER — Ambulatory Visit: Payer: Self-pay

## 2018-12-07 ENCOUNTER — Ambulatory Visit: Payer: Medicare Other | Admitting: Family Medicine

## 2018-12-07 ENCOUNTER — Other Ambulatory Visit: Payer: Medicare Other

## 2018-12-16 ENCOUNTER — Other Ambulatory Visit: Payer: Medicare Other

## 2018-12-16 ENCOUNTER — Ambulatory Visit: Payer: Medicare Other | Admitting: Family Medicine

## 2018-12-17 ENCOUNTER — Ambulatory Visit (INDEPENDENT_AMBULATORY_CARE_PROVIDER_SITE_OTHER): Payer: Medicare Other | Admitting: Family Medicine

## 2018-12-17 ENCOUNTER — Encounter: Payer: Self-pay | Admitting: Family Medicine

## 2018-12-17 VITALS — BP 131/88 | HR 75 | Temp 97.6°F | Ht 69.0 in | Wt 231.2 lb

## 2018-12-17 DIAGNOSIS — M47812 Spondylosis without myelopathy or radiculopathy, cervical region: Secondary | ICD-10-CM

## 2018-12-17 DIAGNOSIS — R7301 Impaired fasting glucose: Secondary | ICD-10-CM

## 2018-12-17 DIAGNOSIS — M47816 Spondylosis without myelopathy or radiculopathy, lumbar region: Secondary | ICD-10-CM

## 2018-12-17 DIAGNOSIS — E782 Mixed hyperlipidemia: Secondary | ICD-10-CM

## 2018-12-17 DIAGNOSIS — I10 Essential (primary) hypertension: Secondary | ICD-10-CM

## 2018-12-17 DIAGNOSIS — K5909 Other constipation: Secondary | ICD-10-CM

## 2018-12-17 DIAGNOSIS — K219 Gastro-esophageal reflux disease without esophagitis: Secondary | ICD-10-CM | POA: Diagnosis not present

## 2018-12-17 DIAGNOSIS — R202 Paresthesia of skin: Secondary | ICD-10-CM | POA: Diagnosis not present

## 2018-12-17 NOTE — Progress Notes (Signed)
BP 131/88 (BP Location: Left Arm, Patient Position: Sitting, Cuff Size: Normal)   Pulse 75   Temp 97.6 F (36.4 C) (Oral)   Ht 5\' 9"  (1.753 m)   Wt 231 lb 3.2 oz (104.9 kg)   SpO2 99%   BMI 34.14 kg/m    Subjective:    Patient ID: Susan Macias, female    DOB: [birth year], 48 y.o.   MRN: [id number]  HPI: Susan Macias is a 48 y.o. female  Chief Complaint  Patient presents with  . Hyperlipidemia    1 month F/U  . Hypertension  . Numbness  . Gastroesophageal Reflux  . Fibromyalgia    Patient states she's having a flare up around her neck and shoulders.    Did not get any of her labs ordered last visit drawn last visit.   Saw her spine doctor about a month ago. States that she is having a "flare" in her neck and shoulders. Unsure if it's fibro or if it's from her arthritis. She notes that when she saw her neck doctor they said she had arthritis. She doesn't remember what they told her to do about it and notes are not available at this time.   HYPERTENSION / HYPERLIPIDEMIA Satisfied with current treatment? yes Duration of hypertension: chronic BP monitoring frequency: not checking BP medication side effects: no Past BP meds: lisinopril, metoprolol Duration of hyperlipidemia: chronic Cholesterol medication side effects: no Cholesterol supplements: none Past cholesterol medications: atorvastatin Medication compliance: good compliance Aspirin: yes Recent stressors: no Recurrent headaches: yes Visual changes: no Palpitations: no Dyspnea: no Chest pain: no Lower extremity edema: no Dizzy/lightheaded: no  Linzess has been helping her with her constipation. She is feeling much better.     Relevant past medical, surgical, family and social history reviewed and updated as indicated. Interim medical history since our last visit reviewed. Allergies and medications reviewed and updated.  Review of Systems  Constitutional: Negative.   Respiratory: Negative.     Cardiovascular: Negative.   Musculoskeletal: Positive for arthralgias, myalgias, neck pain and neck stiffness. Negative for back pain, gait problem and joint swelling.  Skin: Negative.   Psychiatric/Behavioral: Negative.     Per HPI unless specifically indicated above     Objective:    BP 131/88 (BP Location: Left Arm, Patient Position: Sitting, Cuff Size: Normal)   Pulse 75   Temp 97.6 F (36.4 C) (Oral)   Ht 5\' 9"  (1.753 m)   Wt 231 lb 3.2 oz (104.9 kg)   SpO2 99%   BMI 34.14 kg/m   Wt Readings from Last 3 Encounters:  12/17/18 231 lb 3.2 oz (104.9 kg)  10/14/18 224 lb 2 oz (101.7 kg)  07/29/18 195 lb (88.5 kg)    Physical Exam Vitals signs and nursing note reviewed.  Constitutional:      General: She is not in acute distress.    Appearance: Normal appearance. She is not ill-appearing, toxic-appearing or diaphoretic.  HENT:     Head: Normocephalic and atraumatic.     Right Ear: External ear normal.     Left Ear: External ear normal.     Nose: Nose normal.     Mouth/Throat:     Mouth: Mucous membranes are moist.     Pharynx: Oropharynx is clear.  Eyes:     General: No scleral icterus.       Right eye: No discharge.        Left eye: No discharge.     Extraocular  Movements: Extraocular movements intact.     Conjunctiva/sclera: Conjunctivae normal.     Pupils: Pupils are equal, round, and reactive to light.  Neck:     Musculoskeletal: Normal range of motion and neck supple.  Cardiovascular:     Rate and Rhythm: Normal rate and regular rhythm.     Pulses: Normal pulses.     Heart sounds: Normal heart sounds. No murmur. No friction rub. No gallop.   Pulmonary:     Effort: Pulmonary effort is normal. No respiratory distress.     Breath sounds: Normal breath sounds. No stridor. No wheezing, rhonchi or rales.  Chest:     Chest wall: No tenderness.  Musculoskeletal: Normal range of motion.  Skin:    General: Skin is warm and dry.     Capillary Refill: Capillary  refill takes less than 2 seconds.     Coloration: Skin is not jaundiced or pale.     Findings: No bruising, erythema, lesion or rash.  Neurological:     General: No focal deficit present.     Mental Status: She is alert and oriented to person, place, and time. Mental status is at baseline.  Psychiatric:        Mood and Affect: Mood normal.        Behavior: Behavior normal.        Thought Content: Thought content normal.        Judgment: Judgment normal.     Results for orders placed or performed in visit on 10/14/18  Microalbumin, Urine Waived  Result Value Ref Range   Microalb, Ur Waived 30 (H) 0 - 19 mg/L   Creatinine, Urine Waived 200 10 - 300 mg/dL   Microalb/Creat Ratio <30 <30 mg/g  UA/M w/rflx Culture, Routine  Result Value Ref Range   Specific Gravity, UA 1.020 1.005 - 1.030   pH, UA 7.0 5.0 - 7.5   Color, UA Orange Yellow   Appearance Ur Cloudy (A) Clear   Leukocytes, UA Negative Negative   Protein, UA Negative Negative/Trace   Glucose, UA Negative Negative   Ketones, UA Negative Negative   RBC, UA Negative Negative   Bilirubin, UA Negative Negative   Urobilinogen, Ur 2.0 (H) 0.2 - 1.0 mg/dL   Nitrite, UA Negative Negative      Assessment & Plan:   Problem List Items Addressed This Visit      Cardiovascular and Mediastinum   Essential hypertension - Primary    Under good control on current regimen. Continue current regimen. Continue to monitor. Call with any concerns. Refills given. Labs drawn today.         Digestive   GERD (gastroesophageal reflux disease)    Under good control on current regimen. Continue current regimen. Continue to monitor. Call with any concerns. Refills given. Labs drawn today.       Chronic constipation    Doing much better with the linzess. Continue linzess. Continue to monitor.         Endocrine   IFG (impaired fasting glucose)    Did not get labs drawn last visit. Will draw them today. Call with any concerns.          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 Advised follow up with them. Will get her back into see the pain clinic. Referral generated today.      Relevant Orders   Ambulatory referral to Pain  Clinic     Other   Mixed hyperlipidemia    Rechecking levels today. Continue current regimen. Continue to monitor. Call with any concerns. Refills given. Labs drawn today.        Other Visit Diagnoses    Paresthesias       Checking labs today. Await results. Call with any concerns.        Follow up plan: Return in about 6 months (around 06/17/2019) for physical.

## 2018-12-17 NOTE — Assessment & Plan Note (Signed)
Under good control on current regimen. Continue current regimen. Continue to monitor. Call with any concerns. Refills given. Labs drawn today.   

## 2018-12-17 NOTE — Assessment & Plan Note (Signed)
Rechecking levels today. Continue current regimen. Continue to monitor. Call with any concerns. Refills given. Labs drawn today.

## 2018-12-17 NOTE — Assessment & Plan Note (Signed)
Doing much better with the linzess. Continue linzess. Continue to monitor.

## 2018-12-17 NOTE — Assessment & Plan Note (Signed)
Will obtain notes from spine doctor. Advised follow up with them. Will get her back into see the pain clinic. Referral generated today.

## 2018-12-17 NOTE — Assessment & Plan Note (Signed)
Did not get labs drawn last visit. Will draw them today. Call with any concerns.

## 2018-12-18 LAB — CBC WITH DIFFERENTIAL/PLATELET
BASOS: 0 %
Basophils Absolute: 0 10*3/uL (ref 0.0–0.2)
EOS (ABSOLUTE): 0.1 10*3/uL (ref 0.0–0.4)
Eos: 1 %
HEMOGLOBIN: 13.1 g/dL (ref 11.1–15.9)
Hematocrit: 39.9 % (ref 34.0–46.6)
IMMATURE GRANULOCYTES: 0 %
Immature Grans (Abs): 0 10*3/uL (ref 0.0–0.1)
LYMPHS ABS: 2.5 10*3/uL (ref 0.7–3.1)
Lymphs: 38 %
MCH: 30 pg (ref 26.6–33.0)
MCHC: 32.8 g/dL (ref 31.5–35.7)
MCV: 91 fL (ref 79–97)
MONOCYTES: 7 %
MONOS ABS: 0.5 10*3/uL (ref 0.1–0.9)
Neutrophils Absolute: 3.5 10*3/uL (ref 1.4–7.0)
Neutrophils: 54 %
Platelets: 254 10*3/uL (ref 150–450)
RBC: 4.37 x10E6/uL (ref 3.77–5.28)
RDW: 14.5 % (ref 11.7–15.4)
WBC: 6.5 10*3/uL (ref 3.4–10.8)

## 2018-12-18 LAB — COMPREHENSIVE METABOLIC PANEL
ALBUMIN: 4.1 g/dL (ref 3.5–5.5)
ALK PHOS: 125 IU/L — AB (ref 39–117)
ALT: 37 IU/L — ABNORMAL HIGH (ref 0–32)
AST: 24 IU/L (ref 0–40)
Albumin/Globulin Ratio: 1.4 (ref 1.2–2.2)
BUN / CREAT RATIO: 14 (ref 9–23)
BUN: 14 mg/dL (ref 6–24)
Bilirubin Total: 0.3 mg/dL (ref 0.0–1.2)
CO2: 23 mmol/L (ref 20–29)
Calcium: 8.9 mg/dL (ref 8.7–10.2)
Chloride: 100 mmol/L (ref 96–106)
Creatinine, Ser: 0.97 mg/dL (ref 0.57–1.00)
GFR calc Af Amer: 81 mL/min/{1.73_m2} (ref >59)
GFR, EST NON AFRICAN AMERICAN: 70 mL/min/{1.73_m2} (ref >59)
GLOBULIN, TOTAL: 2.9 g/dL (ref 1.5–4.5)
Glucose: 95 mg/dL (ref 65–99)
Potassium: 4.6 mmol/L (ref 3.5–5.2)
SODIUM: 139 mmol/L (ref 134–144)
Total Protein: 7 g/dL (ref 6.0–8.5)

## 2018-12-18 LAB — LIPID PANEL W/O CHOL/HDL RATIO
CHOLESTEROL TOTAL: 138 mg/dL (ref 100–199)
HDL: 59 mg/dL (ref >39)
LDL Calculated: 51 mg/dL (ref 0–99)
Triglycerides: 142 mg/dL (ref 0–149)
VLDL CHOLESTEROL CAL: 28 mg/dL (ref 5–40)

## 2018-12-18 LAB — TSH: TSH: 3.2 u[IU]/mL (ref 0.450–4.500)

## 2018-12-21 ENCOUNTER — Encounter: Payer: Self-pay | Admitting: Family Medicine

## 2018-12-23 ENCOUNTER — Other Ambulatory Visit: Payer: Self-pay

## 2018-12-23 ENCOUNTER — Encounter: Payer: Self-pay | Admitting: *Deleted

## 2018-12-23 ENCOUNTER — Emergency Department
Admission: EM | Admit: 2018-12-23 | Discharge: 2018-12-23 | Disposition: A | Discharge status: Home / Self Care | Payer: Medicare Other | Attending: Emergency Medicine | Admitting: Emergency Medicine

## 2018-12-23 DIAGNOSIS — Z7902 Long term (current) use of antithrombotics/antiplatelets: Secondary | ICD-10-CM | POA: Insufficient documentation

## 2018-12-23 DIAGNOSIS — Z87891 Personal history of nicotine dependence: Secondary | ICD-10-CM | POA: Diagnosis not present

## 2018-12-23 DIAGNOSIS — Z79899 Other long term (current) drug therapy: Secondary | ICD-10-CM | POA: Insufficient documentation

## 2018-12-23 DIAGNOSIS — Z7982 Long term (current) use of aspirin: Secondary | ICD-10-CM | POA: Diagnosis not present

## 2018-12-23 DIAGNOSIS — I10 Essential (primary) hypertension: Secondary | ICD-10-CM | POA: Diagnosis not present

## 2018-12-23 DIAGNOSIS — I2510 Atherosclerotic heart disease of native coronary artery without angina pectoris: Secondary | ICD-10-CM | POA: Diagnosis not present

## 2018-12-23 DIAGNOSIS — M797 Fibromyalgia: Secondary | ICD-10-CM | POA: Diagnosis not present

## 2018-12-23 DIAGNOSIS — M542 Cervicalgia: Secondary | ICD-10-CM | POA: Diagnosis not present

## 2018-12-23 MED ORDER — ORPHENADRINE CITRATE 30 MG/ML IJ SOLN
60.0000 mg | Freq: Two times a day (BID) | INTRAMUSCULAR | Status: DC
  Administered 2018-12-23: 60 mg via INTRAMUSCULAR
  Filled 2018-12-23: qty 2

## 2018-12-23 MED ORDER — METHOCARBAMOL 500 MG PO TABS: 500.0000 mg | ORAL_TABLET | Freq: Three times a day (TID) | ORAL | 0 refills | Status: AC | PRN

## 2018-12-23 MED ORDER — MELOXICAM 15 MG PO TABS: 15.0000 mg | ORAL_TABLET | Freq: Every day | ORAL | 0 refills | Status: AC

## 2018-12-23 MED ORDER — KETOROLAC TROMETHAMINE 30 MG/ML IJ SOLN
30.0000 mg | Freq: Once | INTRAMUSCULAR | Status: AC
  Administered 2018-12-23: 30 mg via INTRAMUSCULAR
  Filled 2018-12-23: qty 1
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
## 2018-12-24 ENCOUNTER — Telehealth: Payer: Self-pay | Admitting: Family Medicine
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
## 2019-01-02 ENCOUNTER — Other Ambulatory Visit: Payer: Self-pay | Admitting: Family Medicine

## 2019-01-06 ENCOUNTER — Ambulatory Visit: Payer: Self-pay

## 2019-01-25 ENCOUNTER — Ambulatory Visit: Payer: Medicare Other | Admitting: Nurse Practitioner

## 2019-01-29 ENCOUNTER — Encounter: Payer: Self-pay | Admitting: Family Medicine

## 2019-01-29 ENCOUNTER — Ambulatory Visit (INDEPENDENT_AMBULATORY_CARE_PROVIDER_SITE_OTHER): Payer: Medicare Other | Admitting: Family Medicine

## 2019-01-29 ENCOUNTER — Other Ambulatory Visit: Payer: Self-pay

## 2019-01-29 VITALS — BP 127/91 | HR 102 | Temp 97.7°F | Ht 69.0 in | Wt 222.0 lb

## 2019-01-29 DIAGNOSIS — R202 Paresthesia of skin: Secondary | ICD-10-CM

## 2019-01-29 DIAGNOSIS — F430 Acute stress reaction: Secondary | ICD-10-CM

## 2019-01-29 MED ORDER — VALACYCLOVIR HCL 1 G PO TABS: 1000.0000 mg | ORAL_TABLET | Freq: Two times a day (BID) | ORAL | 3 refills | Status: DC
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
## 2019-01-31 ENCOUNTER — Encounter: Payer: Self-pay | Admitting: Family Medicine
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
## 2019-02-08 ENCOUNTER — Ambulatory Visit: Payer: Medicare Other | Admitting: Nurse Practitioner

## 2019-02-10 ENCOUNTER — Ambulatory Visit: Payer: Self-pay

## 2019-02-11 ENCOUNTER — Ambulatory Visit: Payer: Medicare Other | Admitting: Nurse Practitioner

## 2019-02-18 ENCOUNTER — Ambulatory Visit: Payer: Medicare Other | Admitting: Cardiovascular Disease

## 2019-02-18 ENCOUNTER — Ambulatory Visit: Payer: Medicare Other | Admitting: Nurse Practitioner

## 2019-02-21 IMAGING — CR DG CHEST 2V
1 series · 2 of 2 positions shown · non-contrast
Comparison: Portable chest x-ray dated March 24, 2012.

CLINICAL DATA: As chest pressure with left arm numbness associated
with nausea vomiting and hypertension. Duration of symptoms 2 days.
Discontinued smoking 1 week ago.

EXAM:
CHEST  2 VIEW

[Series 1: dg chest 2 view · 0.14mm/px · 2 of 2 slices shown]
[im 1/2]
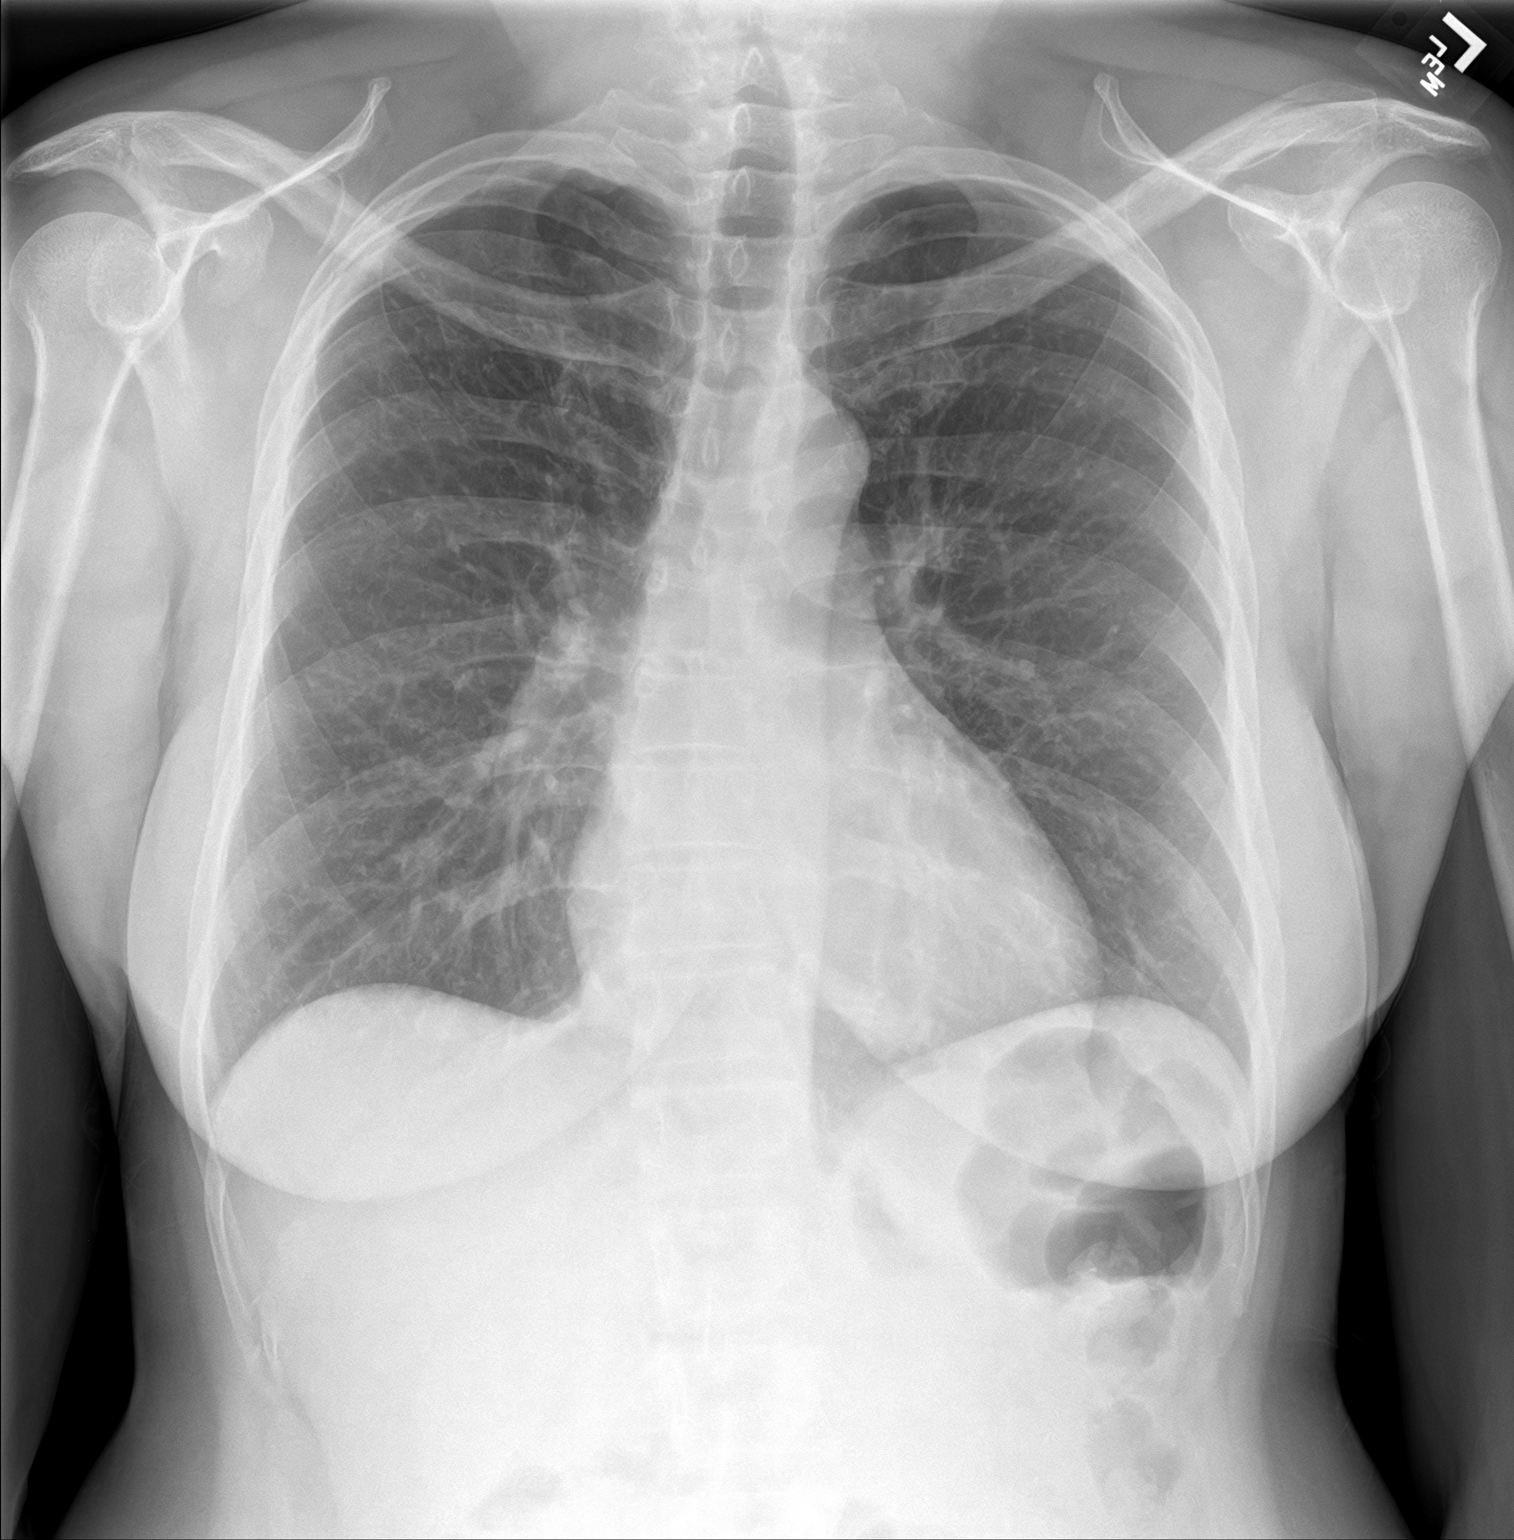
[im 2/2]
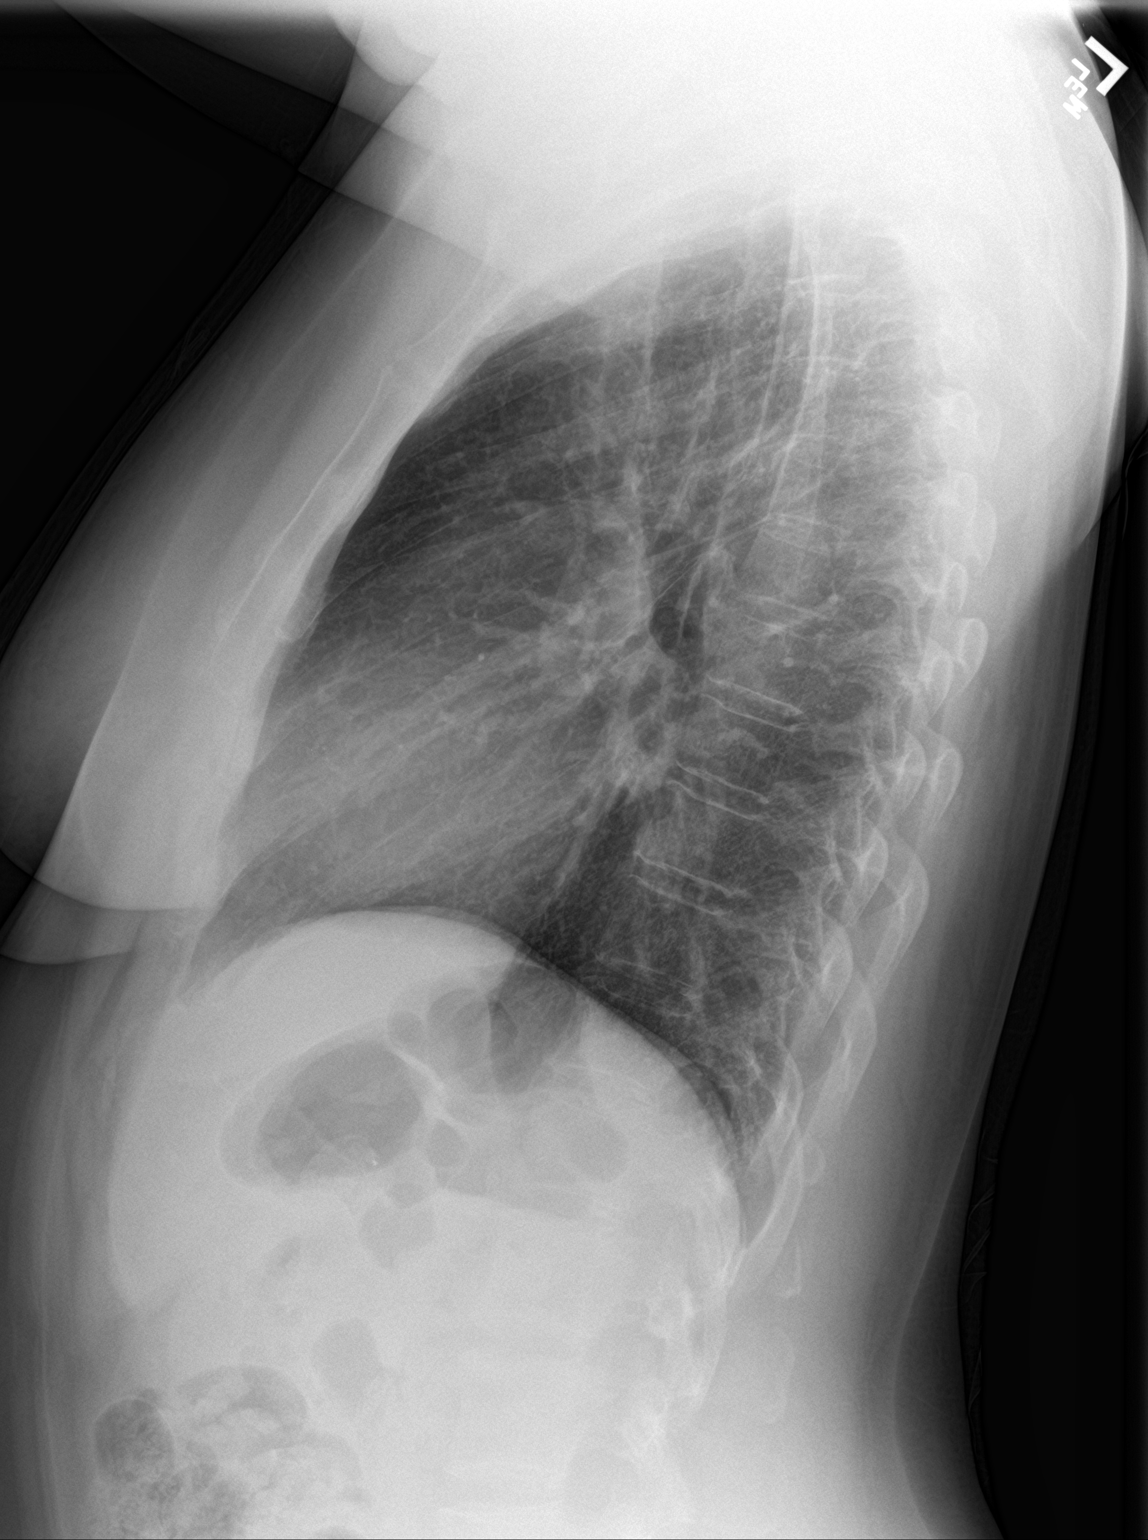

[2 of 2 positions shown; findings below may reference images not displayed]

FINDINGS: The lungs are adequately inflated. The lung markings are coarse
though stable. There is no alveolar infiltrate or pleural effusion.
The heart and pulmonary vascularity are normal. The mediastinum is
normal in width. The bony thorax exhibits no acute abnormality.
IMPRESSION: Coarse interstitial lung markings likely reflect the patient's
smoking history. There is no alveolar pneumonia, CHF, nor other
acute cardiopulmonary abnormality.

## 2019-02-25 ENCOUNTER — Other Ambulatory Visit: Payer: Self-pay | Admitting: Family Medicine

## 2019-02-26 ENCOUNTER — Ambulatory Visit: Payer: Medicare Other | Admitting: Family Medicine

## 2019-02-28 ENCOUNTER — Other Ambulatory Visit: Payer: Self-pay | Admitting: Family Medicine

## 2019-03-04 ENCOUNTER — Telehealth: Payer: Self-pay

## 2019-03-04 NOTE — Telephone Encounter (Signed)
Virtual Visit Pre-Appointment Phone Call  Steps For Call:  1. Confirm consent - "In the setting of the current Covid19 crisis, you are scheduled for a VIDEO visit with your provider on 03/30/2019 at 11:00AM.  Just as we do with many in-office visits, in order for you to participate in this visit, we must obtain consent.  If you'd like, I can send this to your mychart (if signed up) or email for you to review.  Otherwise, I can obtain your verbal consent now.  All virtual visits are billed to your insurance company just like a normal visit would be.  By agreeing to a virtual visit, we'd like you to understand that the technology does not allow for your provider to perform an examination, and thus may limit your provider's ability to fully assess your condition.  Finally, though the technology is pretty good, we cannot assure that it will always work on either your or our end, and in the setting of a video visit, we may have to convert it to a phone-only visit.  In either situation, we cannot ensure that we have a secure connection.  Are you willing to proceed?"  2. Give patient instructions for WebEx download to smartphone as below if video visit  3. Advise patient to be prepared with any vital sign or heart rhythm information, their current medicines, and a piece of paper and pen handy for any instructions they may receive the day of their visit  4. Inform patient they will receive a phone call 15 minutes prior to their appointment time (may be from unknown caller ID) so they should be prepared to answer  5. Confirm that appointment type is correct in Epic appointment notes (video vs telephone)    TELEPHONE CALL NOTE  Susan Macias has been deemed a candidate for a follow-up tele-health visit to limit community exposure during the Covid-19 pandemic. I spoke with the patient via phone to ensure availability of phone/video source, confirm preferred email & phone number, and discuss  instructions and expectations.  I reminded Susan Macias to be prepared with any vital sign and/or heart rhythm information that could potentially be obtained via home monitoring, at the time of her visit. I reminded Susan Macias to expect a phone call at the time of her visit if her visit.  Did the patient verbally acknowledge consent to treatment? YES  Susan Macias, [place] 03/04/2019 11:11 AM  CONSENT FOR TELE-HEALTH VISIT - PLEASE REVIEW  I hereby voluntarily request, consent and authorize [facility] and its employed or contracted physicians, physician assistants, nurse practitioners or other licensed health care professionals (the Practitioner), to provide me with telemedicine health care services (the Services") as deemed necessary by the treating Practitioner. I acknowledge and consent to receive the Services by the Practitioner via telemedicine. I understand that the telemedicine visit will involve communicating with the Practitioner through live audiovisual communication technology and the disclosure of certain medical information by electronic transmission. I acknowledge that I have been given the opportunity to request an in-person assessment or other available alternative prior to the telemedicine visit and am voluntarily participating in the telemedicine visit.  I understand that I have the right to withhold or withdraw my consent to the use of telemedicine in the course of my care at any time, without affecting my right to future care or treatment, and that the Practitioner or I may terminate the telemedicine visit at any time. I understand that I have  the right to inspect all information obtained and/or recorded in the course of the telemedicine visit and may receive copies of available information for a reasonable fee.  I understand that some of the potential risks of receiving the Services via telemedicine include:   Delay or interruption in medical evaluation due to  technological equipment failure or disruption;  Information transmitted may not be sufficient (e.g. poor resolution of images) to allow for appropriate medical decision making by the Practitioner; and/or   In rare instances, security protocols could fail, causing a breach of personal health information.  Furthermore, I acknowledge that it is my responsibility to provide information about my medical history, conditions and care that is complete and accurate to the best of my ability. I acknowledge that Practitioner's advice, recommendations, and/or decision may be based on factors not within their control, such as incomplete or inaccurate data provided by me or distortions of diagnostic images or specimens that may result from electronic transmissions. I understand that the practice of medicine is not an exact science and that Practitioner makes no warranties or guarantees regarding treatment outcomes. I acknowledge that I will receive a copy of this consent concurrently upon execution via email to the email address I last provided but may also request a printed copy by calling the office of [place].    I understand that my insurance will be billed for this visit.   I have read or had this consent read to me.  I understand the contents of this consent, which adequately explains the benefits and risks of the Services being provided via telemedicine.   I have been provided ample opportunity to ask questions regarding this consent and the Services and have had my questions answered to my satisfaction.  I give my informed consent for the services to be provided through the use of telemedicine in my medical care  By participating in this telemedicine visit I agree to the above.

## 2019-03-04 NOTE — Telephone Encounter (Signed)
lmov for patient to call back.  Need to change appointment to an EVSIT

## 2019-03-05 ENCOUNTER — Other Ambulatory Visit: Payer: Self-pay | Admitting: Family Medicine

## 2019-03-08 ENCOUNTER — Telehealth: Payer: Self-pay

## 2019-03-08 NOTE — Telephone Encounter (Signed)
Patient scheduled for an AWV on 03/11/2019 with [facility], Due to Covid-19 pandemic this is unable to be done in office, called patient to see if she was able to do this virtually or if she preferred to be rescheduled for a later date in the office.   Unable to leave message with patient, please have her call me directly at [phone] to reschedule a virtual visit or reschedule for later date.

## 2019-03-11 ENCOUNTER — Ambulatory Visit: Payer: Self-pay

## 2019-03-11 ENCOUNTER — Ambulatory Visit: Payer: Medicare Other | Admitting: Nurse Practitioner

## 2019-03-12 ENCOUNTER — Other Ambulatory Visit: Payer: Self-pay | Admitting: Family Medicine

## 2019-03-12 NOTE — Telephone Encounter (Signed)
Requested Prescriptions  Pending Prescriptions Disp Refills  . nitroGLYCERIN (NITROSTAT) 0.4 MG SL tablet [Pharmacy Med Name: NITROGLYCERIN 0.4 MG TABLET SL] 25 tablet 0    Sig: TAKE 1 TABLET BY MOUTH UNDER TONGUE EVERY 5 MIN AS NEEDED FOR CHEST PAIN     Cardiovascular:  Nitrates Failed - 03/12/2019  2:13 PM      Failed - Last BP in normal range    BP Readings from Last 1 Encounters:  01/29/19 (!) 127/91         Passed - Last Heart Rate in normal range    Pulse Readings from Last 1 Encounters:  01/29/19 (!) 102         Passed - Valid encounter within last 12 months    Recent Outpatient Visits          1 month ago Paresthesias   [facility] [place], [place], DO   2 months ago Essential hypertension   [facility], [place], DO   4 months ago Paresthesias   [facility] [place], [name], DO   11 months ago BV (bacterial vaginosis)   [facility] [place], [name], DO   1 year ago Routine screening for STI (sexually transmitted infection)   [facility] [place], [name], DO      Future Appointments            In 3 days  [facility]   In 1 week Laural Benes, Oralia Rud, DO [facility]

## 2019-03-15 ENCOUNTER — Ambulatory Visit: Payer: Self-pay

## 2019-03-16 IMAGING — CR DG CHEST 2V
2 series · 2 of 2 positions shown · non-contrast
Comparison: 07/03/2017

CLINICAL DATA: Left-sided chest pain.

EXAM:
CHEST  2 VIEW

[chest pa]
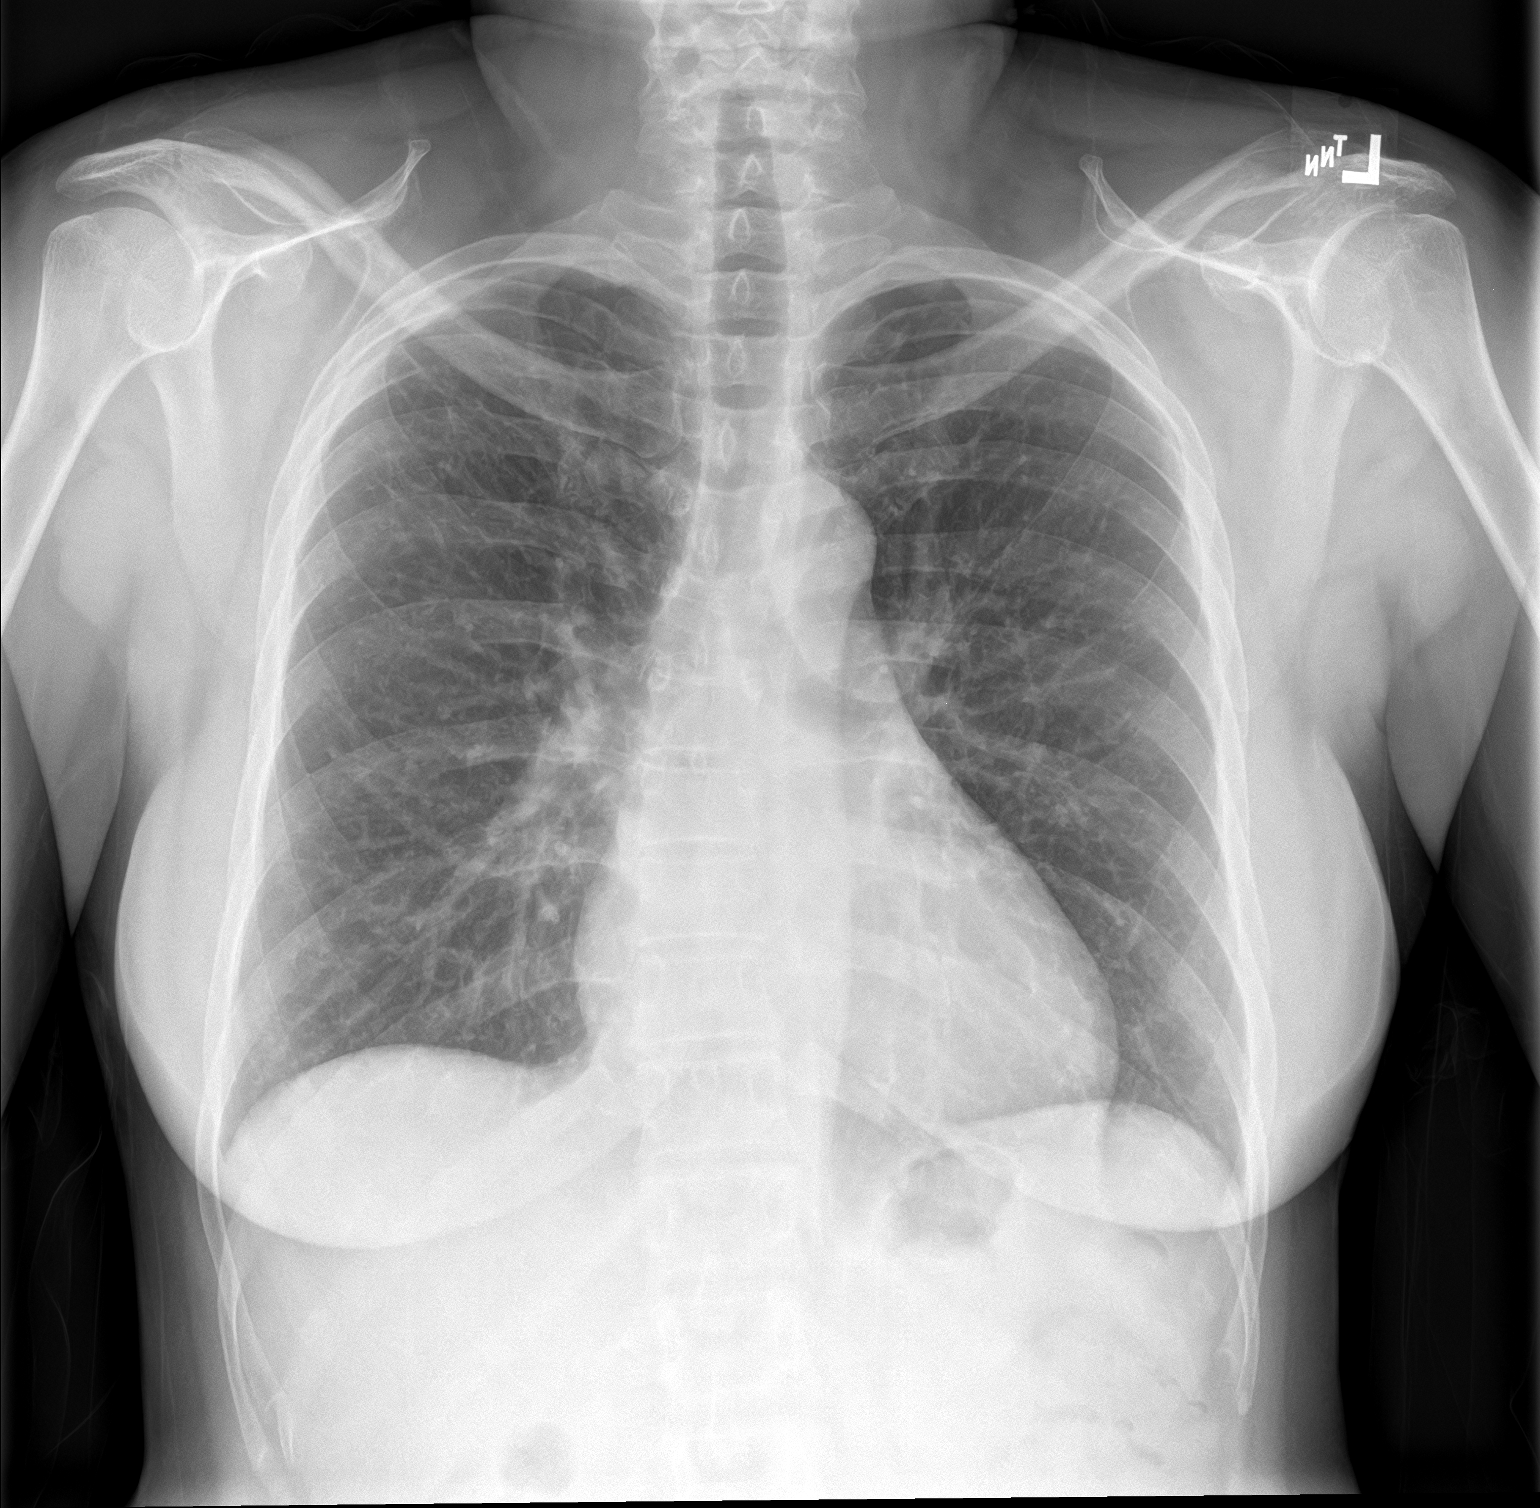

[chest lat]
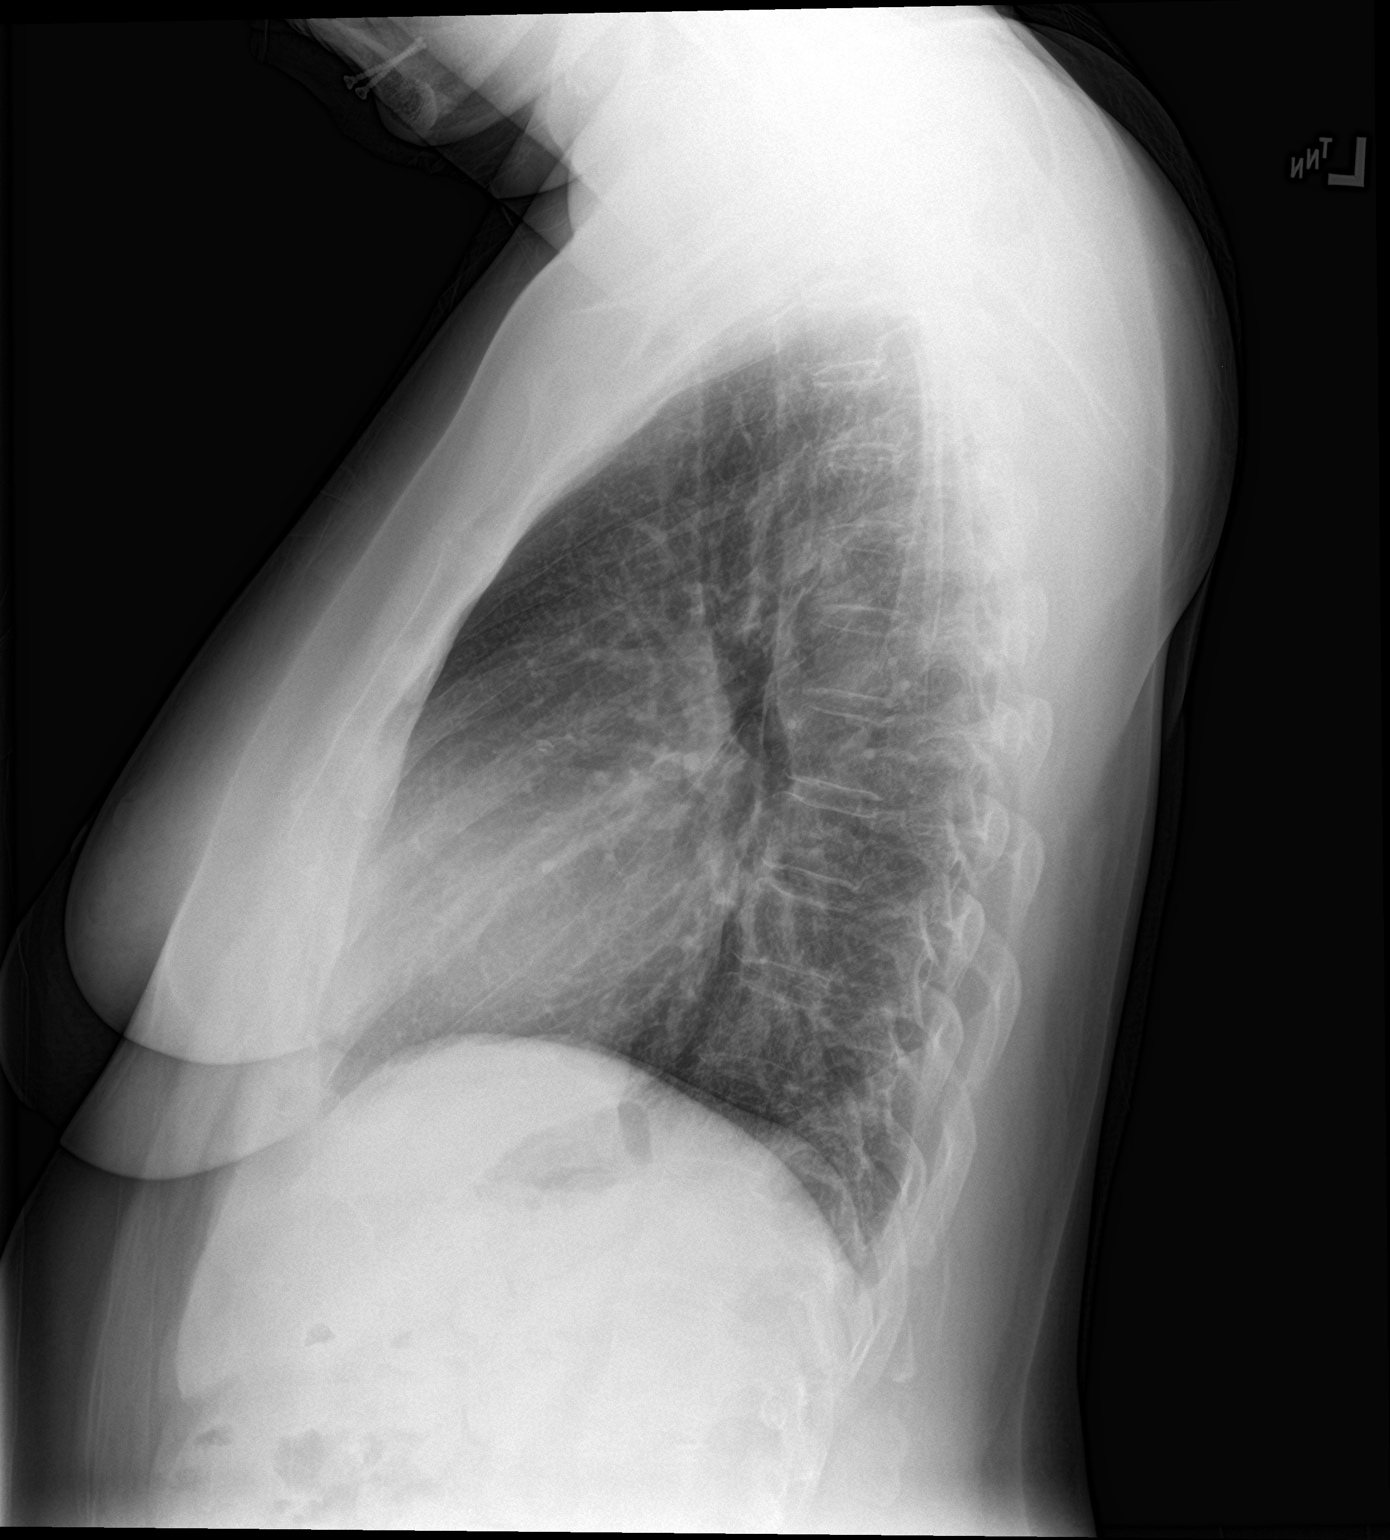

[2 of 2 positions shown; findings below may reference images not displayed]

FINDINGS: Heart size and pulmonary vascularity are normal. No infiltrates or
effusions. Accentuation of the interstitial markings is most likely
chronic and appears unchanged since prior exams. No bone
abnormality.
IMPRESSION: No acute abnormalities.

## 2019-03-18 ENCOUNTER — Ambulatory Visit (INDEPENDENT_AMBULATORY_CARE_PROVIDER_SITE_OTHER): Payer: Medicare Other

## 2019-03-18 ENCOUNTER — Other Ambulatory Visit: Payer: Self-pay

## 2019-03-18 VITALS — Temp 96.0°F | Ht 69.0 in | Wt 224.6 lb

## 2019-03-18 DIAGNOSIS — Z0000 Encounter for general adult medical examination without abnormal findings: Secondary | ICD-10-CM
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
## 2019-03-18 NOTE — Progress Notes (Signed)
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 include trial of muscle relaxant and also addition of NSAID therapy.  Would be cautious of NSAID therapy in the context of patient being on Plavix and increasing her bleeding risk.  Patient endorsed understanding.  Pharmacotherapy (current): Medications ordered:  Meds ordered this encounter  Medications  . amitriptyline (ELAVIL) 50 MG tablet    Sig: Take 1 tablet (50 mg total) by mouth at bedtime.    Dispense:  1 tablet    Refill:  2   Medications administered during this visit: Damyra C. Kivi had no medications administered during this visit.   Pharmacological management options:  Opioid Analgesics: Focus on non-opioid analgesics  Membrane stabilizer: Continue Lyrica 200 mg twice daily, increase amitriptyline to 50 mg nightly.  Future considerations include titrating up amitriptyline or replacing with Cymbalta.  Muscle relaxant: To be determined at a later time  NSAID: To be  determined at a later time  Other analgesic(s): To be determined at a later time   Interventional management options: Ms. Muratalla was informed that there is no guarantee that she would be a candidate for interventional therapies. The decision will be based on the results of diagnostic studies, as well as Ms. Silversmith's risk profile.  Procedure(s) under consideration: Patient currently on Plavix, would not recommend any interventional therapy at this time. Lumbar facet medial branch nerve blocks Lumbar epidural steroid injection   Provider-requested follow-up: Return in about 6 weeks (around 05/05/2019) for Medication Management, virtual.  Future Appointments  Date Time Provider [place]  03/24/2019 11:30 AM Gillis Santa, MD [facility] None  03/30/2019 11:00 AM Minna Merritts, MD [facility]    Primary Care Physician: Valerie Roys, DO Location: [facility] Note by: Gillis Santa, MD Date: 03/24/2019; Time: 11:05 AM

## 2019-03-18 NOTE — Patient Instructions (Signed)
Susan Macias , Thank you for taking time to come for your Medicare Wellness Visit. I appreciate your ongoing commitment to your health goals. Please review the following plan we discussed and let me know if I can assist you in the future.   Screening recommendations/referrals: Colonoscopy: not indicated  Mammogram: ordered Bone Density: not indicated  Recommended yearly ophthalmology/optometry visit for glaucoma screening and checkup Recommended yearly dental visit for hygiene and checkup  Vaccinations: Influenza vaccine: due 08/2019 Pneumococcal vaccine: not indicated Tdap vaccine: due, please get next time your in office Shingles vaccine: not indicated     Advanced directives: please pick up packet next time your in office  Conditions/risks identified: counselor information provided below as discussed  Next appointment: Follow up in one year for your annual wellness exam.   Preventive Care 40-64 Years, Female Preventive care refers to lifestyle choices and visits with your health care provider that can promote health and wellness. What does preventive care include?  A yearly physical exam. This is also called an annual well check.  Dental exams once or twice a year.  Routine eye exams. Ask your health care provider how often you should have your eyes checked.  Personal lifestyle choices, including:  Daily care of your teeth and gums.  Regular physical activity.  Eating a healthy diet.  Avoiding tobacco and drug use.  Limiting alcohol use.  Practicing safe sex.  Taking low-dose aspirin daily starting at age 47.  Taking vitamin and mineral supplements as recommended by your health care provider. What happens during an annual well check? The services and screenings done by your health care provider during your annual well check will depend on your age, overall health, lifestyle risk factors, and family history of disease. Counseling  Your health care provider may ask  you questions about your:  Alcohol use.  Tobacco use.  Drug use.  Emotional well-being.  Home and relationship well-being.  Sexual activity.  Eating habits.  Work and work Statistician.  Method of birth control.  Menstrual cycle.  Pregnancy history. Screening  You may have the following tests or measurements:  Height, weight, and BMI.  Blood pressure.  Lipid and cholesterol levels. These may be checked every 5 years, or more frequently if you are over 47 years old.  Skin check.  Lung cancer screening. You may have this screening every year starting at age 47 if you have a 30-pack-year history of smoking and currently smoke or have quit within the past 15 years.  Fecal occult blood test (FOBT) of the stool. You may have this test every year starting at age 47.  Flexible sigmoidoscopy or colonoscopy. You may have a sigmoidoscopy every 5 years or a colonoscopy every 10 years starting at age 47.  Hepatitis C blood test.  Hepatitis B blood test.  Sexually transmitted disease (STD) testing.  Diabetes screening. This is done by checking your blood sugar (glucose) after you have not eaten for a while (fasting). You may have this done every 1-3 years.  Mammogram. This may be done every 1-2 years. Talk to your health care provider about when you should start having regular mammograms. This may depend on whether you have a family history of breast cancer.  BRCA-related cancer screening. This may be done if you have a family history of breast, ovarian, tubal, or peritoneal cancers.  Pelvic exam and Pap test. This may be done every 3 years starting at age 47. Starting at age 47, this may be done every  5 years if you have a Pap test in combination with an HPV test.  Bone density scan. This is done to screen for osteoporosis. You may have this scan if you are at high risk for osteoporosis. Discuss your test results, treatment options, and if necessary, the need for more tests  with your health care provider. Vaccines  Your health care provider may recommend certain vaccines, such as:  Influenza vaccine. This is recommended every year.  Tetanus, diphtheria, and acellular pertussis (Tdap, Td) vaccine. You may need a Td booster every 10 years.  Zoster vaccine. You may need this after age 8.  Pneumococcal 13-valent conjugate (PCV13) vaccine. You may need this if you have certain conditions and were not previously vaccinated.  Pneumococcal polysaccharide (PPSV23) vaccine. You may need one or two doses if you smoke cigarettes or if you have certain conditions. Talk to your health care provider about which screenings and vaccines you need and how often you need them. This information is not intended to replace advice given to you by your health care provider. Make sure you discuss any questions you have with your health care provider. Document Released: 12/15/2015 Document Revised: 08/07/2016 Document Reviewed: 09/19/2015 Elsevier Interactive Patient Education  2017 [place] Prevention in the Home Falls can cause injuries. They can happen to people of all ages. There are many things you can do to make your home safe and to help prevent falls. What can I do on the outside of my home?  Regularly fix the edges of walkways and driveways and fix any cracks.  Remove anything that might make you trip as you walk through a door, such as a raised step or threshold.  Trim any bushes or trees on the path to your home.  Use bright outdoor lighting.  Clear any walking paths of anything that might make someone trip, such as rocks or tools.  Regularly check to see if handrails are loose or broken. Make sure that both sides of any steps have handrails.  Any raised decks and porches should have guardrails on the edges.  Have any leaves, snow, or ice cleared regularly.  Use sand or salt on walking paths during winter.  Clean up any spills in your garage  right away. This includes oil or grease spills. What can I do in the bathroom?  Use night lights.  Install grab bars by the toilet and in the tub and shower. Do not use towel bars as grab bars.  Use non-skid mats or decals in the tub or shower.  If you need to sit down in the shower, use a plastic, non-slip stool.  Keep the floor dry. Clean up any water that spills on the floor as soon as it happens.  Remove soap buildup in the tub or shower regularly.  Attach bath mats securely with double-sided non-slip rug tape.  Do not have throw rugs and other things on the floor that can make you trip. What can I do in the bedroom?  Use night lights.  Make sure that you have a light by your bed that is easy to reach.  Do not use any sheets or blankets that are too big for your bed. They should not hang down onto the floor.  Have a firm chair that has side arms. You can use this for support while you get dressed.  Do not have throw rugs and other things on the floor that can make you trip. What can I  do in the kitchen?  Clean up any spills right away.  Avoid walking on wet floors.  Keep items that you use a lot in easy-to-reach places.  If you need to reach something above you, use a strong step stool that has a grab bar.  Keep electrical cords out of the way.  Do not use floor polish or wax that makes floors slippery. If you must use wax, use non-skid floor wax.  Do not have throw rugs and other things on the floor that can make you trip. What can I do with my stairs?  Do not leave any items on the stairs.  Make sure that there are handrails on both sides of the stairs and use them. Fix handrails that are broken or loose. Make sure that handrails are as long as the stairways.  Check any carpeting to make sure that it is firmly attached to the stairs. Fix any carpet that is loose or worn.  Avoid having throw rugs at the top or bottom of the stairs. If you do have throw rugs,  attach them to the floor with carpet tape.  Make sure that you have a light switch at the top of the stairs and the bottom of the stairs. If you do not have them, ask someone to add them for you. What else can I do to help prevent falls?  Wear shoes that:  Do not have high heels.  Have rubber bottoms.  Are comfortable and fit you well.  Are closed at the toe. Do not wear sandals.  If you use a stepladder:  Make sure that it is fully opened. Do not climb a closed stepladder.  Make sure that both sides of the stepladder are locked into place.  Ask someone to hold it for you, if possible.  Clearly mark and make sure that you can see:  Any grab bars or handrails.  First and last steps.  Where the edge of each step is.  Use tools that help you move around (mobility aids) if they are needed. These include:  Canes.  Walkers.  Scooters.  Crutches.  Turn on the lights when you go into a dark area. Replace any light bulbs as soon as they burn out.  Set up your furniture so you have a clear path. Avoid moving your furniture around.  If any of your floors are uneven, fix them.  If there are any pets around you, be aware of where they are.  Review your medicines with your doctor. Some medicines can make you feel dizzy. This can increase your chance of falling. Ask your doctor what other things that you can do to help prevent falls. This information is not intended to replace advice given to you by your health care provider. Make sure you discuss any questions you have with your health care provider. Document Released: 09/14/2009 Document Revised: 04/25/2016 Document Reviewed: 12/23/2014 Elsevier Interactive Patient Education  2017 [place]  Self Referral:  1. [facility] [place]   Address: [place], [place], [place] [address] Hours: Open today  9AM-7PM Phone: [phone]  2. [place],  [place] Address: [address] [place], [place], [place] [address] Phone: [phone]   [place]   Address: [place], [place], [place] [address] Hours: Tues 4:15PM-8PM // Weds 9am - 12pm // Raynelle Dick 1pm -  8pm (Closed other days) Phone: [phone]  [place]  Self Referral [facility]) [place] [address], [place], [place] [address] Phone: [phone]  [employer], available walk-in 9am-4pm M-F [place], [place] [address] Hours: [place] (M-F, walk in available) [phone] [place]   Address: [place], [place], [place] [address] Hours: 8AM-5PM (accepts walk in to establish) Phone: [phone]  ----------------------------------------------  [facility] (All ages) [address] [place], [id number] Phone: [phone] (Option 1) [email or web address]  Dr Carlos Levering Kasandra Knudsen

## 2019-03-22 ENCOUNTER — Telehealth: Payer: Self-pay

## 2019-03-22 NOTE — Telephone Encounter (Signed)
Copied from CRM [id number]. Topic: Referral - Status >> Mar 22, 2019 11:14 AM Ricarda Frame D wrote: 03/22/2019 Left message on  voicemail regarding community resources.MA

## 2019-03-23 ENCOUNTER — Other Ambulatory Visit: Payer: Self-pay

## 2019-03-23 ENCOUNTER — Encounter: Payer: Self-pay | Admitting: Family Medicine

## 2019-03-23 ENCOUNTER — Encounter: Payer: Self-pay | Admitting: Student in an Organized Health Care Education/Training Program

## 2019-03-23 ENCOUNTER — Ambulatory Visit (INDEPENDENT_AMBULATORY_CARE_PROVIDER_SITE_OTHER): Payer: Medicare Other | Admitting: Family Medicine

## 2019-03-23 ENCOUNTER — Telehealth: Payer: Self-pay

## 2019-03-23 VITALS — BP 134/86 | HR 121 | Temp 97.2°F | Wt 226.0 lb

## 2019-03-23 DIAGNOSIS — J301 Allergic rhinitis due to pollen: Secondary | ICD-10-CM | POA: Diagnosis not present

## 2019-03-23 DIAGNOSIS — F419 Anxiety disorder, unspecified: Secondary | ICD-10-CM | POA: Diagnosis not present

## 2019-03-23 DIAGNOSIS — G4700 Insomnia, unspecified: Secondary | ICD-10-CM

## 2019-03-23 DIAGNOSIS — J309 Allergic rhinitis, unspecified: Secondary | ICD-10-CM | POA: Insufficient documentation

## 2019-03-23 DIAGNOSIS — R000 Tachycardia, unspecified: Secondary | ICD-10-CM

## 2019-03-23 DIAGNOSIS — R202 Paresthesia of skin: Secondary | ICD-10-CM

## 2019-03-23 DIAGNOSIS — F331 Major depressive disorder, recurrent, moderate: Secondary | ICD-10-CM

## 2019-03-23 MED ORDER — METOPROLOL TARTRATE 50 MG PO TABS: 50.0000 mg | ORAL_TABLET | Freq: Two times a day (BID) | ORAL | 1 refills | Status: DC

## 2019-03-23 MED ORDER — MONTELUKAST SODIUM 10 MG PO TABS: 10.0000 mg | ORAL_TABLET | Freq: Every day | ORAL | 3 refills | Status: DC

## 2019-03-23 MED ORDER — CETIRIZINE HCL 10 MG PO TABS: 10.0000 mg | ORAL_TABLET | Freq: Every day | ORAL | 11 refills | Status: DC

## 2019-03-23 MED ORDER — PREGABALIN 200 MG PO CAPS: 200.0000 mg | ORAL_CAPSULE | Freq: Three times a day (TID) | ORAL | 5 refills | Status: DC
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
## 2019-03-23 NOTE — Assessment & Plan Note (Signed)
Will start zyrtec and singulair. Recheck 1 month. Call with any concerns.
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
## 2019-03-23 NOTE — Telephone Encounter (Signed)
Copied from CRM [id number]. Topic: Referral - Status >> Mar 23, 2019 10:38 AM Ricarda Frame D wrote: 03/23/2019 Spoke with client about community resources for, household goods, furniture, Paediatric nurse, and Scientist, product/process development. Also emailed resources. Gave client my name and number for any further assistance.MA

## 2019-03-24 ENCOUNTER — Ambulatory Visit
Payer: Medicare Other | Attending: Student in an Organized Health Care Education/Training Program | Admitting: Student in an Organized Health Care Education/Training Program

## 2019-03-24 VITALS — Ht 69.0 in | Wt 222.0 lb

## 2019-03-24 DIAGNOSIS — G8929 Other chronic pain: Secondary | ICD-10-CM

## 2019-03-24 DIAGNOSIS — G894 Chronic pain syndrome: Secondary | ICD-10-CM

## 2019-03-24 DIAGNOSIS — M419 Scoliosis, unspecified: Secondary | ICD-10-CM | POA: Diagnosis not present

## 2019-03-24 DIAGNOSIS — M797 Fibromyalgia: Secondary | ICD-10-CM

## 2019-03-24 DIAGNOSIS — M7918 Myalgia, other site: Secondary | ICD-10-CM | POA: Diagnosis not present

## 2019-03-24 DIAGNOSIS — M545 Low back pain, unspecified: Secondary | ICD-10-CM

## 2019-03-24 DIAGNOSIS — Z7901 Long term (current) use of anticoagulants: Secondary | ICD-10-CM | POA: Diagnosis not present

## 2019-03-24 MED ORDER — AMITRIPTYLINE HCL 50 MG PO TABS: 50.0000 mg | ORAL_TABLET | Freq: Every day | ORAL | 2 refills | Status: DC

## 2019-03-27 ENCOUNTER — Other Ambulatory Visit: Payer: Self-pay | Admitting: Family Medicine

## 2019-03-28 ENCOUNTER — Other Ambulatory Visit: Payer: Self-pay | Admitting: Family Medicine
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
## 2019-03-30 ENCOUNTER — Other Ambulatory Visit: Payer: Self-pay

## 2019-03-30 ENCOUNTER — Telehealth (INDEPENDENT_AMBULATORY_CARE_PROVIDER_SITE_OTHER): Payer: Medicare Other | Admitting: Cardiovascular Disease

## 2019-03-30 DIAGNOSIS — E782 Mixed hyperlipidemia: Secondary | ICD-10-CM | POA: Diagnosis not present

## 2019-03-30 DIAGNOSIS — I10 Essential (primary) hypertension: Secondary | ICD-10-CM

## 2019-03-30 DIAGNOSIS — M797 Fibromyalgia: Secondary | ICD-10-CM | POA: Diagnosis not present

## 2019-03-30 DIAGNOSIS — G894 Chronic pain syndrome: Secondary | ICD-10-CM | POA: Diagnosis not present

## 2019-03-30 DIAGNOSIS — F17200 Nicotine dependence, unspecified, uncomplicated: Secondary | ICD-10-CM

## 2019-03-30 DIAGNOSIS — I25118 Atherosclerotic heart disease of native coronary artery with other forms of angina pectoris: Secondary | ICD-10-CM | POA: Diagnosis not present

## 2019-03-30 MED ORDER — METOPROLOL TARTRATE 50 MG PO TABS: 50.0000 mg | ORAL_TABLET | ORAL | 3 refills | Status: DC

## 2019-03-30 NOTE — Patient Instructions (Addendum)
Medication Instructions:  Your physician has recommended you make the following change in your medication:  1. CONTINUE Metoprolol Tartrate 50 mg twice a day. Please take extra 1/2 tablet (25 mg) to 1 tablet (50 mg) as needed for tachycardia.   If you need a refill on your cardiac medications before your next appointment, please call your pharmacy.    Lab work: No new labs needed   If you have labs (blood work) drawn today and your tests are completely normal, you will receive your results only by: Marland Kitchen MyChart Message (if you have MyChart) OR . A paper copy in the mail If you have any lab test that is abnormal or we need to change your treatment, we will call you to review the results.   Testing/Procedures: No new testing needed   Follow-Up: At [facility], you and your health needs are our priority.  As part of our continuing mission to provide you with exceptional heart care, we have created designated Provider Care Teams.  These Care Teams include your primary Cardiologist (physician) and Advanced Practice Providers (APPs -  Physician Assistants and Nurse Practitioners) who all work together to provide you with the care you need, when you need it.  . You will need a follow up appointment in 12 months .   Please call our office 2 months in advance to schedule this appointment.    . Providers on your designated Care Team:   . Nicolasa Ducking, NP . Eula Listen, PA-C . Marisue Ivan, PA-C  Any Other Special Instructions Will Be Listed Below (If Applicable).  For educational health videos Log in to : [email or web address] Or : [email or web address], password : [email or web address]

## 2019-04-22 ENCOUNTER — Encounter: Payer: Self-pay | Admitting: Family Medicine

## 2019-04-22 ENCOUNTER — Other Ambulatory Visit: Payer: Self-pay

## 2019-04-22 ENCOUNTER — Ambulatory Visit (INDEPENDENT_AMBULATORY_CARE_PROVIDER_SITE_OTHER): Payer: Medicare Other | Admitting: Family Medicine

## 2019-04-22 ENCOUNTER — Other Ambulatory Visit: Payer: Self-pay | Admitting: Family Medicine

## 2019-04-22 VITALS — BP 135/80 | HR 99 | Wt 224.0 lb

## 2019-04-22 DIAGNOSIS — J301 Allergic rhinitis due to pollen: Secondary | ICD-10-CM | POA: Diagnosis not present

## 2019-04-22 DIAGNOSIS — K5909 Other constipation: Secondary | ICD-10-CM

## 2019-04-22 DIAGNOSIS — M797 Fibromyalgia: Secondary | ICD-10-CM | POA: Diagnosis not present

## 2019-04-22 DIAGNOSIS — I10 Essential (primary) hypertension: Secondary | ICD-10-CM | POA: Diagnosis not present

## 2019-04-22 DIAGNOSIS — E782 Mixed hyperlipidemia: Secondary | ICD-10-CM | POA: Diagnosis not present

## 2019-04-22 DIAGNOSIS — R000 Tachycardia, unspecified: Secondary | ICD-10-CM

## 2019-04-22 MED ORDER — DICLOFENAC SODIUM 1 % TD GEL: 4.0000 g | Freq: Four times a day (QID) | TRANSDERMAL | 3 refills | Status: DC

## 2019-04-22 MED ORDER — LINACLOTIDE 145 MCG PO CAPS: ORAL_CAPSULE | ORAL | 1 refills | Status: DC

## 2019-04-22 MED ORDER — PANTOPRAZOLE SODIUM 40 MG PO TBEC: 40.0000 mg | DELAYED_RELEASE_TABLET | Freq: Every day | ORAL | 1 refills | Status: DC

## 2019-04-22 MED ORDER — LISINOPRIL 5 MG PO TABS: 5.0000 mg | ORAL_TABLET | Freq: Every day | ORAL | 1 refills | Status: DC

## 2019-04-22 MED ORDER — ATORVASTATIN CALCIUM 80 MG PO TABS: ORAL_TABLET | ORAL | 1 refills | Status: DC

## 2019-04-22 MED ORDER — MONTELUKAST SODIUM 10 MG PO TABS: 10.0000 mg | ORAL_TABLET | Freq: Every day | ORAL | 1 refills | Status: DC

## 2019-04-22 NOTE — Assessment & Plan Note (Signed)
Not under good control. Following with pain management now. Continue to follow with them. No significant benefit from medications at this time. Will start voltaren given hx of MI. Call with any concerns.

## 2019-04-22 NOTE — Assessment & Plan Note (Signed)
Under good control on current regimen. Continue current regimen. Continue to monitor. Call with any concerns. Refills given.   

## 2019-04-22 NOTE — Progress Notes (Signed)
BP 135/80   Pulse 99   Wt 224 lb (101.6 kg)   BMI 33.08 kg/m    Subjective:    Patient ID: Susan Macias, female    DOB: 1971/01/22, 48 y.o.   MRN: [id number]  HPI: Susan Macias is a 48 y.o. female  Chief Complaint  Patient presents with  . Tachycardia  . Numbness  . Joint Swelling    both shoulder's, would like meloxicam  . Allergies   Started on zyrtec and singulair last month for her allergies- she feels like she is doing a lot better. She feels like her allergies have not been as bad. Feels like the rain has been helping.   Paresthesias- Was to increase her lyrica to 200mg  TID last visit. Saw pain management last month and they increased her amitriptyline to 50mg  qHS. They are due to see her back in about 2 weeks. She notes that she is not feeling any better. Continues with the numbness and tingling. Continues with widespread pain throughout. Pain in her shoulders are really bothering her. She feels like she is having swelling in her shoulders. She is feeling like all her joints are swelling. She feels like her lyrica helps with her mood.   She saw cardiology on 4/28 for follow up. Everything was looking good at that time and no changes were made. HR 80 at that time. Rarely dizzy. She has been watching her HR and has seen it in 60s-80s. Going up when she's moving around or doing extra.   HYPERTENSION / HYPERLIPIDEMIA Satisfied with current treatment? yes Duration of hypertension: chronic BP monitoring frequency: not checking BP medication side effects: no Past BP meds: lisinopril, metoprolol Duration of hyperlipidemia: chronic Cholesterol medication side effects: no Cholesterol supplements: none Past cholesterol medications: atorvastatin Medication compliance: excellent compliance Aspirin: yes Recent stressors: yes Recurrent headaches: no Visual changes: no Palpitations: no Dyspnea: no Chest pain: no Lower extremity edema: no Dizzy/lightheaded: no  Relevant  past medical, surgical, family and social history reviewed and updated as indicated. Interim medical history since our last visit reviewed. Allergies and medications reviewed and updated.  Review of Systems  Constitutional: Negative.   HENT: Negative.   Respiratory: Negative.   Cardiovascular: Negative.   Gastrointestinal: Negative.   Genitourinary: Negative.   Musculoskeletal: Positive for arthralgias, back pain, joint swelling, myalgias, neck pain and neck stiffness. Negative for gait problem.  Skin: Negative.   Neurological: Positive for weakness and numbness. Negative for dizziness, tremors, seizures, syncope, facial asymmetry, speech difficulty, light-headedness and headaches.  Psychiatric/Behavioral: Negative.     Per HPI unless specifically indicated above     Objective:    BP 135/80   Pulse 99   Wt 224 lb (101.6 kg)   BMI 33.08 kg/m   Wt Readings from Last 3 Encounters:  04/22/19 224 lb (101.6 kg)  03/23/19 222 lb (100.7 kg)  03/23/19 226 lb (102.5 kg)    Physical Exam Vitals signs and nursing note reviewed.  Constitutional:      General: She is not in acute distress.    Appearance: Normal appearance. She is not ill-appearing, toxic-appearing or diaphoretic.  HENT:     Head: Normocephalic and atraumatic.     Right Ear: External ear normal.     Left Ear: External ear normal.     Nose: Nose normal.     Mouth/Throat:     Mouth: Mucous membranes are moist.     Pharynx: Oropharynx is clear.  Eyes:  General: No scleral icterus.       Right eye: No discharge.        Left eye: No discharge.     Conjunctiva/sclera: Conjunctivae normal.     Pupils: Pupils are equal, round, and reactive to light.  Neck:     Musculoskeletal: Normal range of motion.  Pulmonary:     Effort: Pulmonary effort is normal. No respiratory distress.     Comments: Speaking in full sentences Musculoskeletal: Normal range of motion.  Skin:    Coloration: Skin is not jaundiced or pale.      Findings: No bruising, erythema, lesion or rash.  Neurological:     Mental Status: She is alert and oriented to person, place, and time. Mental status is at baseline.  Psychiatric:        Mood and Affect: Mood normal.        Behavior: Behavior normal.        Thought Content: Thought content normal.        Judgment: Judgment normal.     Results for orders placed or performed in visit on 12/17/18  TSH  Result Value Ref Range   TSH 3.200 0.450 - 4.500 uIU/mL  Lipid Panel w/o Chol/HDL Ratio  Result Value Ref Range   Cholesterol, Total 138 100 - 199 mg/dL   Triglycerides 130142 0 - 149 mg/dL   HDL 59 >86>39 mg/dL   VLDL Cholesterol Cal 28 5 - 40 mg/dL   LDL Calculated 51 0 - 99 mg/dL  Comprehensive metabolic panel  Result Value Ref Range   Glucose 95 65 - 99 mg/dL   BUN 14 6 - 24 mg/dL   Creatinine, Ser 5.780.97 0.57 - 1.00 mg/dL   GFR calc non Af Amer 70 >59 mL/min/1.73   GFR calc Af Amer 81 >59 mL/min/1.73   BUN/Creatinine Ratio 14 9 - 23   Sodium 139 134 - 144 mmol/L   Potassium 4.6 3.5 - 5.2 mmol/L   Chloride 100 96 - 106 mmol/L   CO2 23 20 - 29 mmol/L   Calcium 8.9 8.7 - 10.2 mg/dL   Total Protein 7.0 6.0 - 8.5 g/dL   Albumin 4.1 3.5 - 5.5 g/dL   Globulin, Total 2.9 1.5 - 4.5 g/dL   Albumin/Globulin Ratio 1.4 1.2 - 2.2   Bilirubin Total 0.3 0.0 - 1.2 mg/dL   Alkaline Phosphatase 125 (H) 39 - 117 IU/L   AST 24 0 - 40 IU/L   ALT 37 (H) 0 - 32 IU/L  CBC with Differential/Platelet  Result Value Ref Range   WBC 6.5 3.4 - 10.8 x10E3/uL   RBC 4.37 3.77 - 5.28 x10E6/uL   Hemoglobin 13.1 11.1 - 15.9 g/dL   Hematocrit 46.939.9 62.934.0 - 46.6 %   MCV 91 79 - 97 fL   MCH 30.0 26.6 - 33.0 pg   MCHC 32.8 31.5 - 35.7 g/dL   RDW 52.814.5 41.311.7 - 24.415.4 %   Platelets 254 150 - 450 x10E3/uL   Neutrophils 54 Not Estab. %   Lymphs 38 Not Estab. %   Monocytes 7 Not Estab. %   Eos 1 Not Estab. %   Basos 0 Not Estab. %   Neutrophils Absolute 3.5 1.4 - 7.0 x10E3/uL   Lymphocytes Absolute 2.5 0.7 - 3.1  x10E3/uL   Monocytes Absolute 0.5 0.1 - 0.9 x10E3/uL   EOS (ABSOLUTE) 0.1 0.0 - 0.4 x10E3/uL   Basophils Absolute 0.0 0.0 - 0.2 x10E3/uL   Immature Granulocytes 0 Not Estab. %  Immature Grans (Abs) 0.0 0.0 - 0.1 x10E3/uL      Assessment & Plan:   Problem List Items Addressed This Visit      Cardiovascular and Mediastinum   Essential hypertension - Primary    Under good control on current regimen. Continue current regimen. Continue to monitor. Call with any concerns. Refills given.        Relevant Medications   atorvastatin (LIPITOR) 80 MG tablet   lisinopril (ZESTRIL) 5 MG tablet     Respiratory   Allergic rhinitis    Under good control on current regimen. Continue current regimen. Continue to monitor. Call with any concerns. Refills given.          Digestive   Chronic constipation    Under good control on current regimen. Continue current regimen. Continue to monitor. Call with any concerns. Refills given.          Other   Fibromyalgia (Chronic)    Not under good control. Following with pain management now. Continue to follow with them. No significant benefit from medications at this time. Will start voltaren given hx of MI. Call with any concerns.       Mixed hyperlipidemia    Under good control on current regimen. Continue current regimen. Continue to monitor. Call with any concerns. Refills given.        Relevant Medications   atorvastatin (LIPITOR) 80 MG tablet   lisinopril (ZESTRIL) 5 MG tablet    Other Visit Diagnoses    Tachycardia       Improved with metoprolol. Continue current regimen. Continue to monitor. Call with any concerns.        Follow up plan: Return in about 6 months (around 10/23/2019) for Physical.   . This visit was completed via FaceTime due to the restrictions of the COVID-19 pandemic. All issues as above were discussed and addressed. Physical exam was done as above through visual confirmation on FaceTime. If it was felt that the  patient should be evaluated in the office, they were directed there. The patient verbally consented to this visit. . Location of the patient: home . Location of the provider: home . Those involved with this call:  . Provider: Olevia Perches, DO . CMA: Tiffany Reel, CMA . Front Desk/Registration: Adela Ports  . Time spent on call: 25 minutes with patient face to face via video conference. More than 50% of this time was spent in counseling and coordination of care. 40 minutes total spent in review of patient's record and preparation of their chart.

## 2019-05-05 ENCOUNTER — Ambulatory Visit
Payer: Medicare Other | Attending: Student in an Organized Health Care Education/Training Program | Admitting: Student in an Organized Health Care Education/Training Program

## 2019-05-05 ENCOUNTER — Encounter: Payer: Medicare Other | Admitting: Student in an Organized Health Care Education/Training Program

## 2019-05-05 ENCOUNTER — Other Ambulatory Visit: Payer: Self-pay

## 2019-05-05 DIAGNOSIS — M5134 Other intervertebral disc degeneration, thoracic region: Secondary | ICD-10-CM

## 2019-05-05 DIAGNOSIS — M545 Low back pain: Secondary | ICD-10-CM

## 2019-05-05 DIAGNOSIS — M79602 Pain in left arm: Secondary | ICD-10-CM

## 2019-05-05 DIAGNOSIS — M7918 Myalgia, other site: Secondary | ICD-10-CM

## 2019-05-05 DIAGNOSIS — M797 Fibromyalgia: Secondary | ICD-10-CM

## 2019-05-05 DIAGNOSIS — Z7901 Long term (current) use of anticoagulants: Secondary | ICD-10-CM

## 2019-05-05 DIAGNOSIS — G894 Chronic pain syndrome: Secondary | ICD-10-CM | POA: Diagnosis not present

## 2019-05-05 DIAGNOSIS — R52 Pain, unspecified: Secondary | ICD-10-CM

## 2019-05-05 DIAGNOSIS — M542 Cervicalgia: Secondary | ICD-10-CM

## 2019-05-05 DIAGNOSIS — G8929 Other chronic pain: Secondary | ICD-10-CM

## 2019-05-05 DIAGNOSIS — M79601 Pain in right arm: Secondary | ICD-10-CM

## 2019-05-05 DIAGNOSIS — M5136 Other intervertebral disc degeneration, lumbar region: Secondary | ICD-10-CM

## 2019-05-05 DIAGNOSIS — M419 Scoliosis, unspecified: Secondary | ICD-10-CM | POA: Diagnosis not present

## 2019-05-05 MED ORDER — AMITRIPTYLINE HCL 75 MG PO TABS: 75.0000 mg | ORAL_TABLET | Freq: Every day | ORAL | 1 refills | Status: DC
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
## 2019-05-06 ENCOUNTER — Other Ambulatory Visit: Payer: Self-pay | Admitting: Family Medicine

## 2019-05-06 NOTE — Telephone Encounter (Signed)
Requested medication (s) are due for refill today: Too early  Requested medication (s) are on the active medication list: Yes  Last refill:  03/23/19  Future visit scheduled: Yes  Notes to clinic:  See request    Requested Prescriptions  Pending Prescriptions Disp Refills   pregabalin (LYRICA) 200 MG capsule [Pharmacy Med Name: PREGABALIN 200 MG CAPSULE] 60 capsule     Sig: TAKE 1 CAPSULE BY MOUTH TWICE A DAY     Not Delegated - Neurology:  Anticonvulsants - Controlled Failed - 05/06/2019  7:04 AM      Failed - This refill cannot be delegated      Passed - Valid encounter within last 12 months    Recent Outpatient Visits          2 weeks ago Essential hypertension   [facility] [place], [name], DO   1 month ago Tachycardia   [facility] [place], [name], DO   3 months ago Paresthesias   [facility] [place], [place], DO   4 months ago Essential hypertension   [facility] [place], [place], DO   6 months ago Paresthesias   [facility] [place], [place], DO

## 2019-05-06 NOTE — Telephone Encounter (Signed)
Patient notified and verbalized understanding. 

## 2019-05-06 NOTE — Telephone Encounter (Signed)
6 month supply sent in April. Should not need a new Rx

## 2019-05-11 ENCOUNTER — Other Ambulatory Visit: Payer: Self-pay

## 2019-05-11 NOTE — Patient Outreach (Signed)
[facility]) Care Management  05/11/2019  TIKIA SKILTON Oct 14, 1971 [id number]   Medication Adherence call to Mrs. [place] Compliant Voice message left with a call back number. Mrs. Reitz is showing past due on Atorvastatin 80 mg under [place].   [facility] Management Direct Dial [phone]  Fax [phone] [email or web address]

## 2019-05-13 ENCOUNTER — Telehealth: Payer: Self-pay | Admitting: Family Medicine

## 2019-05-13 NOTE — Chronic Care Management (AMB) (Signed)
Chronic Care Management   Note  05/13/2019 Name: HEIRESS WILLIAMSON MRN: [id number] DOB: Aug 15, 1971  Susan Macias is a 48 y.o. year old female who is a primary care patient of Valerie Roys, DO. I reached out to [employer] by phone today in response to a referral sent by Ms. Leeanne Mannan Bady's health plan.    Ms. Lucy was given information about Chronic Care Management services today including:  1. CCM service includes personalized support from designated clinical staff supervised by her physician, including individualized plan of care and coordination with other care providers 2. 24/7 contact phone numbers for assistance for urgent and routine care needs. 3. Service will only be billed when office clinical staff spend 20 minutes or more in a month to coordinate care. 4. Only one practitioner may furnish and bill the service in a calendar month. 5. The patient may stop CCM services at any time (effective at the end of the month) by phone call to the office staff. 6. The patient will be responsible for cost sharing (co-pay) of up to 20% of the service fee (after annual deductible is met).  Patient agreed to services and verbal consent obtained.   Follow up plan: Telephone appointment with CCM team member scheduled for: 05/25/2019  Huson  [email or web address]   ??[phone]

## 2019-05-16 ENCOUNTER — Other Ambulatory Visit: Payer: Self-pay | Admitting: Family Medicine

## 2019-05-25 ENCOUNTER — Ambulatory Visit: Payer: Self-pay | Admitting: *Deleted

## 2019-05-25 ENCOUNTER — Telehealth: Payer: Medicare Other

## 2019-05-25 NOTE — Chronic Care Management (AMB) (Signed)
  Chronic Care Management   Outreach Note  05/25/2019 Name: Susan Macias MRN: [id number] DOB: [birth year]  Referred by: Valerie Roys, DO Reason for referral : Chronic Care Management (Unsuccessful outreach attempt x1)   An unsuccessful telephone outreach was attempted today. The patient was referred to the case management team by for assistance with chronic care management and care coordination.   Follow Up Plan: A HIPPA compliant phone message was left for the patient providing contact information and requesting a return call.  The care management team will reach out to the patient again over the next 30 days.   Merlene Morse Nasirah Sachs RN, BSN Nurse Case Editor, commissioning Family Practice/[facility] Care Management  [phone]) [employer]

## 2019-05-29 ENCOUNTER — Other Ambulatory Visit: Payer: Self-pay | Admitting: Student in an Organized Health Care Education/Training Program

## 2019-06-01 ENCOUNTER — Encounter: Payer: Medicare Other | Admitting: Student in an Organized Health Care Education/Training Program

## 2019-06-07 ENCOUNTER — Encounter: Payer: Medicare Other | Admitting: Student in an Organized Health Care Education/Training Program

## 2019-06-14 ENCOUNTER — Encounter: Payer: Medicare Other | Admitting: Student in an Organized Health Care Education/Training Program

## 2019-06-15 ENCOUNTER — Ambulatory Visit: Payer: Self-pay | Admitting: *Deleted

## 2019-06-15 ENCOUNTER — Telehealth: Payer: Self-pay

## 2019-06-15 DIAGNOSIS — I10 Essential (primary) hypertension: Secondary | ICD-10-CM

## 2019-06-15 NOTE — Chronic Care Management (AMB) (Signed)
  Chronic Care Management   Outreach Note  06/15/2019 Name: Susan Macias MRN: [id number] DOB: 1970-12-31  Referred by: Valerie Roys, DO Reason for referral : Chronic Care Management (Unsuccessful outreach x2 )   A second unsuccessful telephone outreach was attempted today. The patient was referred to the case management team for assistance with chronic care management and care coordination.   Follow Up Plan: A HIPPA compliant phone message was left for the patient providing contact information and requesting a return call.  The care management team will reach out to the patient again over the next 30 days.   Merlene Morse Halli Equihua RN, BSN Nurse Case Editor, commissioning Family Practice/[facility] Care Management  [phone]) [employer]

## 2019-06-23 ENCOUNTER — Encounter: Payer: Self-pay | Admitting: Family Medicine

## 2019-07-13 ENCOUNTER — Other Ambulatory Visit: Payer: Self-pay

## 2019-07-13 ENCOUNTER — Ambulatory Visit
Payer: Medicare Other | Attending: Student in an Organized Health Care Education/Training Program | Admitting: Student in an Organized Health Care Education/Training Program

## 2019-07-13 ENCOUNTER — Encounter: Payer: Self-pay | Admitting: Student in an Organized Health Care Education/Training Program

## 2019-07-13 DIAGNOSIS — Z7901 Long term (current) use of anticoagulants: Secondary | ICD-10-CM | POA: Diagnosis not present

## 2019-07-13 DIAGNOSIS — G8929 Other chronic pain: Secondary | ICD-10-CM

## 2019-07-13 DIAGNOSIS — G894 Chronic pain syndrome: Secondary | ICD-10-CM

## 2019-07-13 DIAGNOSIS — M79601 Pain in right arm: Secondary | ICD-10-CM

## 2019-07-13 DIAGNOSIS — M419 Scoliosis, unspecified: Secondary | ICD-10-CM

## 2019-07-13 DIAGNOSIS — M79602 Pain in left arm: Secondary | ICD-10-CM

## 2019-07-13 DIAGNOSIS — R52 Pain, unspecified: Secondary | ICD-10-CM

## 2019-07-13 DIAGNOSIS — M545 Low back pain, unspecified: Secondary | ICD-10-CM

## 2019-07-13 DIAGNOSIS — M542 Cervicalgia: Secondary | ICD-10-CM

## 2019-07-13 DIAGNOSIS — M5136 Other intervertebral disc degeneration, lumbar region: Secondary | ICD-10-CM

## 2019-07-13 DIAGNOSIS — M7918 Myalgia, other site: Secondary | ICD-10-CM

## 2019-07-13 DIAGNOSIS — M5134 Other intervertebral disc degeneration, thoracic region: Secondary | ICD-10-CM

## 2019-07-13 DIAGNOSIS — M797 Fibromyalgia: Secondary | ICD-10-CM

## 2019-07-13 MED ORDER — AMITRIPTYLINE HCL 100 MG PO TABS: 100.0000 mg | ORAL_TABLET | Freq: Every day | ORAL | 1 refills | Status: DC
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
## 2019-07-14 ENCOUNTER — Encounter: Payer: Self-pay | Admitting: Nurse Practitioner

## 2019-07-14 ENCOUNTER — Encounter: Payer: Medicare Other | Admitting: Student in an Organized Health Care Education/Training Program

## 2019-07-14 ENCOUNTER — Ambulatory Visit (INDEPENDENT_AMBULATORY_CARE_PROVIDER_SITE_OTHER): Payer: Medicare Other | Admitting: Nurse Practitioner

## 2019-07-14 ENCOUNTER — Other Ambulatory Visit: Payer: Self-pay

## 2019-07-14 VITALS — BP 118/77 | HR 78 | Temp 98.2°F

## 2019-07-14 DIAGNOSIS — R300 Dysuria: Secondary | ICD-10-CM | POA: Diagnosis not present

## 2019-07-14 DIAGNOSIS — N3001 Acute cystitis with hematuria: Secondary | ICD-10-CM | POA: Insufficient documentation

## 2019-07-14 MED ORDER — NITROFURANTOIN MONOHYD MACRO 100 MG PO CAPS: 100.0000 mg | ORAL_CAPSULE | Freq: Two times a day (BID) | ORAL | 0 refills | Status: DC

## 2019-07-14 NOTE — Assessment & Plan Note (Signed)
Acute with UA noting 2+ blood, 2+ protein, 3+ LEUK, and many bacteria.  Neg nitrites.  Script for [employer] sent to pharmacy.  Recommend continuing increased fluid intake + adding on Vitamin C or cranberry tablet for comfort.  Will send for culture and patient is aware with contact her if need to change abx.  GC/Chlam urine sent. Return for worsening or continued issues.

## 2019-07-14 NOTE — Patient Instructions (Signed)
Culture and Sensitivity Testing Why am I having this test? Culture and sensitivity testing is used by your health care provider to:  Help diagnose an infection if you have symptoms of an infection.  Identify a targeted treatment so you can get better faster. What is being tested? In this test, a health care provider will take a tissue sample or fluid sample from your body and test it in a lab to see if germs will grow in it. Any germs that grow from the culture will be tested against a variety of medicines to find which medicine works best (sensitivity testing). For example, for an infection that is caused by bacteria, several types of antibiotic medicines may be tested. What kind of sample is taken?        A culture can be performed on a variety of samples from the body, such as:  Blood. Two samples are usually collected from two different sites on the body. These samples are collected by inserting a needle into a blood vessel.  Urine. A urine sample is collected in a germ-free (sterile) container that is provided to you by the lab. You may collect a clean-catch sample at home or at the lab. Your health care provider may give you sterile wipes to clean your vagina or penis to prepare for collecting a clean-catch sample.  Lung secretions (sputum). This is usually collected by having you cough into a sterile container that is provided to you by the lab.  Wound and soft tissue. A sample is usually collected by swabbing the fluid that is coming from your wound. How do I collect samples at home?  You may be asked to collect a urine sample at home. The urine must be collected in a way that prevents the bacteria that is always on the skin (normal flora) from getting into the sample. When collecting your sample, make sure you: ? Use the supplies and instructions that you received from the lab. ? Collect urine only in the sterile cup that you received from the lab. ? Do not let any toilet paper  or stool (feces) get into the cup. ? Refrigerate the sample until you can return it to the lab. ? Return the sample to the lab as instructed.  You may be asked to collect a sputum sample at home. It is important to collect sputum from the lungs and not saliva from your mouth. This can be done by taking several slow, deep breaths, then coughing deeply to bring up sputum from the lungs. Your health care provider will tell you if there are other special instructions for collecting the sputum. When collecting your sample, you may be asked to: ? Not eat or drink anything prior to collecting a sputum sample. ? Rinse your mouth with water before collecting the sample to decrease contamination. How do I prepare for this test? Preparation for the test will vary depending on the type of fluid or tissue that is being cultured. Follow the directions that are provided by your health care provider. How are the results reported? Your test results will be reported as either positive or negative for bacteria.  If enough bacteria grow from your culture, your test result is considered positive.  If many different bacteria grow from your culture, your test result may be reported as contaminated. The test may need to be repeated.  If no bacteria grow from your culture after 24-48 hours, your test result is considered negative. What do the results mean? If  the results are negative, this means:  It is unlikely that you have an infection.  Your test may be repeated if you still have symptoms. If the results are positive, this means:  It is likely that you have an infection. Results of sensitivity testing let your health care provider know which medicines to use in treating your infection. Talk with your health care provider about what your results mean. Questions to ask your health care provider Ask your health care provider, or the department that is doing the test:  When will my results be ready?  How  will I get my results?  What are my treatment options?  What other tests do I need?  What are my next steps? Summary  Culture and sensitivity testing is performed to help diagnose an infection. It may also help your health care provider decide which medicines to use in treating your infection.  This test involves taking a tissue or fluid sample from your body and testing it to see if germs will grow in it. Samples may come from blood, urine, sputum, or a wound site.  If enough bacteria grow from your culture, your test result is considered positive. This means that you likely have an infection.  If no bacteria grow from your culture after 24-48 hours, your test result is considered negative. This makes it unlikely that you have an infection. This information is not intended to replace advice given to you by your health care provider. Make sure you discuss any questions you have with your health care provider. Document Released: 10/31/2008 Document Revised: 03/02/2018 Document Reviewed: 08/13/2017 Elsevier Patient Education  2020 [employer].

## 2019-07-14 NOTE — Progress Notes (Signed)
BP 118/77   Pulse 78   Temp 98.2 F (36.8 C) (Oral)   SpO2 99%    Subjective:    Patient ID: Susan Macias, female    DOB: 1971-03-15, 48 y.o.   MRN: [id number]  HPI: Susan Macias is a 48 y.o. female  Chief Complaint  Patient presents with  . Urinary Tract Infection    pt states she has had burning with urination and low back pain   URINARY SYMPTOMS Started with symptoms on Saturday, noted dysuria and pressure. Dysuria: burning Urinary frequency: yes Urgency: yes Small volume voids: yes Symptom severity: yes Urinary incontinence: no Foul odor: no Hematuria: no Abdominal pain: yes Back pain: yes Suprapubic pain/pressure: yes Flank pain: no Fever:  no Vomiting: no, has had nausea Relief with cranberry juice: no Relief with pyridium: no Status: worse Previous urinary tract infection: no Recurrent urinary tract infection: no Sexual activity: presently sexually active, not practicing safe sex History of sexually transmitted disease: yes, chlamydia and gonorrhea Treatments attempted: increasing fluids   Relevant past medical, surgical, family and social history reviewed and updated as indicated. Interim medical history since our last visit reviewed. Allergies and medications reviewed and updated.  Review of Systems  Constitutional: Negative for activity change, appetite change, diaphoresis, fatigue and fever.  Respiratory: Negative for cough, chest tightness and shortness of breath.   Cardiovascular: Negative for chest pain, palpitations and leg swelling.  Gastrointestinal: Positive for abdominal pain and nausea. Negative for abdominal distention, constipation, diarrhea and vomiting.  Endocrine: Negative for cold intolerance, heat intolerance, polydipsia, polyphagia and polyuria.  Genitourinary: Positive for decreased urine volume, dysuria, frequency and urgency. Negative for dyspareunia, flank pain, genital sores, hematuria, pelvic pain and vaginal discharge.   Musculoskeletal: Positive for back pain.  Neurological: Negative for dizziness, syncope, weakness, light-headedness, numbness and headaches.  Psychiatric/Behavioral: Negative.     Per HPI unless specifically indicated above     Objective:    BP 118/77   Pulse 78   Temp 98.2 F (36.8 C) (Oral)   SpO2 99%   Wt Readings from Last 3 Encounters:  04/22/19 224 lb (101.6 kg)  03/23/19 222 lb (100.7 kg)  03/23/19 226 lb (102.5 kg)    Physical Exam Vitals signs and nursing note reviewed.  Constitutional:      General: She is awake. She is not in acute distress.    Appearance: She is well-developed. She is not ill-appearing.  HENT:     Head: Normocephalic.     Right Ear: Hearing normal.     Left Ear: Hearing normal.     Nose: Nose normal.  Eyes:     General: Lids are normal.        Right eye: No discharge.        Left eye: No discharge.     Conjunctiva/sclera: Conjunctivae normal.     Pupils: Pupils are equal, round, and reactive to light.  Neck:     Musculoskeletal: Normal range of motion and neck supple.  Cardiovascular:     Rate and Rhythm: Normal rate and regular rhythm.     Heart sounds: Normal heart sounds. No murmur. No gallop.   Pulmonary:     Effort: Pulmonary effort is normal. No accessory muscle usage or respiratory distress.     Breath sounds: Normal breath sounds.  Abdominal:     General: Bowel sounds are normal.     Palpations: Abdomen is soft. There is no hepatomegaly or splenomegaly.  Tenderness: There is abdominal tenderness in the suprapubic area. There is no right CVA tenderness or left CVA tenderness.  Musculoskeletal:     Right lower leg: No edema.     Left lower leg: No edema.  Skin:    General: Skin is warm and dry.  Neurological:     Mental Status: She is alert and oriented to person, place, and time.  Psychiatric:        Attention and Perception: Attention normal.        Mood and Affect: Mood normal.        Behavior: Behavior normal.  Behavior is cooperative.        Thought Content: Thought content normal.        Judgment: Judgment normal.     Results for orders placed or performed in visit on 12/17/18  TSH  Result Value Ref Range   TSH 3.200 0.450 - 4.500 uIU/mL  Lipid Panel w/o Chol/HDL Ratio  Result Value Ref Range   Cholesterol, Total 138 100 - 199 mg/dL   Triglycerides 101 0 - 149 mg/dL   HDL 59 >75 mg/dL   VLDL Cholesterol Cal 28 5 - 40 mg/dL   LDL Calculated 51 0 - 99 mg/dL  Comprehensive metabolic panel  Result Value Ref Range   Glucose 95 65 - 99 mg/dL   BUN 14 6 - 24 mg/dL   Creatinine, Ser 1.02 0.57 - 1.00 mg/dL   GFR calc non Af Amer 70 >59 mL/min/1.73   GFR calc Af Amer 81 >59 mL/min/1.73   BUN/Creatinine Ratio 14 9 - 23   Sodium 139 134 - 144 mmol/L   Potassium 4.6 3.5 - 5.2 mmol/L   Chloride 100 96 - 106 mmol/L   CO2 23 20 - 29 mmol/L   Calcium 8.9 8.7 - 10.2 mg/dL   Total Protein 7.0 6.0 - 8.5 g/dL   Albumin 4.1 3.5 - 5.5 g/dL   Globulin, Total 2.9 1.5 - 4.5 g/dL   Albumin/Globulin Ratio 1.4 1.2 - 2.2   Bilirubin Total 0.3 0.0 - 1.2 mg/dL   Alkaline Phosphatase 125 (H) 39 - 117 IU/L   AST 24 0 - 40 IU/L   ALT 37 (H) 0 - 32 IU/L  CBC with Differential/Platelet  Result Value Ref Range   WBC 6.5 3.4 - 10.8 x10E3/uL   RBC 4.37 3.77 - 5.28 x10E6/uL   Hemoglobin 13.1 11.1 - 15.9 g/dL   Hematocrit 58.5 27.7 - 46.6 %   MCV 91 79 - 97 fL   MCH 30.0 26.6 - 33.0 pg   MCHC 32.8 31.5 - 35.7 g/dL   RDW 82.4 23.5 - 36.1 %   Platelets 254 150 - 450 x10E3/uL   Neutrophils 54 Not Estab. %   Lymphs 38 Not Estab. %   Monocytes 7 Not Estab. %   Eos 1 Not Estab. %   Basos 0 Not Estab. %   Neutrophils Absolute 3.5 1.4 - 7.0 x10E3/uL   Lymphocytes Absolute 2.5 0.7 - 3.1 x10E3/uL   Monocytes Absolute 0.5 0.1 - 0.9 x10E3/uL   EOS (ABSOLUTE) 0.1 0.0 - 0.4 x10E3/uL   Basophils Absolute 0.0 0.0 - 0.2 x10E3/uL   Immature Granulocytes 0 Not Estab. %   Immature Grans (Abs) 0.0 0.0 - 0.1 x10E3/uL       Assessment & Plan:   Problem List Items Addressed This Visit      Genitourinary   Acute cystitis with hematuria - Primary    Acute with UA noting  2+ blood, 2+ protein, 3+ LEUK, and many bacteria.  Neg nitrites.  Script for [employer] sent to pharmacy.  Recommend continuing increased fluid intake + adding on Vitamin C or cranberry tablet for comfort.  Will send for culture and patient is aware with contact her if need to change abx.  GC/Chlam urine sent. Return for worsening or continued issues.      Relevant Orders   UA/M w/rflx Culture, Routine   GC/Chlamydia Probe Amp       Follow up plan: Return if symptoms worsen or fail to improve.

## 2019-07-16 ENCOUNTER — Ambulatory Visit (INDEPENDENT_AMBULATORY_CARE_PROVIDER_SITE_OTHER): Payer: Medicare Other | Admitting: *Deleted

## 2019-07-16 ENCOUNTER — Telehealth: Payer: Self-pay

## 2019-07-16 DIAGNOSIS — I10 Essential (primary) hypertension: Secondary | ICD-10-CM

## 2019-07-16 DIAGNOSIS — M797 Fibromyalgia: Secondary | ICD-10-CM

## 2019-07-16 NOTE — Chronic Care Management (AMB) (Signed)
Chronic Care Management   Initial Visit Note  07/16/2019 Name: Susan Macias MRN: [id number] DOB: Jun 26, 1971  Referred by: Valerie Roys, DO Reason for referral : Chronic Care Management (HTN, Pain)   Susan Macias is a 48 y.o. year old female who is a primary care patient of Valerie Roys, DO. The CCM team was consulted for assistance with chronic disease management and care coordination needs.   Review of patient status, including review of consultants reports, relevant laboratory and other test results, and collaboration with appropriate care team members and the patient's provider was performed as part of comprehensive patient evaluation and provision of chronic care management services.    SDOH (Social Determinants of Health) screening performed today. See Care Plan Entry related to challenges with: None   Spoke to the patient on the phone today. Introduced CCM.  Reviewed CCM team available for ongoing needs. Patient stated she did not feel she had CCM needs at this time. Patient reported she was able to stop smoking in 2019.   Medications: Outpatient Encounter Medications as of 07/16/2019  Medication Sig  . amitriptyline (ELAVIL) 100 MG tablet Take 1 tablet (100 mg total) by mouth at bedtime.  Marland Kitchen aspirin 81 MG EC tablet Take 1 tablet (81 mg total) by mouth daily.  Marland Kitchen atorvastatin (LIPITOR) 80 MG tablet TAKE 1 TABLET DAILY AT 6PM  . calcium-vitamin D 250-100 MG-UNIT tablet Take 1 tablet by mouth 2 (two) times daily.  . cetirizine (ZYRTEC) 10 MG tablet Take 1 tablet (10 mg total) by mouth at bedtime.  . clopidogrel (PLAVIX) 75 MG tablet TAKE 1 TABLET (75 MG TOTAL) BY MOUTH DAILY WITH BREAKFAST.  . [facility] ALLERGY 25 MG capsule TAKE 1 CAPSULE BY MOUTH EVERY 6 HOURS AS NEEDED  . diclofenac sodium (VOLTAREN) 1 % GEL Apply 4 g topically 4 (four) times daily.  Marland Kitchen linaclotide (LINZESS) 145 MCG CAPS capsule TAKE 1 CAPSULE (145 MCG TOTAL) BY MOUTH DAILY BEFORE BREAKFAST.  Marland Kitchen lisinopril  (ZESTRIL) 5 MG tablet Take 1 tablet (5 mg total) by mouth daily.  . metoprolol tartrate (LOPRESSOR) 50 MG tablet Take 1 tablet (50 mg total) by mouth as directed. 1 tablet twice a day. May take extra 1/2 tablet (25 mg) up to 1 tablet (50 mg) as needed for tachycardia.  . montelukast (SINGULAIR) 10 MG tablet Take 1 tablet (10 mg total) by mouth at bedtime.  . nitrofurantoin, macrocrystal-monohydrate, (MACROBID) 100 MG capsule Take 1 capsule (100 mg total) by mouth 2 (two) times daily for 5 days.  . nitroGLYCERIN (NITROSTAT) 0.4 MG SL tablet TAKE 1 TABLET BY MOUTH UNDER TONGUE EVERY 5 MIN AS NEEDED FOR CHEST PAIN  . pantoprazole (PROTONIX) 40 MG tablet Take 1 tablet (40 mg total) by mouth daily.  . pregabalin (LYRICA) 200 MG capsule Take 1 capsule (200 mg total) by mouth 3 (three) times daily.  . traZODone (DESYREL) 50 MG tablet TAKE 1/2 TO 1 TABLETS (25-50 MG TOTAL) BY MOUTH AT BEDTIME AS NEEDED FOR SLEEP.  Marland Kitchen valACYclovir (VALTREX) 1000 MG tablet Take 1 tablet (1,000 mg total) by mouth 2 (two) times daily.   No facility-administered encounter medications on file as of 07/16/2019.      Objective:    Susan Macias was given information about Chronic Care Management services today including:  1. CCM service includes personalized support from designated clinical staff supervised by her physician, including individualized plan of care and coordination with other care providers 2. 24/7 contact phone numbers for  assistance for urgent and routine care needs. 3. Service will only be billed when office clinical staff spend 20 minutes or more in a month to coordinate care. 4. Only one practitioner may furnish and bill the service in a calendar month. 5. The patient may stop CCM services at any time (effective at the end of the month) by phone call to the office staff. 6. The patient will be responsible for cost sharing (co-pay) of up to 20% of the service fee (after annual deductible is met).  Patient agreed  to services and verbal consent obtained.   Plan:   The patient has been provided with contact information for the care management team and has been advised to call with any health related questions or concerns.  Patient will call if needs arise.   Merlene Morse Mykaela Arena RN, BSN Nurse Case Editor, [facility]  [phone]) Business Mobile

## 2019-07-16 NOTE — Patient Instructions (Signed)
Thank you allowing the Chronic Care Management Team to be a part of your care! It was a pleasure speaking with you today!   CCM (Chronic Care Management) Team   Reyana Leisey RN, BSN Nurse Care Coordinator  [phone]  Catie Darnelle Maffucci PharmD  Clinical Pharmacist  [phone]  Eula Fried LCSW Clinical Social Worker [phone]      The patient has been provided with contact information for the care management team and has been advised to call with any health related questions or concerns.

## 2019-07-17 ENCOUNTER — Other Ambulatory Visit: Payer: Self-pay | Admitting: Nurse Practitioner

## 2019-07-17 LAB — UA/M W/RFLX CULTURE, ROUTINE
Bilirubin, UA: NEGATIVE
Glucose, UA: NEGATIVE
Ketones, UA: NEGATIVE
Nitrite, UA: NEGATIVE
Specific Gravity, UA: 1.02 (ref 1.005–1.030)
Urobilinogen, Ur: 0.2 mg/dL (ref 0.2–1.0)
pH, UA: 6.5 (ref 5.0–7.5)

## 2019-07-17 LAB — MICROSCOPIC EXAMINATION: WBC, UA: >30 /hpf — AB (ref 0–5)

## 2019-07-17 LAB — URINE CULTURE, REFLEX

## 2019-07-17 MED ORDER — AMOXICILLIN-POT CLAVULANATE 500-125 MG PO TABS: 1.0000 | ORAL_TABLET | Freq: Two times a day (BID) | ORAL | 0 refills | Status: AC

## 2019-07-17 NOTE — Progress Notes (Signed)
Script for Augmentin, as Macrobid not susceptible to current UTI.

## 2019-07-18 LAB — GC/CHLAMYDIA PROBE AMP
Chlamydia trachomatis, NAA: NEGATIVE
Neisseria Gonorrhoeae by PCR: NEGATIVE

## 2019-08-04 ENCOUNTER — Other Ambulatory Visit: Payer: Self-pay | Admitting: Student in an Organized Health Care Education/Training Program

## 2019-09-06 ENCOUNTER — Encounter: Payer: Self-pay | Admitting: Student in an Organized Health Care Education/Training Program

## 2019-09-07 ENCOUNTER — Other Ambulatory Visit: Payer: Self-pay

## 2019-09-07 ENCOUNTER — Ambulatory Visit
Payer: Medicare Other | Attending: Student in an Organized Health Care Education/Training Program | Admitting: Student in an Organized Health Care Education/Training Program

## 2019-09-07 ENCOUNTER — Encounter: Payer: Self-pay | Admitting: Student in an Organized Health Care Education/Training Program

## 2019-09-07 DIAGNOSIS — M797 Fibromyalgia: Secondary | ICD-10-CM

## 2019-09-07 DIAGNOSIS — G894 Chronic pain syndrome: Secondary | ICD-10-CM

## 2019-09-07 DIAGNOSIS — M419 Scoliosis, unspecified: Secondary | ICD-10-CM | POA: Insufficient documentation

## 2019-09-07 DIAGNOSIS — M545 Low back pain: Secondary | ICD-10-CM

## 2019-09-07 DIAGNOSIS — M7918 Myalgia, other site: Secondary | ICD-10-CM | POA: Diagnosis not present

## 2019-09-07 DIAGNOSIS — R52 Pain, unspecified: Secondary | ICD-10-CM

## 2019-09-07 DIAGNOSIS — G8929 Other chronic pain: Secondary | ICD-10-CM

## 2019-09-07 MED ORDER — TIZANIDINE HCL 4 MG PO TABS: 4.0000 mg | ORAL_TABLET | Freq: Three times a day (TID) | ORAL | 1 refills | Status: AC | PRN

## 2019-09-07 MED ORDER — AMITRIPTYLINE HCL 100 MG PO TABS: 100.0000 mg | ORAL_TABLET | Freq: Every day | ORAL | 1 refills | Status: DC
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
## 2019-09-15 ENCOUNTER — Other Ambulatory Visit: Payer: Self-pay | Admitting: Family Medicine

## 2019-09-15 ENCOUNTER — Emergency Department
Admission: EM | Admit: 2019-09-15 | Discharge: 2019-09-16 | Disposition: A | Discharge status: Home / Self Care | Payer: Medicare Other | Attending: Emergency Medicine | Admitting: Emergency Medicine

## 2019-09-15 ENCOUNTER — Other Ambulatory Visit: Payer: Self-pay

## 2019-09-15 ENCOUNTER — Emergency Department: Payer: Medicare Other

## 2019-09-15 DIAGNOSIS — R1111 Vomiting without nausea: Secondary | ICD-10-CM | POA: Diagnosis not present

## 2019-09-15 DIAGNOSIS — F1910 Other psychoactive substance abuse, uncomplicated: Secondary | ICD-10-CM | POA: Insufficient documentation

## 2019-09-15 DIAGNOSIS — Z7982 Long term (current) use of aspirin: Secondary | ICD-10-CM | POA: Insufficient documentation

## 2019-09-15 DIAGNOSIS — Z7902 Long term (current) use of antithrombotics/antiplatelets: Secondary | ICD-10-CM | POA: Diagnosis not present

## 2019-09-15 DIAGNOSIS — I10 Essential (primary) hypertension: Secondary | ICD-10-CM | POA: Insufficient documentation

## 2019-09-15 DIAGNOSIS — Z79899 Other long term (current) drug therapy: Secondary | ICD-10-CM | POA: Insufficient documentation

## 2019-09-15 DIAGNOSIS — Z87891 Personal history of nicotine dependence: Secondary | ICD-10-CM | POA: Diagnosis not present

## 2019-09-15 DIAGNOSIS — R0989 Other specified symptoms and signs involving the circulatory and respiratory systems: Secondary | ICD-10-CM | POA: Diagnosis not present

## 2019-09-15 DIAGNOSIS — R4182 Altered mental status, unspecified: Secondary | ICD-10-CM | POA: Diagnosis present

## 2019-09-15 DIAGNOSIS — I259 Chronic ischemic heart disease, unspecified: Secondary | ICD-10-CM | POA: Diagnosis not present

## 2019-09-15 DIAGNOSIS — F1210 Cannabis abuse, uncomplicated: Secondary | ICD-10-CM | POA: Diagnosis not present

## 2019-09-15 DIAGNOSIS — R55 Syncope and collapse: Secondary | ICD-10-CM | POA: Diagnosis not present

## 2019-09-15 DIAGNOSIS — R000 Tachycardia, unspecified: Secondary | ICD-10-CM | POA: Diagnosis not present

## 2019-09-15 LAB — CBC
HCT: 37.2 % (ref 36.0–46.0)
Hemoglobin: 12.2 g/dL (ref 12.0–15.0)
MCH: 29.5 pg (ref 26.0–34.0)
MCHC: 32.8 g/dL (ref 30.0–36.0)
MCV: 89.9 fL (ref 80.0–100.0)
Platelets: 227 10*3/uL (ref 150–400)
RBC: 4.14 MIL/uL (ref 3.87–5.11)
RDW: 15 % (ref 11.5–15.5)
WBC: 8.5 10*3/uL (ref 4.0–10.5)
nRBC: 0 % (ref 0.0–0.2)

## 2019-09-15 LAB — URINALYSIS, COMPLETE (UACMP) WITH MICROSCOPIC
Bacteria, UA: NONE SEEN
Bilirubin Urine: NEGATIVE
Glucose, UA: NEGATIVE mg/dL
Hgb urine dipstick: NEGATIVE
Ketones, ur: NEGATIVE mg/dL
Leukocytes,Ua: NEGATIVE
Nitrite: NEGATIVE
Protein, ur: 30 mg/dL — AB
Specific Gravity, Urine: 1.024 (ref 1.005–1.030)
pH: 5 (ref 5.0–8.0)

## 2019-09-15 LAB — COMPREHENSIVE METABOLIC PANEL
ALT: 24 U/L (ref 0–44)
AST: 21 U/L (ref 15–41)
Albumin: 3.7 g/dL (ref 3.5–5.0)
Alkaline Phosphatase: 95 U/L (ref 38–126)
Anion gap: 9 (ref 5–15)
BUN: 11 mg/dL (ref 6–20)
CO2: 23 mmol/L (ref 22–32)
Calcium: 8.4 mg/dL — ABNORMAL LOW (ref 8.9–10.3)
Chloride: 105 mmol/L (ref 98–111)
Creatinine, Ser: 0.87 mg/dL (ref 0.44–1.00)
GFR calc Af Amer: >60 mL/min (ref >60)
GFR calc non Af Amer: >60 mL/min (ref >60)
Glucose, Bld: 129 mg/dL — ABNORMAL HIGH (ref 70–99)
Potassium: 3 mmol/L — ABNORMAL LOW (ref 3.5–5.1)
Sodium: 137 mmol/L (ref 135–145)
Total Bilirubin: 0.8 mg/dL (ref 0.3–1.2)
Total Protein: 7.2 g/dL (ref 6.5–8.1)

## 2019-09-15 LAB — URINE DRUG SCREEN, QUALITATIVE (ARMC ONLY)
Amphetamines, Ur Screen: NOT DETECTED
Barbiturates, Ur Screen: NOT DETECTED
Benzodiazepine, Ur Scrn: NOT DETECTED
Cannabinoid 50 Ng, Ur ~~LOC~~: POSITIVE — AB
Cocaine Metabolite,Ur ~~LOC~~: NOT DETECTED
MDMA (Ecstasy)Ur Screen: NOT DETECTED
Methadone Scn, Ur: NOT DETECTED
Opiate, Ur Screen: NOT DETECTED
Phencyclidine (PCP) Ur S: NOT DETECTED
Tricyclic, Ur Screen: POSITIVE — AB

## 2019-09-15 LAB — ETHANOL: Alcohol, Ethyl (B): 76 mg/dL — ABNORMAL HIGH (ref <10)

## 2019-09-15 LAB — PREGNANCY, URINE: Preg Test, Ur: NEGATIVE

## 2019-09-15 LAB — GLUCOSE, CAPILLARY: Glucose-Capillary: 104 mg/dL — ABNORMAL HIGH (ref 70–99)

## 2019-09-15 LAB — ACETAMINOPHEN LEVEL: Acetaminophen (Tylenol), Serum: <10 ug/mL — ABNORMAL LOW (ref 10–30)

## 2019-09-15 LAB — HCG, QUANTITATIVE, PREGNANCY: hCG, Beta Chain, Quant, S: 1 m[IU]/mL (ref <5)

## 2019-09-15 LAB — SALICYLATE LEVEL: Salicylate Lvl: <7 mg/dL (ref 2.8–30.0)

## 2019-09-15 MED ORDER — SODIUM CHLORIDE 0.9 % IV BOLUS
1000.0000 mL | Freq: Once | INTRAVENOUS | Status: AC
  Administered 2019-09-15: 1000 mL via INTRAVENOUS
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
## 2019-09-15 NOTE — Discharge Instructions (Signed)
Please do not combine your current medications with alcohol.  I suspect a small amount of alcohol in combination with your current prescribed medications caused a profound effect and you to be very lethargic.  No driving tonight, follow-up closely with your doctor.  Use only your medications as prescribed and avoid use of all alcohol

## 2019-09-15 NOTE — ED Provider Notes (Signed)
Patient reassessed.  She has been resting but easily awakens to voice.  States that she feels fine right now states that she feels like she just had too much to drink earlier was also having a very stressful day in the setting of friend's funeral.  She denies any pain nausea or vomiting.  No measured fevers.  Stable ambulating steady gait.  She denies any SI or HI.  At this point I do feel patient is appropriate for discharge home.   Merlyn Lot, MD 09/15/19 [phone]

## 2019-09-15 NOTE — ED Triage Notes (Signed)
Pt arrived via [facility] from a [place] with family. Pt has been drinking and also on a new diet medication. Pt was found asleep by ems.

## 2019-09-20 ENCOUNTER — Other Ambulatory Visit: Payer: Self-pay

## 2019-09-20 NOTE — Patient Outreach (Signed)
[facility]) Care Management  09/20/2019  JALEEAH SLIGHT Oct 05, 1971 [id number]   Medication Adherence call to Mrs. Susan Macias Voice message left with a call back number. Mrs. Dyk is showing past due on lisinopril 5 mg under [place].   [place] Management Direct Dial [phone]  Fax [phone] [email or web address]

## 2019-09-20 NOTE — Patient Outreach (Signed)
[facility]) Care Management  09/20/2019  Susan Macias May 01, 1971 [id number]   Medication Adherence call to [name] Compliant Voice message left with a call back number. [name] is showing past due on Lisinopril 5 mg under [place].   [place] Management Direct Dial [phone]  Fax [phone] [name]

## 2019-09-29 ENCOUNTER — Other Ambulatory Visit: Payer: Self-pay | Admitting: Student in an Organized Health Care Education/Training Program

## 2019-09-29 DIAGNOSIS — M7918 Myalgia, other site: Secondary | ICD-10-CM

## 2019-09-29 DIAGNOSIS — M797 Fibromyalgia: Secondary | ICD-10-CM

## 2019-09-29 DIAGNOSIS — G894 Chronic pain syndrome: Secondary | ICD-10-CM

## 2019-10-02 ENCOUNTER — Other Ambulatory Visit: Payer: Self-pay | Admitting: Family Medicine

## 2019-10-02 NOTE — Telephone Encounter (Signed)
Requested medication (s) are due for refill today: yes  Requested medication (s) are on the active medication list: yes  Last refill:  08/20/2019  Future visit scheduled: no  Notes to clinic:  Refill cannot be delegated    Requested Prescriptions  Pending Prescriptions Disp Refills   pregabalin (LYRICA) 200 MG capsule [Pharmacy Med Name: PREGABALIN 200 MG CAPSULE] 90 capsule     Sig: Take 1 capsule (200 mg total) by mouth 3 (three) times daily.     Not Delegated - Neurology:  Anticonvulsants - Controlled Failed - 10/02/2019 10:28 AM      Failed - This refill cannot be delegated      Passed - Valid encounter within last 12 months    Recent Outpatient Visits          2 months ago Acute cystitis with hematuria   [place], Henrine Screws T, NP   5 months ago Essential hypertension   [place], [place], DO   6 months ago Tachycardia   [place], [name], DO   8 months ago [place], [name], DO   9 months ago Essential hypertension   [place], [place], DO

## 2019-10-04 NOTE — Telephone Encounter (Signed)
Needs follow up virtual OK

## 2019-10-04 NOTE — Telephone Encounter (Signed)
Called pt, scheduled virtual appt for tomorrow 10/05/2019.

## 2019-10-05 ENCOUNTER — Other Ambulatory Visit: Payer: Self-pay

## 2019-10-05 ENCOUNTER — Ambulatory Visit (INDEPENDENT_AMBULATORY_CARE_PROVIDER_SITE_OTHER): Payer: Medicare Other | Admitting: Family Medicine

## 2019-10-05 ENCOUNTER — Encounter: Payer: Self-pay | Admitting: Family Medicine

## 2019-10-05 DIAGNOSIS — M797 Fibromyalgia: Secondary | ICD-10-CM

## 2019-10-05 DIAGNOSIS — E782 Mixed hyperlipidemia: Secondary | ICD-10-CM | POA: Diagnosis not present

## 2019-10-05 DIAGNOSIS — I10 Essential (primary) hypertension: Secondary | ICD-10-CM | POA: Diagnosis not present

## 2019-10-05 DIAGNOSIS — K5909 Other constipation: Secondary | ICD-10-CM

## 2019-10-05 DIAGNOSIS — R7301 Impaired fasting glucose: Secondary | ICD-10-CM

## 2019-10-05 MED ORDER — NITROGLYCERIN 0.4 MG SL SUBL: SUBLINGUAL_TABLET | SUBLINGUAL | 12 refills | Status: DC

## 2019-10-05 MED ORDER — CLOPIDOGREL BISULFATE 75 MG PO TABS: 75.0000 mg | ORAL_TABLET | Freq: Every day | ORAL | 1 refills | Status: DC

## 2019-10-05 MED ORDER — LISINOPRIL 5 MG PO TABS: 5.0000 mg | ORAL_TABLET | Freq: Every day | ORAL | 1 refills | Status: DC

## 2019-10-05 MED ORDER — TRAZODONE HCL 50 MG PO TABS: ORAL_TABLET | ORAL | 1 refills | Status: DC

## 2019-10-05 MED ORDER — PANTOPRAZOLE SODIUM 40 MG PO TBEC: 40.0000 mg | DELAYED_RELEASE_TABLET | Freq: Every day | ORAL | 1 refills | Status: DC

## 2019-10-05 MED ORDER — ATORVASTATIN CALCIUM 80 MG PO TABS: ORAL_TABLET | ORAL | 1 refills | Status: DC

## 2019-10-05 MED ORDER — MONTELUKAST SODIUM 10 MG PO TABS: 10.0000 mg | ORAL_TABLET | Freq: Every day | ORAL | 1 refills | Status: DC

## 2019-10-05 MED ORDER — PREGABALIN 200 MG PO CAPS: 200.0000 mg | ORAL_CAPSULE | Freq: Three times a day (TID) | ORAL | 5 refills | Status: DC

## 2019-10-05 MED ORDER — ASPIRIN 81 MG PO TBEC: 81.0000 mg | DELAYED_RELEASE_TABLET | Freq: Every day | ORAL | 3 refills | Status: DC

## 2019-10-05 MED ORDER — LINACLOTIDE 145 MCG PO CAPS: ORAL_CAPSULE | ORAL | 1 refills | Status: DC

## 2019-10-05 NOTE — Assessment & Plan Note (Signed)
Under good control on current regimen. Continue current regimen. Continue to monitor. Call with any concerns. Refills given. Will get labs ASAP 

## 2019-10-05 NOTE — Assessment & Plan Note (Signed)
Under good control on current regimen. Continue current regimen. Continue to monitor. Call with any concerns. Refills given.   

## 2019-10-05 NOTE — Assessment & Plan Note (Signed)
Unable to get BP today- will get her in for labs and BP ASAP. Refills given. Continue to monitor. Call with any concerns.

## 2019-10-05 NOTE — Assessment & Plan Note (Signed)
Will recheck labs ASAP. Call with any concerns.

## 2019-10-05 NOTE — Progress Notes (Signed)
There were no vitals taken for this visit.   Subjective:    Patient ID: Susan Macias, female    DOB: July 18, 1971, 48 y.o.   MRN: [id number]  HPI: Susan Macias is a 48 y.o. female  Chief Complaint  Patient presents with  . Fibromyalgia    needs a refill on Lyrica   HYPERTENSION / HYPERLIPIDEMIA Satisfied with current treatment? yes Duration of hypertension: chronic BP monitoring frequency: not checking BP medication side effects: no Past BP meds: metoprolol, lisinopril Duration of hyperlipidemia: chronic Cholesterol medication side effects: no Cholesterol supplements: none Past cholesterol medications: atorvastatin Medication compliance: excellent compliance Aspirin: yes Recent stressors: no Recurrent headaches: no Visual changes: no Palpitations: no Dyspnea: no Chest pain: no Lower extremity edema: no Dizzy/lightheaded: no   FIBROMYALGIA Pain status: stable Satisfied with current treatment?: yes Medication side effects: no Medication compliance: excellent compliance Duration: Chronic Location: widespread Quality: dull and aching Current pain level: moderate Previous pain level: moderate Aggravating factors: movement Alleviating factors: lyrica, rest, ice, heat and muscle relaxer Previous pain specialty evaluation: yes Non-narcotic analgesic meds: yes Narcotic contract:no Treatments attempted: rest, ice, heat, APAP, ibuprofen, aleve, physical therapy and HEP   Relevant past medical, surgical, family and social history reviewed and updated as indicated. Interim medical history since our last visit reviewed. Allergies and medications reviewed and updated.  Review of Systems  Constitutional: Negative.   Respiratory: Negative.   Cardiovascular: Negative.   Gastrointestinal: Negative.   Musculoskeletal: Positive for myalgias. Negative for arthralgias, back pain, gait problem, joint swelling, neck pain and neck stiffness.  Skin: Negative.   Neurological:  Negative.   Psychiatric/Behavioral: Negative.     Per HPI unless specifically indicated above     Objective:    There were no vitals taken for this visit.  Wt Readings from Last 3 Encounters:  09/15/19 223 lb 15.8 oz (101.6 kg)  04/22/19 224 lb (101.6 kg)  03/23/19 222 lb (100.7 kg)    Physical Exam Vitals signs and nursing note reviewed.  Constitutional:      General: She is not in acute distress.    Appearance: Normal appearance. She is not ill-appearing, toxic-appearing or diaphoretic.  HENT:     Head: Normocephalic and atraumatic.     Right Ear: External ear normal.     Left Ear: External ear normal.     Nose: Nose normal.     Mouth/Throat:     Mouth: Mucous membranes are moist.     Pharynx: Oropharynx is clear.  Eyes:     General: No scleral icterus.       Right eye: No discharge.        Left eye: No discharge.     Conjunctiva/sclera: Conjunctivae normal.     Pupils: Pupils are equal, round, and reactive to light.  Neck:     Musculoskeletal: Normal range of motion.  Pulmonary:     Effort: Pulmonary effort is normal. No respiratory distress.     Comments: Speaking in full sentences Musculoskeletal: Normal range of motion.  Skin:    Coloration: Skin is not jaundiced or pale.     Findings: No bruising, erythema, lesion or rash.  Neurological:     Mental Status: She is alert and oriented to person, place, and time. Mental status is at baseline.  Psychiatric:        Mood and Affect: Mood normal.        Behavior: Behavior normal.        Thought Content:  Thought content normal.        Judgment: Judgment normal.     Results for orders placed or performed during the hospital encounter of 09/15/19  Comprehensive metabolic panel  Result Value Ref Range   Sodium 137 135 - 145 mmol/L   Potassium 3.0 (L) 3.5 - 5.1 mmol/L   Chloride 105 98 - 111 mmol/L   CO2 23 22 - 32 mmol/L   Glucose, Bld 129 (H) 70 - 99 mg/dL   BUN 11 6 - 20 mg/dL   Creatinine, Ser 8.18 0.44 -  1.00 mg/dL   Calcium 8.4 (L) 8.9 - 10.3 mg/dL   Total Protein 7.2 6.5 - 8.1 g/dL   Albumin 3.7 3.5 - 5.0 g/dL   AST 21 15 - 41 U/L   ALT 24 0 - 44 U/L   Alkaline Phosphatase 95 38 - 126 U/L   Total Bilirubin 0.8 0.3 - 1.2 mg/dL   GFR calc non Af Amer >60 >60 mL/min   GFR calc Af Amer >60 >60 mL/min   Anion gap 9 5 - 15  Ethanol  Result Value Ref Range   Alcohol, Ethyl (B) 76 (H) <10 mg/dL  cbc  Result Value Ref Range   WBC 8.5 4.0 - 10.5 K/uL   RBC 4.14 3.87 - 5.11 MIL/uL   Hemoglobin 12.2 12.0 - 15.0 g/dL   HCT 29.9 37.1 - 69.6 %   MCV 89.9 80.0 - 100.0 fL   MCH 29.5 26.0 - 34.0 pg   MCHC 32.8 30.0 - 36.0 g/dL   RDW 78.9 38.1 - 01.7 %   Platelets 227 150 - 400 K/uL   nRBC 0.0 0.0 - 0.2 %  Urine Drug Screen, Qualitative  Result Value Ref Range   Tricyclic, Ur Screen POSITIVE (A) NONE DETECTED   Amphetamines, Ur Screen NONE DETECTED NONE DETECTED   MDMA (Ecstasy)Ur Screen NONE DETECTED NONE DETECTED   Cocaine Metabolite,Ur [place] NONE DETECTED NONE DETECTED   Opiate, Ur Screen NONE DETECTED NONE DETECTED   Phencyclidine (PCP) Ur S NONE DETECTED NONE DETECTED   Cannabinoid 50 Ng, Ur [place] POSITIVE (A) NONE DETECTED   Barbiturates, Ur Screen NONE DETECTED NONE DETECTED   Benzodiazepine, Ur Scrn NONE DETECTED NONE DETECTED   Methadone Scn, Ur NONE DETECTED NONE DETECTED  hCG, quantitative, pregnancy  Result Value Ref Range   hCG, Beta Chain, Quant, S 1 <5 mIU/mL  Glucose, capillary  Result Value Ref Range   Glucose-Capillary 104 (H) 70 - 99 mg/dL  Urinalysis, Complete w Microscopic  Result Value Ref Range   Color, Urine YELLOW (A) YELLOW   APPearance CLOUDY (A) CLEAR   Specific Gravity, Urine 1.024 1.005 - 1.030   pH 5.0 5.0 - 8.0   Glucose, UA NEGATIVE NEGATIVE mg/dL   Hgb urine dipstick NEGATIVE NEGATIVE   Bilirubin Urine NEGATIVE NEGATIVE   Ketones, ur NEGATIVE NEGATIVE mg/dL   Protein, ur 30 (A) NEGATIVE mg/dL   Nitrite NEGATIVE NEGATIVE   Leukocytes,Ua NEGATIVE  NEGATIVE   RBC / HPF 0-5 0 - 5 RBC/hpf   WBC, UA 0-5 0 - 5 WBC/hpf   Bacteria, UA NONE SEEN NONE SEEN   Squamous Epithelial / LPF 6-10 0 - 5   Mucus PRESENT    Hyaline Casts, UA PRESENT    Granular Casts, UA PRESENT   Salicylate level  Result Value Ref Range   Salicylate Lvl <7.0 2.8 - 30.0 mg/dL  Acetaminophen level  Result Value Ref Range   Acetaminophen (Tylenol), Serum <10 (  L) 10 - 30 ug/mL  Pregnancy, urine  Result Value Ref Range   Preg Test, Ur NEGATIVE NEGATIVE      Assessment & Plan:   Problem List Items Addressed This Visit      Cardiovascular and Mediastinum   Essential hypertension - Primary    Unable to get BP today- will get her in for labs and BP ASAP. Refills given. Continue to monitor. Call with any concerns.       Relevant Medications   nitroGLYCERIN (NITROSTAT) 0.4 MG SL tablet   lisinopril (ZESTRIL) 5 MG tablet   atorvastatin (LIPITOR) 80 MG tablet   aspirin 81 MG EC tablet   Other Relevant Orders   CBC with Differential/Platelet   Comprehensive metabolic panel   Microalbumin, Urine Waived   UA/M w/rflx Culture, Routine     Digestive   Chronic constipation    Under good control on current regimen. Continue current regimen. Continue to monitor. Call with any concerns. Refills given. Checking labs ASAP.       Relevant Orders   CBC with Differential/Platelet   Comprehensive metabolic panel   TSH   UA/M w/rflx Culture, Routine     Endocrine   IFG (impaired fasting glucose)    Will recheck labs ASAP. Call with any concerns.       Relevant Orders   CBC with Differential/Platelet   Comprehensive metabolic panel   UA/M w/rflx Culture, Routine   Bayer DCA Hb A1c Waived     Other   Fibromyalgia (Chronic)    Under good control on current regimen. Continue current regimen. Continue to monitor. Call with any concerns. Refills given.        Relevant Medications   traZODone (DESYREL) 50 MG tablet   pregabalin (LYRICA) 200 MG capsule    aspirin 81 MG EC tablet   Other Relevant Orders   CBC with Differential/Platelet   Comprehensive metabolic panel   UA/M w/rflx Culture, Routine   Mixed hyperlipidemia    Under good control on current regimen. Continue current regimen. Continue to monitor. Call with any concerns. Refills given. Will get labs ASAP.       Relevant Medications   nitroGLYCERIN (NITROSTAT) 0.4 MG SL tablet   lisinopril (ZESTRIL) 5 MG tablet   atorvastatin (LIPITOR) 80 MG tablet   aspirin 81 MG EC tablet   Other Relevant Orders   CBC with Differential/Platelet   Comprehensive metabolic panel   Lipid Panel w/o Chol/HDL Ratio   UA/M w/rflx Culture, Routine       Follow up plan: Return in about 6 months (around 04/03/2020) for Physical.   . This visit was completed via FaceTime due to the restrictions of the COVID-19 pandemic. All issues as above were discussed and addressed. Physical exam was done as above through visual confirmation on FaceTime. If it was felt that the patient should be evaluated in the office, they were directed there. The patient verbally consented to this visit. . Location of the patient: home . Location of the provider: home . Those involved with this call:  . Provider: Park Liter, DO . CMA: Tiffany Reel, CMA . Front Desk/Registration: Don Perking  . Time spent on call: 25 minutes with patient face to face via video conference. More than 50% of this time was spent in counseling and coordination of care. 40 minutes total spent in review of patient's record and preparation of their chart.

## 2019-10-05 NOTE — Assessment & Plan Note (Signed)
Under good control on current regimen. Continue current regimen. Continue to monitor. Call with any concerns. Refills given. Checking labs ASAP.

## 2019-10-14 ENCOUNTER — Other Ambulatory Visit: Payer: Medicare Other

## 2019-10-18 ENCOUNTER — Other Ambulatory Visit: Payer: Medicare Other

## 2019-10-19 ENCOUNTER — Encounter: Payer: Medicare Other | Admitting: Student in an Organized Health Care Education/Training Program

## 2019-10-19 ENCOUNTER — Other Ambulatory Visit: Payer: Medicare Other

## 2019-10-22 ENCOUNTER — Other Ambulatory Visit: Payer: Self-pay

## 2019-10-22 ENCOUNTER — Other Ambulatory Visit: Payer: Medicare Other

## 2019-10-22 DIAGNOSIS — R7301 Impaired fasting glucose: Secondary | ICD-10-CM | POA: Diagnosis not present

## 2019-10-22 DIAGNOSIS — I10 Essential (primary) hypertension: Secondary | ICD-10-CM | POA: Diagnosis not present

## 2019-10-22 DIAGNOSIS — M797 Fibromyalgia: Secondary | ICD-10-CM

## 2019-10-22 DIAGNOSIS — E782 Mixed hyperlipidemia: Secondary | ICD-10-CM

## 2019-10-22 DIAGNOSIS — K5909 Other constipation: Secondary | ICD-10-CM | POA: Diagnosis not present

## 2019-10-23 LAB — BAYER DCA HB A1C WAIVED: HB A1C (BAYER DCA - WAIVED): 6.5 % (ref <7.0)

## 2019-10-23 LAB — CBC WITH DIFFERENTIAL/PLATELET
Basophils Absolute: 0 10*3/uL (ref 0.0–0.2)
Basos: 0 %
EOS (ABSOLUTE): 0.1 10*3/uL (ref 0.0–0.4)
Eos: 2 %
Hematocrit: 40.5 % (ref 34.0–46.6)
Hemoglobin: 13.5 g/dL (ref 11.1–15.9)
Immature Grans (Abs): 0 10*3/uL (ref 0.0–0.1)
Immature Granulocytes: 0 %
Lymphocytes Absolute: 2.2 10*3/uL (ref 0.7–3.1)
Lymphs: 34 %
MCH: 29.8 pg (ref 26.6–33.0)
MCHC: 33.3 g/dL (ref 31.5–35.7)
MCV: 89 fL (ref 79–97)
Monocytes Absolute: 0.5 10*3/uL (ref 0.1–0.9)
Monocytes: 8 %
Neutrophils Absolute: 3.8 10*3/uL (ref 1.4–7.0)
Neutrophils: 56 %
Platelets: 259 10*3/uL (ref 150–450)
RBC: 4.53 x10E6/uL (ref 3.77–5.28)
RDW: 14.6 % (ref 11.7–15.4)
WBC: 6.7 10*3/uL (ref 3.4–10.8)

## 2019-10-23 LAB — COMPREHENSIVE METABOLIC PANEL
ALT: 23 IU/L (ref 0–32)
AST: 21 IU/L (ref 0–40)
Albumin/Globulin Ratio: 1.3 (ref 1.2–2.2)
Albumin: 4.4 g/dL (ref 3.8–4.8)
Alkaline Phosphatase: 140 IU/L — ABNORMAL HIGH (ref 39–117)
BUN/Creatinine Ratio: 14 (ref 9–23)
BUN: 12 mg/dL (ref 6–24)
Bilirubin Total: <0.2 mg/dL (ref 0.0–1.2)
CO2: 20 mmol/L (ref 20–29)
Calcium: 9.4 mg/dL (ref 8.7–10.2)
Chloride: 100 mmol/L (ref 96–106)
Creatinine, Ser: 0.87 mg/dL (ref 0.57–1.00)
GFR calc Af Amer: 92 mL/min/{1.73_m2} (ref >59)
GFR calc non Af Amer: 80 mL/min/{1.73_m2} (ref >59)
Globulin, Total: 3.5 g/dL (ref 1.5–4.5)
Glucose: 136 mg/dL — ABNORMAL HIGH (ref 65–99)
Potassium: 4.7 mmol/L (ref 3.5–5.2)
Sodium: 140 mmol/L (ref 134–144)
Total Protein: 7.9 g/dL (ref 6.0–8.5)

## 2019-10-23 LAB — LIPID PANEL W/O CHOL/HDL RATIO
Cholesterol, Total: 146 mg/dL (ref 100–199)
HDL: 49 mg/dL (ref >39)
LDL Chol Calc (NIH): 62 mg/dL (ref 0–99)
Triglycerides: 216 mg/dL — ABNORMAL HIGH (ref 0–149)
VLDL Cholesterol Cal: 35 mg/dL (ref 5–40)

## 2019-10-23 LAB — MICROSCOPIC EXAMINATION

## 2019-10-23 LAB — UA/M W/RFLX CULTURE, ROUTINE
Bilirubin, UA: NEGATIVE
Glucose, UA: NEGATIVE
Ketones, UA: NEGATIVE
Leukocytes,UA: NEGATIVE
Nitrite, UA: NEGATIVE
Protein,UA: NEGATIVE
Specific Gravity, UA: 1.03 (ref 1.005–1.030)
Urobilinogen, Ur: 0.2 mg/dL (ref 0.2–1.0)
pH, UA: 5 (ref 5.0–7.5)

## 2019-10-23 LAB — TSH: TSH: 1.62 u[IU]/mL (ref 0.450–4.500)

## 2019-10-23 LAB — MICROALBUMIN, URINE WAIVED
Creatinine, Urine Waived: 200 mg/dL (ref 10–300)
Microalb, Ur Waived: 30 mg/L — ABNORMAL HIGH (ref 0–19)
Microalb/Creat Ratio: <30 mg/g (ref <30)

## 2019-11-04 ENCOUNTER — Other Ambulatory Visit: Payer: Self-pay | Admitting: Nurse Practitioner

## 2019-11-04 NOTE — Telephone Encounter (Signed)
Scheduled for tomorrow.

## 2019-11-04 NOTE — Telephone Encounter (Signed)
Requested medication (s) are due for refill today: no  Requested medication (s) are on the active medication list: yes  Last refill:  07/14/2019  Future visit scheduled: yes  Notes to clinic:  Review for refill   Requested Prescriptions  Pending Prescriptions Disp Refills   nitrofurantoin, macrocrystal-monohydrate, (MACROBID) 100 MG capsule [Pharmacy Med Name: NITROFURANTOIN MONO-MCR 100 MG] 10 capsule 0    Sig: TAKE 1 CAPSULE BY MOUTH TWICE A DAY FOR 5 DAYS     Off-Protocol Failed - 11/04/2019 11:38 AM      Failed - Medication not assigned to a protocol, review manually.      Passed - Valid encounter within last 12 months    Recent Outpatient Visits          1 month ago Essential hypertension   [place], [name], DO   3 months ago Acute cystitis with hematuria   [place], Henrine Screws T, NP   6 months ago Essential hypertension   [place], [place], DO   7 months ago Tachycardia   [place], [place], DO   9 months ago [place], [place], DO      Future Appointments            In 5 months [place], [name], DO [place]            amoxicillin-clavulanate (AUGMENTIN) 500-125 MG tablet [Pharmacy Med Name: AMOX-CLAV 500-125 MG TABLET] 10 tablet 0    Sig: TAKE 1 TABLET BY MOUTH TWICE A DAY FOR 5 DAYS     Off-Protocol Failed - 11/04/2019 11:38 AM      Failed - Medication not assigned to a protocol, review manually.      Passed - Valid encounter within last 12 months    Recent Outpatient Visits          1 month ago Essential hypertension   [place], [name], DO   3 months ago Acute cystitis with hematuria   [facility] [place], Henrine Screws T, NP   6 months ago Essential hypertension   [place], [place], DO   7 months ago Tachycardia   [place],  [place], DO   9 months ago [place], [place], DO      Future Appointments            In 5 months Wynetta Emery, Barb [place], DO [employer], [facility]

## 2019-11-05 ENCOUNTER — Ambulatory Visit: Payer: Medicare Other | Admitting: Family Medicine

## 2019-11-17 ENCOUNTER — Other Ambulatory Visit: Payer: Self-pay

## 2019-11-17 ENCOUNTER — Ambulatory Visit (INDEPENDENT_AMBULATORY_CARE_PROVIDER_SITE_OTHER): Payer: Medicare Other | Admitting: Family Medicine

## 2019-11-17 ENCOUNTER — Encounter: Payer: Self-pay | Admitting: Family Medicine

## 2019-11-17 VITALS — Temp 97.5°F | Wt 231.0 lb

## 2019-11-17 DIAGNOSIS — M797 Fibromyalgia: Secondary | ICD-10-CM | POA: Diagnosis not present

## 2019-11-17 MED ORDER — PREDNISONE 20 MG PO TABS: 40.0000 mg | ORAL_TABLET | Freq: Every day | ORAL | 0 refills | Status: DC

## 2019-11-17 NOTE — Progress Notes (Signed)
Temp (!) 97.5 F (36.4 C) (Oral)   Wt 231 lb (104.8 kg)   BMI 38.44 kg/m    Subjective:    Patient ID: Susan Macias, female    DOB: Apr 08, 1971, 48 y.o.   MRN: [id number]  HPI: Susan Macias is a 48 y.o. female  Chief Complaint  Patient presents with  . Fibromyalgia    . This visit was completed via telephone due to the restrictions of the COVID-19 pandemic. All issues as above were discussed and addressed but no physical exam was performed. If it was felt that the patient should be evaluated in the office, they were directed there. The patient verbally consented to this visit. Patient was unable to complete an audio/visual visit due to Technical difficulties,Lack of internet. Due to the catastrophic nature of the COVID-19 pandemic, this visit was done through audio contact only. . Location of the patient: home . Location of the provider: home . Those involved with this call:  . Provider: Merrie Roof, PA-C . CMA: Lesle Chris, [place] . Front Desk/Registration: Jill Side  . Time spent on call: 15 minutes with patient face to face via video conference. More than 50% of this time was spent in counseling and coordination of care. 5 minutes total spent in review of patient's record and preparation of their chart. I verified patient identity using two factors (patient name and date of birth). Patient consents verbally to being seen via telemedicine visit today.   Patient presenting today for several days of inflammation and overall body aches/joint pains from her fibromyalgia. States this is consistent with her typical flares. Consistent with her medication regimen for this which includes elavil and lyrica. Has also been using topical analgesics and heating pads OTC. Allergic to muscle relaxers. Denies fevers, congestion, rashes, N/V/D, sick contacts, new medications, recent travel.   Relevant past medical, surgical, family and social history reviewed and updated as indicated.  Interim medical history since our last visit reviewed. Allergies and medications reviewed and updated.  Review of Systems  Per HPI unless specifically indicated above     Objective:    Temp (!) 97.5 F (36.4 C) (Oral)   Wt 231 lb (104.8 kg)   BMI 38.44 kg/m   Wt Readings from Last 3 Encounters:  11/17/19 231 lb (104.8 kg)  09/15/19 223 lb 15.8 oz (101.6 kg)  04/22/19 224 lb (101.6 kg)    Physical Exam  Unable to perform PE today due to patient lack of access to video technology for visit  Results for orders placed or performed in visit on 10/22/19  Microscopic Examination   BLD  Result Value Ref Range   RBC 0-2 0 - 2 /hpf   Epithelial Cells (non renal) 0-10 0 - 10 /hpf  Bayer DCA Hb A1c Waived  Result Value Ref Range   HB A1C (BAYER DCA - WAIVED) 6.5 <7.0 %  UA/M w/rflx Culture, Routine   Specimen: Blood   BLD  Result Value Ref Range   Specific Gravity, UA 1.030 1.005 - 1.030   pH, UA 5.0 5.0 - 7.5   Color, UA Yellow Yellow   Appearance Ur Clear Clear   Leukocytes,UA Negative Negative   Protein,UA Negative Negative/Trace   Glucose, UA Negative Negative   Ketones, UA Negative Negative   RBC, UA Trace (A) Negative   Bilirubin, UA Negative Negative   Urobilinogen, Ur 0.2 0.2 - 1.0 mg/dL   Nitrite, UA Negative Negative   Microscopic Examination See below:  TSH  Result Value Ref Range   TSH 1.620 0.450 - 4.500 uIU/mL  Microalbumin, Urine Waived  Result Value Ref Range   Microalb, Ur Waived 30 (H) 0 - 19 mg/L   Creatinine, Urine Waived 200 10 - 300 mg/dL   Microalb/Creat Ratio <30 <30 mg/g  Lipid Panel w/o Chol/HDL Ratio  Result Value Ref Range   Cholesterol, Total 146 100 - 199 mg/dL   Triglycerides 814 (H) 0 - 149 mg/dL   HDL 49 >48 mg/dL   VLDL Cholesterol Cal 35 5 - 40 mg/dL   LDL Chol Calc (NIH) 62 0 - 99 mg/dL  Comprehensive metabolic panel  Result Value Ref Range   Glucose 136 (H) 65 - 99 mg/dL   BUN 12 6 - 24 mg/dL   Creatinine, Ser 1.85 0.57 -  1.00 mg/dL   GFR calc non Af Amer 80 >59 mL/min/1.73   GFR calc Af Amer 92 >59 mL/min/1.73   BUN/Creatinine Ratio 14 9 - 23   Sodium 140 134 - 144 mmol/L   Potassium 4.7 3.5 - 5.2 mmol/L   Chloride 100 96 - 106 mmol/L   CO2 20 20 - 29 mmol/L   Calcium 9.4 8.7 - 10.2 mg/dL   Total Protein 7.9 6.0 - 8.5 g/dL   Albumin 4.4 3.8 - 4.8 g/dL   Globulin, Total 3.5 1.5 - 4.5 g/dL   Albumin/Globulin Ratio 1.3 1.2 - 2.2   Bilirubin Total <0.2 0.0 - 1.2 mg/dL   Alkaline Phosphatase 140 (H) 39 - 117 IU/L   AST 21 0 - 40 IU/L   ALT 23 0 - 32 IU/L  CBC with Differential/Platelet  Result Value Ref Range   WBC 6.7 3.4 - 10.8 x10E3/uL   RBC 4.53 3.77 - 5.28 x10E6/uL   Hemoglobin 13.5 11.1 - 15.9 g/dL   Hematocrit 63.1 49.7 - 46.6 %   MCV 89 79 - 97 fL   MCH 29.8 26.6 - 33.0 pg   MCHC 33.3 31.5 - 35.7 g/dL   RDW 02.6 37.8 - 58.8 %   Platelets 259 150 - 450 x10E3/uL   Neutrophils 56 Not Estab. %   Lymphs 34 Not Estab. %   Monocytes 8 Not Estab. %   Eos 2 Not Estab. %   Basos 0 Not Estab. %   Neutrophils Absolute 3.8 1.4 - 7.0 x10E3/uL   Lymphocytes Absolute 2.2 0.7 - 3.1 x10E3/uL   Monocytes Absolute 0.5 0.1 - 0.9 x10E3/uL   EOS (ABSOLUTE) 0.1 0.0 - 0.4 x10E3/uL   Basophils Absolute 0.0 0.0 - 0.2 x10E3/uL   Immature Granulocytes 0 Not Estab. %   Immature Grans (Abs) 0.0 0.0 - 0.1 x10E3/uL      Assessment & Plan:   Problem List Items Addressed This Visit      Other   Fibromyalgia - Primary (Chronic)    Will start prednisone burst and continue current regimen. Tylenol, heat pads, topical muscle rubs prn.       Relevant Medications   predniSONE (DELTASONE) 20 MG tablet       Follow up plan: Return if symptoms worsen or fail to improve.

## 2019-11-22 NOTE — Assessment & Plan Note (Signed)
Will start prednisone burst and continue current regimen. Tylenol, heat pads, topical muscle rubs prn.

## 2019-12-06 ENCOUNTER — Encounter: Payer: Self-pay | Admitting: Student in an Organized Health Care Education/Training Program

## 2019-12-07 ENCOUNTER — Ambulatory Visit (HOSPITAL_BASED_OUTPATIENT_CLINIC_OR_DEPARTMENT_OTHER): Payer: Medicare Other | Admitting: Student in an Organized Health Care Education/Training Program

## 2019-12-07 ENCOUNTER — Other Ambulatory Visit: Payer: Self-pay | Admitting: Student in an Organized Health Care Education/Training Program

## 2019-12-07 ENCOUNTER — Other Ambulatory Visit: Payer: Self-pay

## 2019-12-07 DIAGNOSIS — M797 Fibromyalgia: Secondary | ICD-10-CM

## 2019-12-07 DIAGNOSIS — G894 Chronic pain syndrome: Secondary | ICD-10-CM

## 2019-12-07 DIAGNOSIS — M7918 Myalgia, other site: Secondary | ICD-10-CM

## 2019-12-07 NOTE — Progress Notes (Signed)
I attempted to call the patient however no response. Voicemail left instructing patient to call front desk office at [phone] to reschedule appointment. -Dr Chrysten Woulfe  

## 2019-12-08 NOTE — Telephone Encounter (Signed)
She was contacted in ref to her appointment with Dr. Cherylann Ratel tomorrow. Per Dr. Cherylann Ratel if she wants to have her PCP refill her Zanaflex then he would not need to see her unless she wanted . She states that she will cancel tomorrow's appt and contact us again when restrictions are lifted and she can do an in-person visit with Dr.Lateef to discuss a procedure. Inst her to call us when she is ready.She will also caontact her PCP for Zanaflex refill.

## 2019-12-09 ENCOUNTER — Encounter: Payer: Medicare Other | Admitting: Student in an Organized Health Care Education/Training Program

## 2020-02-04 ENCOUNTER — Other Ambulatory Visit: Payer: Self-pay

## 2020-02-04 DIAGNOSIS — G894 Chronic pain syndrome: Secondary | ICD-10-CM

## 2020-02-04 DIAGNOSIS — M797 Fibromyalgia: Secondary | ICD-10-CM

## 2020-02-04 DIAGNOSIS — M7918 Myalgia, other site: Secondary | ICD-10-CM

## 2020-02-04 NOTE — Telephone Encounter (Signed)
Fax from pharmacy for refill by Dr. Laural Benes for Amitriptyline. Last wrote by Dr. Cherylann Ratel.  LOV 11/16/2020 Next Appt 04/24/2020

## 2020-02-07 ENCOUNTER — Other Ambulatory Visit: Payer: Self-pay

## 2020-02-07 DIAGNOSIS — G894 Chronic pain syndrome: Secondary | ICD-10-CM

## 2020-02-07 DIAGNOSIS — M797 Fibromyalgia: Secondary | ICD-10-CM

## 2020-02-07 DIAGNOSIS — M7918 Myalgia, other site: Secondary | ICD-10-CM

## 2020-02-07 NOTE — Telephone Encounter (Signed)
Fax from [facility] pharmacy requesting Rx refill on amytriptyline 25 mg tab

## 2020-02-08 ENCOUNTER — Ambulatory Visit
Payer: Medicare Other | Attending: Student in an Organized Health Care Education/Training Program | Admitting: Student in an Organized Health Care Education/Training Program

## 2020-02-08 ENCOUNTER — Encounter: Payer: Self-pay | Admitting: Student in an Organized Health Care Education/Training Program

## 2020-02-08 ENCOUNTER — Ambulatory Visit: Payer: Medicare Other | Admitting: Family Medicine

## 2020-02-08 ENCOUNTER — Other Ambulatory Visit: Payer: Self-pay

## 2020-02-08 VITALS — Ht 69.0 in | Wt 230.0 lb

## 2020-02-08 DIAGNOSIS — M7918 Myalgia, other site: Secondary | ICD-10-CM | POA: Diagnosis not present

## 2020-02-08 DIAGNOSIS — G8929 Other chronic pain: Secondary | ICD-10-CM

## 2020-02-08 DIAGNOSIS — M797 Fibromyalgia: Secondary | ICD-10-CM

## 2020-02-08 DIAGNOSIS — M419 Scoliosis, unspecified: Secondary | ICD-10-CM

## 2020-02-08 DIAGNOSIS — G894 Chronic pain syndrome: Secondary | ICD-10-CM | POA: Diagnosis not present

## 2020-02-08 DIAGNOSIS — R52 Pain, unspecified: Secondary | ICD-10-CM

## 2020-02-08 MED ORDER — DULOXETINE HCL 20 MG PO CPEP: ORAL_CAPSULE | ORAL | 0 refills | Status: DC
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
## 2020-02-10 ENCOUNTER — Encounter: Payer: Self-pay | Admitting: Family Medicine

## 2020-02-10 ENCOUNTER — Telehealth (INDEPENDENT_AMBULATORY_CARE_PROVIDER_SITE_OTHER): Payer: Medicare Other | Admitting: Family Medicine

## 2020-02-10 VITALS — BP 128/72 | Temp 97.1°F | Wt 230.0 lb

## 2020-02-10 DIAGNOSIS — R296 Repeated falls: Secondary | ICD-10-CM | POA: Diagnosis not present

## 2020-02-10 DIAGNOSIS — I10 Essential (primary) hypertension: Secondary | ICD-10-CM

## 2020-02-10 DIAGNOSIS — I25118 Atherosclerotic heart disease of native coronary artery with other forms of angina pectoris: Secondary | ICD-10-CM

## 2020-02-10 DIAGNOSIS — E559 Vitamin D deficiency, unspecified: Secondary | ICD-10-CM

## 2020-02-10 DIAGNOSIS — K219 Gastro-esophageal reflux disease without esophagitis: Secondary | ICD-10-CM | POA: Diagnosis not present

## 2020-02-10 DIAGNOSIS — R7301 Impaired fasting glucose: Secondary | ICD-10-CM

## 2020-02-10 DIAGNOSIS — F419 Anxiety disorder, unspecified: Secondary | ICD-10-CM

## 2020-02-10 DIAGNOSIS — M797 Fibromyalgia: Secondary | ICD-10-CM

## 2020-02-10 MED ORDER — ATORVASTATIN CALCIUM 80 MG PO TABS: ORAL_TABLET | ORAL | 1 refills | Status: DC

## 2020-02-10 MED ORDER — MONTELUKAST SODIUM 10 MG PO TABS: 10.0000 mg | ORAL_TABLET | Freq: Every day | ORAL | 1 refills | Status: DC

## 2020-02-10 MED ORDER — PANTOPRAZOLE SODIUM 40 MG PO TBEC: 40.0000 mg | DELAYED_RELEASE_TABLET | Freq: Every day | ORAL | 1 refills | Status: DC

## 2020-02-10 MED ORDER — CLOPIDOGREL BISULFATE 75 MG PO TABS: 75.0000 mg | ORAL_TABLET | Freq: Every day | ORAL | 1 refills | Status: DC

## 2020-02-10 MED ORDER — AMITRIPTYLINE HCL 75 MG PO TABS: 75.0000 mg | ORAL_TABLET | Freq: Every day | ORAL | 1 refills | Status: DC

## 2020-02-10 MED ORDER — TRAZODONE HCL 50 MG PO TABS: ORAL_TABLET | ORAL | 1 refills | Status: DC

## 2020-02-10 MED ORDER — PREGABALIN 200 MG PO CAPS: 200.0000 mg | ORAL_CAPSULE | Freq: Three times a day (TID) | ORAL | 5 refills | Status: DC

## 2020-02-10 MED ORDER — LISINOPRIL 5 MG PO TABS: 5.0000 mg | ORAL_TABLET | Freq: Every day | ORAL | 1 refills | Status: DC

## 2020-02-10 MED ORDER — VALACYCLOVIR HCL 1 G PO TABS: 1000.0000 mg | ORAL_TABLET | Freq: Two times a day (BID) | ORAL | 3 refills | Status: DC

## 2020-02-10 MED ORDER — METOPROLOL TARTRATE 50 MG PO TABS: 50.0000 mg | ORAL_TABLET | Freq: Two times a day (BID) | ORAL | 1 refills | Status: DC

## 2020-02-10 MED ORDER — LINACLOTIDE 145 MCG PO CAPS: ORAL_CAPSULE | ORAL | 1 refills | Status: DC

## 2020-02-10 NOTE — Assessment & Plan Note (Signed)
Under good control on current regimen. Continue current regimen. Continue to monitor. Call with any concerns. Refills given. Labs to be drawn ASAP.   

## 2020-02-10 NOTE — Assessment & Plan Note (Signed)
Under good control on current regimen. Continue current regimen. Continue to monitor. Call with any concerns. Refills given. Labs to be drawn shortly.  

## 2020-02-10 NOTE — Assessment & Plan Note (Signed)
Will check labs ASAP. Await results. Call with any concerns.

## 2020-02-10 NOTE — Assessment & Plan Note (Signed)
Stable. Continue to follow with pain management. Call with any concerns.  ?

## 2020-02-10 NOTE — Progress Notes (Signed)
BP 128/72   Temp (!) 97.1 F (36.2 C)   Wt 230 lb (104.3 kg)   BMI 33.97 kg/m    Subjective:    Patient ID: Susan Macias, female    DOB: 1971-09-02, 49 y.o.   MRN: [id number]  HPI: Susan Macias is a 49 y.o. female  Chief Complaint  Patient presents with  . Medication Refill    Amitriptyline    Has fallen 2x, not sure why, but feels like her balance has been off.   HYPERTENSION / HYPERLIPIDEMIA Satisfied with current treatment? yes Duration of hypertension: chronic BP monitoring frequency: not checking BP medication side effects: no Past BP meds: metoprolol and lisinopril Duration of hyperlipidemia: chronic Cholesterol medication side effects: no Cholesterol supplements: none Past cholesterol medications: atorvastatin Medication compliance: excellent compliance Aspirin: yes Recent stressors: yes Recurrent headaches: no Visual changes: no Palpitations: no Dyspnea: no Chest pain: no Lower extremity edema: no Dizzy/lightheaded: no  FIBROMYALGIA Pain status: stable Satisfied with current treatment?: yes Medication side effects: no Medication compliance: excellent compliance Duration: chronic Location: widespread Quality: aching and sore Current pain level: moderate Previous pain level: moderate Aggravating factors: stress, moving around a lot Alleviating factors: rest, ice, heat, laying, NSAIDs, APAP and muscle relaxer Previous pain specialty evaluation: yes Non-narcotic analgesic meds: yes Narcotic contract:no Treatments attempted: rest, ice, heat, APAP, ibuprofen, aleve, physical therapy and HEP   Impaired Fasting Glucose HbA1C:  Lab Results  Component Value Date   HGBA1C 6.5 10/22/2019   Duration of elevated blood sugar: chronic Polydipsia: no Polyuria: no Weight change: no Visual disturbance: no Glucose Monitoring: no    Accucheck frequency: Not Checking Diabetic Education: Not Completed Family history of diabetes: yes   Relevant past  medical, surgical, family and social history reviewed and updated as indicated. Interim medical history since our last visit reviewed. Allergies and medications reviewed and updated.  Review of Systems  Constitutional: Negative.   Respiratory: Negative.   Cardiovascular: Negative.   Musculoskeletal: Positive for back pain, myalgias, neck pain and neck stiffness. Negative for arthralgias, gait problem and joint swelling.  Skin: Negative.   Neurological: Negative.   Psychiatric/Behavioral: Negative.     Per HPI unless specifically indicated above     Objective:    BP 128/72   Temp (!) 97.1 F (36.2 C)   Wt 230 lb (104.3 kg)   BMI 33.97 kg/m   Wt Readings from Last 3 Encounters:  02/10/20 230 lb (104.3 kg)  02/07/20 230 lb (104.3 kg)  11/17/19 231 lb (104.8 kg)    Physical Exam Vitals and nursing note reviewed.  Constitutional:      General: She is not in acute distress.    Appearance: Normal appearance. She is not ill-appearing, toxic-appearing or diaphoretic.  HENT:     Head: Normocephalic and atraumatic.     Right Ear: External ear normal.     Left Ear: External ear normal.     Nose: Nose normal.     Mouth/Throat:     Mouth: Mucous membranes are moist.     Pharynx: Oropharynx is clear.  Eyes:     General: No scleral icterus.       Right eye: No discharge.        Left eye: No discharge.     Conjunctiva/sclera: Conjunctivae normal.     Pupils: Pupils are equal, round, and reactive to light.  Pulmonary:     Effort: Pulmonary effort is normal. No respiratory distress.     Comments:  Speaking in full sentences Musculoskeletal:        General: Normal range of motion.     Cervical back: Normal range of motion.  Skin:    Coloration: Skin is not jaundiced or pale.     Findings: No bruising, erythema, lesion or rash.  Neurological:     Mental Status: She is alert and oriented to person, place, and time. Mental status is at baseline.  Psychiatric:        Mood and  Affect: Mood normal.        Behavior: Behavior normal.        Thought Content: Thought content normal.        Judgment: Judgment normal.     Results for orders placed or performed in visit on 10/22/19  Microscopic Examination   BLD  Result Value Ref Range   RBC 0-2 0 - 2 /hpf   Epithelial Cells (non renal) 0-10 0 - 10 /hpf  Bayer DCA Hb A1c Waived  Result Value Ref Range   HB A1C (BAYER DCA - WAIVED) 6.5 <7.0 %  UA/M w/rflx Culture, Routine   Specimen: Blood   BLD  Result Value Ref Range   Specific Gravity, UA 1.030 1.005 - 1.030   pH, UA 5.0 5.0 - 7.5   Color, UA Yellow Yellow   Appearance Ur Clear Clear   Leukocytes,UA Negative Negative   Protein,UA Negative Negative/Trace   Glucose, UA Negative Negative   Ketones, UA Negative Negative   RBC, UA Trace (A) Negative   Bilirubin, UA Negative Negative   Urobilinogen, Ur 0.2 0.2 - 1.0 mg/dL   Nitrite, UA Negative Negative   Microscopic Examination See below:   TSH  Result Value Ref Range   TSH 1.620 0.450 - 4.500 uIU/mL  Microalbumin, Urine Waived  Result Value Ref Range   Microalb, Ur Waived 30 (H) 0 - 19 mg/L   Creatinine, Urine Waived 200 10 - 300 mg/dL   Microalb/Creat Ratio <30 <30 mg/g  Lipid Panel w/o Chol/HDL Ratio  Result Value Ref Range   Cholesterol, Total 146 100 - 199 mg/dL   Triglycerides 216 (H) 0 - 149 mg/dL   HDL 49 >39 mg/dL   VLDL Cholesterol Cal 35 5 - 40 mg/dL   LDL Chol Calc (NIH) 62 0 - 99 mg/dL  Comprehensive metabolic panel  Result Value Ref Range   Glucose 136 (H) 65 - 99 mg/dL   BUN 12 6 - 24 mg/dL   Creatinine, Ser 0.87 0.57 - 1.00 mg/dL   GFR calc non Af Amer 80 >59 mL/min/1.73   GFR calc Af Amer 92 >59 mL/min/1.73   BUN/Creatinine Ratio 14 9 - 23   Sodium 140 134 - 144 mmol/L   Potassium 4.7 3.5 - 5.2 mmol/L   Chloride 100 96 - 106 mmol/L   CO2 20 20 - 29 mmol/L   Calcium 9.4 8.7 - 10.2 mg/dL   Total Protein 7.9 6.0 - 8.5 g/dL   Albumin 4.4 3.8 - 4.8 g/dL   Globulin, Total 3.5  1.5 - 4.5 g/dL   Albumin/Globulin Ratio 1.3 1.2 - 2.2   Bilirubin Total <0.2 0.0 - 1.2 mg/dL   Alkaline Phosphatase 140 (H) 39 - 117 IU/L   AST 21 0 - 40 IU/L   ALT 23 0 - 32 IU/L  CBC with Differential/Platelet  Result Value Ref Range   WBC 6.7 3.4 - 10.8 x10E3/uL   RBC 4.53 3.77 - 5.28 x10E6/uL   Hemoglobin 13.5 11.1 -  15.9 g/dL   Hematocrit 19.1 47.8 - 46.6 %   MCV 89 79 - 97 fL   MCH 29.8 26.6 - 33.0 pg   MCHC 33.3 31.5 - 35.7 g/dL   RDW 29.5 62.1 - 30.8 %   Platelets 259 150 - 450 x10E3/uL   Neutrophils 56 Not Estab. %   Lymphs 34 Not Estab. %   Monocytes 8 Not Estab. %   Eos 2 Not Estab. %   Basos 0 Not Estab. %   Neutrophils Absolute 3.8 1.4 - 7.0 x10E3/uL   Lymphocytes Absolute 2.2 0.7 - 3.1 x10E3/uL   Monocytes Absolute 0.5 0.1 - 0.9 x10E3/uL   EOS (ABSOLUTE) 0.1 0.0 - 0.4 x10E3/uL   Basophils Absolute 0.0 0.0 - 0.2 x10E3/uL   Immature Granulocytes 0 Not Estab. %   Immature Grans (Abs) 0.0 0.0 - 0.1 x10E3/uL      Assessment & Plan:   Problem List Items Addressed This Visit      Cardiovascular and Mediastinum   Essential hypertension    Feeling well. Will get her in for BP. Continue current regimen. Continue to monitor. Call with any concerns.      Relevant Medications   lisinopril (ZESTRIL) 5 MG tablet   metoprolol tartrate (LOPRESSOR) 50 MG tablet   atorvastatin (LIPITOR) 80 MG tablet   Other Relevant Orders   Comprehensive metabolic panel   CBC with Differential/Platelet   Microalbumin, Urine Waived   TSH   CAD (coronary artery disease)    Feeling well. Will keep BP and cholesterol under good control. Continue to monitor.       Relevant Medications   lisinopril (ZESTRIL) 5 MG tablet   metoprolol tartrate (LOPRESSOR) 50 MG tablet   atorvastatin (LIPITOR) 80 MG tablet   Other Relevant Orders   Comprehensive metabolic panel   CBC with Differential/Platelet   TSH     Digestive   GERD (gastroesophageal reflux disease)    Under good control on  current regimen. Continue current regimen. Continue to monitor. Call with any concerns. Refills given. Labs to be drawn shortly.       Relevant Medications   linaclotide (LINZESS) 145 MCG CAPS capsule   pantoprazole (PROTONIX) 40 MG tablet   Other Relevant Orders   Comprehensive metabolic panel   CBC with Differential/Platelet   TSH     Endocrine   IFG (impaired fasting glucose)    Will check labs ASAP. Await results. Call with any concerns.       Relevant Orders   Comprehensive metabolic panel   CBC with Differential/Platelet   Lipid Panel w/o Chol/HDL Ratio   TSH   UA/M w/rflx Culture, Routine     Other   Fibromyalgia (Chronic)    Stable. Continue to follow with pain management. Call with any concerns.       Relevant Medications   amitriptyline (ELAVIL) 75 MG tablet   pregabalin (LYRICA) 200 MG capsule   traZODone (DESYREL) 50 MG tablet   Other Relevant Orders   Comprehensive metabolic panel   CBC with Differential/Platelet   TSH   Vitamin D deficiency    Will recheck labs and treat as needed. Call with any concerns.       Relevant Orders   Comprehensive metabolic panel   CBC with Differential/Platelet   TSH   VITAMIN D 25 Hydroxy (Vit-D Deficiency, Fractures)   Anxiety    Under good control on current regimen. Continue current regimen. Continue to monitor. Call with any concerns. Refills given. Labs  to be drawn ASAP.       Relevant Medications   amitriptyline (ELAVIL) 75 MG tablet   traZODone (DESYREL) 50 MG tablet    Other Visit Diagnoses    Recurrent falls    -  Primary   Will get her into PT. Call with any concerns. Continue to monitor.    Relevant Orders   Ambulatory referral to Physical Therapy       Follow up plan: Return in about 6 months (around 08/12/2020).   . This visit was completed via MyChart due to the restrictions of the COVID-19 pandemic. All issues as above were discussed and addressed. Physical exam was done as above through  visual confirmation on MyChart. If it was felt that the patient should be evaluated in the office, they were directed there. The patient verbally consented to this visit. . Location of the patient: home . Location of the provider: work . Those involved with this call:  . Provider: Olevia Perches, DO . CMA: Tiffany Reel, CMA . Front Desk/Registration: Adela Ports  . Time spent on call: 25 minutes with patient face to face via video conference. More than 50% of this time was spent in counseling and coordination of care. 40 minutes total spent in review of patient's record and preparation of their chart.

## 2020-02-10 NOTE — Assessment & Plan Note (Signed)
Feeling well. Will get her in for BP. Continue current regimen. Continue to monitor. Call with any concerns.

## 2020-02-10 NOTE — Assessment & Plan Note (Signed)
Will recheck labs and treat as needed. Call with any concerns.  

## 2020-02-10 NOTE — Assessment & Plan Note (Signed)
Feeling well. Will keep BP and cholesterol under good control. Continue to monitor.

## 2020-02-17 ENCOUNTER — Ambulatory Visit: Payer: Medicare Other

## 2020-03-09 ENCOUNTER — Ambulatory Visit: Payer: Medicare Other

## 2020-03-15 ENCOUNTER — Ambulatory Visit (INDEPENDENT_AMBULATORY_CARE_PROVIDER_SITE_OTHER): Payer: Medicare Other

## 2020-03-15 DIAGNOSIS — Z1231 Encounter for screening mammogram for malignant neoplasm of breast: Secondary | ICD-10-CM

## 2020-03-15 DIAGNOSIS — Z0000 Encounter for general adult medical examination without abnormal findings: Secondary | ICD-10-CM | POA: Diagnosis not present

## 2020-03-15 NOTE — Patient Instructions (Signed)
Susan Macias , Thank you for taking time to come for your Medicare Wellness Visit. I appreciate your ongoing commitment to your health goals. Please review the following plan we discussed and let me know if I can assist you in the future.   Screening recommendations/referrals: Colonoscopy: not indicated  Mammogram: Please call [phone] to schedule your mammogram.  Bone Density: not indicated  Recommended yearly ophthalmology/optometry visit for glaucoma screening and checkup Recommended yearly dental visit for hygiene and checkup  Vaccinations: Influenza vaccine: up to date  Pneumococcal vaccine: not indicated  Tdap vaccine: due now Shingles vaccine: not indicated  Covid-19: We are recommending the vaccine to everyone who has not had an allergic reaction to any of the components of the vaccine. If you have specific questions about the vaccine, please bring them up with your health care provider to discuss them.   We will likely not be getting the vaccine in the office for the first rounds of vaccinations. The way they are releasing the vaccines is going to be through the health systems (like [place], [place], [facility], [facility]), through your county health department, or through the pharmacies.   The [facility] Department is giving vaccines to those 65+ and Health Care Workers Teachers and [place] providers start 01/26/20, Essential workers start 3/10 and those with co-morbidities start 02/23/20 Call [phone] to schedule  If you are 65+ you can get a vaccine through [facility] by signing up for an appointment.  You can sign up by going to: [email or web address].  You can get more information by going to: [email or web address]  [employer] next door is giving the [employer]- you can call [phone] or stop by there to schedule.  Advanced directives: Advance directive discussed with you today.  Once this is complete please bring a copy in to our office  so we can scan it into your chart.  Conditions/risks identified: discussed chronic care management team.   Next appointment: Follow up in one year for your annual wellness visit   Preventive Care 40-64 Years, Female Preventive care refers to lifestyle choices and visits with your health care provider that can promote health and wellness. What does preventive care include?  A yearly physical exam. This is also called an annual well check.  Dental exams once or twice a year.  Routine eye exams. Ask your health care provider how often you should have your eyes checked.  Personal lifestyle choices, including:  Daily care of your teeth and gums.  Regular physical activity.  Eating a healthy diet.  Avoiding tobacco and drug use.  Limiting alcohol use.  Practicing safe sex.  Taking low-dose aspirin daily starting at age 32.  Taking vitamin and mineral supplements as recommended by your health care provider. What happens during an annual well check? The services and screenings done by your health care provider during your annual well check will depend on your age, overall health, lifestyle risk factors, and family history of disease. Counseling  Your health care provider may ask you questions about your:  Alcohol use.  Tobacco use.  Drug use.  Emotional well-being.  Home and relationship well-being.  Sexual activity.  Eating habits.  Work and work Statistician.  Method of birth control.  Menstrual cycle.  Pregnancy history. Screening  You may have the following tests or measurements:  Height, weight, and BMI.  Blood pressure.  Lipid and cholesterol levels. These may be checked every 5 years, or more frequently if you are over 61 years old.  Skin check.  Lung cancer screening. You may have this screening every year starting at age 86 if you have a 30-pack-year history of smoking and currently smoke or have quit within the past 15 years.  Fecal occult  blood test (FOBT) of the stool. You may have this test every year starting at age 39.  Flexible sigmoidoscopy or colonoscopy. You may have a sigmoidoscopy every 5 years or a colonoscopy every 10 years starting at age 85.  Hepatitis C blood test.  Hepatitis B blood test.  Sexually transmitted disease (STD) testing.  Diabetes screening. This is done by checking your blood sugar (glucose) after you have not eaten for a while (fasting). You may have this done every 1-3 years.  Mammogram. This may be done every 1-2 years. Talk to your health care provider about when you should start having regular mammograms. This may depend on whether you have a family history of breast cancer.  BRCA-related cancer screening. This may be done if you have a family history of breast, ovarian, tubal, or peritoneal cancers.  Pelvic exam and Pap test. This may be done every 3 years starting at age 22. Starting at age 27, this may be done every 5 years if you have a Pap test in combination with an HPV test.  Bone density scan. This is done to screen for osteoporosis. You may have this scan if you are at high risk for osteoporosis. Discuss your test results, treatment options, and if necessary, the need for more tests with your health care provider. Vaccines  Your health care provider may recommend certain vaccines, such as:  Influenza vaccine. This is recommended every year.  Tetanus, diphtheria, and acellular pertussis (Tdap, Td) vaccine. You may need a Td booster every 10 years.  Zoster vaccine. You may need this after age 77.  Pneumococcal 13-valent conjugate (PCV13) vaccine. You may need this if you have certain conditions and were not previously vaccinated.  Pneumococcal polysaccharide (PPSV23) vaccine. You may need one or two doses if you smoke cigarettes or if you have certain conditions. Talk to your health care provider about which screenings and vaccines you need and how often you need them. This  information is not intended to replace advice given to you by your health care provider. Make sure you discuss any questions you have with your health care provider. Document Released: 12/15/2015 Document Revised: 08/07/2016 Document Reviewed: 09/19/2015 Elsevier Interactive Patient Education  2017 [place] Prevention in the Home Falls can cause injuries. They can happen to people of all ages. There are many things you can do to make your home safe and to help prevent falls. What can I do on the outside of my home?  Regularly fix the edges of walkways and driveways and fix any cracks.  Remove anything that might make you trip as you walk through a door, such as a raised step or threshold.  Trim any bushes or trees on the path to your home.  Use bright outdoor lighting.  Clear any walking paths of anything that might make someone trip, such as rocks or tools.  Regularly check to see if handrails are loose or broken. Make sure that both sides of any steps have handrails.  Any raised decks and porches should have guardrails on the edges.  Have any leaves, snow, or ice cleared regularly.  Use sand or salt on walking paths during winter.  Clean up any spills in your garage right away. This includes  oil or grease spills. What can I do in the bathroom?  Use night lights.  Install grab bars by the toilet and in the tub and shower. Do not use towel bars as grab bars.  Use non-skid mats or decals in the tub or shower.  If you need to sit down in the shower, use a plastic, non-slip stool.  Keep the floor dry. Clean up any water that spills on the floor as soon as it happens.  Remove soap buildup in the tub or shower regularly.  Attach bath mats securely with double-sided non-slip rug tape.  Do not have throw rugs and other things on the floor that can make you trip. What can I do in the bedroom?  Use night lights.  Make sure that you have a light by your bed that  is easy to reach.  Do not use any sheets or blankets that are too big for your bed. They should not hang down onto the floor.  Have a firm chair that has side arms. You can use this for support while you get dressed.  Do not have throw rugs and other things on the floor that can make you trip. What can I do in the kitchen?  Clean up any spills right away.  Avoid walking on wet floors.  Keep items that you use a lot in easy-to-reach places.  If you need to reach something above you, use a strong step stool that has a grab bar.  Keep electrical cords out of the way.  Do not use floor polish or wax that makes floors slippery. If you must use wax, use non-skid floor wax.  Do not have throw rugs and other things on the floor that can make you trip. What can I do with my stairs?  Do not leave any items on the stairs.  Make sure that there are handrails on both sides of the stairs and use them. Fix handrails that are broken or loose. Make sure that handrails are as long as the stairways.  Check any carpeting to make sure that it is firmly attached to the stairs. Fix any carpet that is loose or worn.  Avoid having throw rugs at the top or bottom of the stairs. If you do have throw rugs, attach them to the floor with carpet tape.  Make sure that you have a light switch at the top of the stairs and the bottom of the stairs. If you do not have them, ask someone to add them for you. What else can I do to help prevent falls?  Wear shoes that:  Do not have high heels.  Have rubber bottoms.  Are comfortable and fit you well.  Are closed at the toe. Do not wear sandals.  If you use a stepladder:  Make sure that it is fully opened. Do not climb a closed stepladder.  Make sure that both sides of the stepladder are locked into place.  Ask someone to hold it for you, if possible.  Clearly mark and make sure that you can see:  Any grab bars or handrails.  First and last steps.   Where the edge of each step is.  Use tools that help you move around (mobility aids) if they are needed. These include:  Canes.  Walkers.  Scooters.  Crutches.  Turn on the lights when you go into a dark area. Replace any light bulbs as soon as they burn out.  Set up your furniture so you  have a clear path. Avoid moving your furniture around.  If any of your floors are uneven, fix them.  If there are any pets around you, be aware of where they are.  Review your medicines with your doctor. Some medicines can make you feel dizzy. This can increase your chance of falling. Ask your doctor what other things that you can do to help prevent falls. This information is not intended to replace advice given to you by your health care provider. Make sure you discuss any questions you have with your health care provider. Document Released: 09/14/2009 Document Revised: 04/25/2016 Document Reviewed: 12/23/2014 Elsevier Interactive Patient Education  2017 [employer].
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
## 2020-03-21 ENCOUNTER — Telehealth: Payer: Self-pay | Admitting: Cardiovascular Disease

## 2020-03-21 NOTE — Telephone Encounter (Signed)
  Patient Consent for Virtual Visit         Susan Macias has provided verbal consent on 03/21/2020 for a virtual visit (video or telephone).   CONSENT FOR VIRTUAL VISIT FOR:  Susan Macias  By participating in this virtual visit I agree to the following:  I hereby voluntarily request, consent and authorize [facility] and its employed or contracted physicians, physician assistants, nurse practitioners or other licensed health care professionals (the Practitioner), to provide me with telemedicine health care services (the "Services") as deemed necessary by the treating Practitioner. I acknowledge and consent to receive the Services by the Practitioner via telemedicine. I understand that the telemedicine visit will involve communicating with the Practitioner through live audiovisual communication technology and the disclosure of certain medical information by electronic transmission. I acknowledge that I have been given the opportunity to request an in-person assessment or other available alternative prior to the telemedicine visit and am voluntarily participating in the telemedicine visit.  I understand that I have the right to withhold or withdraw my consent to the use of telemedicine in the course of my care at any time, without affecting my right to future care or treatment, and that the Practitioner or I may terminate the telemedicine visit at any time. I understand that I have the right to inspect all information obtained and/or recorded in the course of the telemedicine visit and may receive copies of available information for a reasonable fee.  I understand that some of the potential risks of receiving the Services via telemedicine include:  Marland Kitchen Delay or interruption in medical evaluation due to technological equipment failure or disruption; . Information transmitted may not be sufficient (e.g. poor resolution of images) to allow for appropriate medical decision making by the Practitioner;  and/or  . In rare instances, security protocols could fail, causing a breach of personal health information.  Furthermore, I acknowledge that it is my responsibility to provide information about my medical history, conditions and care that is complete and accurate to the best of my ability. I acknowledge that Practitioner's advice, recommendations, and/or decision may be based on factors not within their control, such as incomplete or inaccurate data provided by me or distortions of diagnostic images or specimens that may result from electronic transmissions. I understand that the practice of medicine is not an exact science and that Practitioner makes no warranties or guarantees regarding treatment outcomes. I acknowledge that a copy of this consent can be made available to me via my patient portal [facility] MyChart), or I can request a printed copy by calling the office of [facility].    I understand that my insurance will be billed for this visit.   I have read or had this consent read to me. . I understand the contents of this consent, which adequately explains the benefits and risks of the Services being provided via telemedicine.  . I have been provided ample opportunity to ask questions regarding this consent and the Services and have had my questions answered to my satisfaction. . I give my informed consent for the services to be provided through the use of telemedicine in my medical care

## 2020-03-22 ENCOUNTER — Ambulatory Visit: Payer: Medicare Other | Admitting: Cardiovascular Disease

## 2020-03-29 ENCOUNTER — Telehealth: Payer: Medicare Other | Admitting: Cardiovascular Disease

## 2020-03-29 ENCOUNTER — Ambulatory Visit: Payer: Medicare Other | Admitting: Cardiovascular Disease

## 2020-03-29 ENCOUNTER — Other Ambulatory Visit: Payer: Self-pay
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
## 2020-04-06 ENCOUNTER — Ambulatory Visit: Payer: Medicare Other | Admitting: Physical Therapy

## 2020-04-12 ENCOUNTER — Ambulatory Visit: Payer: Medicare Other

## 2020-04-18 ENCOUNTER — Ambulatory Visit: Payer: Medicare Other

## 2020-04-20 ENCOUNTER — Ambulatory Visit: Payer: Medicare Other

## 2020-04-24 ENCOUNTER — Ambulatory Visit: Payer: Medicare Other | Admitting: Family Medicine
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
## 2020-04-25 ENCOUNTER — Ambulatory Visit: Payer: Medicare Other

## 2020-04-26 ENCOUNTER — Encounter: Payer: Self-pay | Admitting: Cardiovascular Disease

## 2020-04-26 ENCOUNTER — Other Ambulatory Visit: Payer: Self-pay

## 2020-04-26 ENCOUNTER — Telehealth (INDEPENDENT_AMBULATORY_CARE_PROVIDER_SITE_OTHER): Payer: Medicare Other | Admitting: Cardiovascular Disease

## 2020-04-26 VITALS — BP 120/76 | HR 90 | Ht 69.0 in | Wt 228.0 lb

## 2020-04-26 DIAGNOSIS — F17200 Nicotine dependence, unspecified, uncomplicated: Secondary | ICD-10-CM | POA: Diagnosis not present

## 2020-04-26 DIAGNOSIS — I10 Essential (primary) hypertension: Secondary | ICD-10-CM | POA: Diagnosis not present

## 2020-04-26 DIAGNOSIS — I25118 Atherosclerotic heart disease of native coronary artery with other forms of angina pectoris: Secondary | ICD-10-CM | POA: Diagnosis not present

## 2020-04-26 DIAGNOSIS — E782 Mixed hyperlipidemia: Secondary | ICD-10-CM | POA: Diagnosis not present

## 2020-04-26 DIAGNOSIS — G894 Chronic pain syndrome: Secondary | ICD-10-CM | POA: Diagnosis not present

## 2020-04-26 NOTE — Patient Instructions (Signed)
Medication Instructions:  Stop aspirin, Stay on plavix  If you need a refill on your cardiac medications before your next appointment, please call your pharmacy.    Lab work: No new labs needed   If you have labs (blood work) drawn today and your tests are completely normal, you will receive your results only by: Marland Kitchen MyChart Message (if you have MyChart) OR . A paper copy in the mail If you have any lab test that is abnormal or we need to change your treatment, we will call you to review the results.   Testing/Procedures: No new testing needed   Follow-Up: At [facility], you and your health needs are our priority.  As part of our continuing mission to provide you with exceptional heart care, we have created designated Provider Care Teams.  These Care Teams include your primary Cardiologist (physician) and Advanced Practice Providers (APPs -  Physician Assistants and Nurse Practitioners) who all work together to provide you with the care you need, when you need it.  . You will need a follow up appointment in 12 months   . Providers on your designated Care Team:   . Nicolasa Ducking, NP . Eula Listen, PA-C . Marisue Ivan, PA-C  Any Other Special Instructions Will Be Listed Below (If Applicable).  For educational health videos Log in to : [email or web address] Or : [email or web address], password : [email or web address]

## 2020-04-28 ENCOUNTER — Ambulatory Visit: Payer: Medicare Other | Admitting: Family Medicine

## 2020-05-02 ENCOUNTER — Ambulatory Visit: Payer: Medicare Other

## 2020-05-03 ENCOUNTER — Other Ambulatory Visit: Payer: Self-pay | Admitting: Student in an Organized Health Care Education/Training Program

## 2020-05-03 ENCOUNTER — Ambulatory Visit: Payer: Self-pay

## 2020-05-03 DIAGNOSIS — M797 Fibromyalgia: Secondary | ICD-10-CM

## 2020-05-03 DIAGNOSIS — G894 Chronic pain syndrome: Secondary | ICD-10-CM

## 2020-05-04 ENCOUNTER — Ambulatory Visit: Payer: Medicare Other

## 2020-05-05 ENCOUNTER — Telehealth: Payer: Self-pay

## 2020-05-08 ENCOUNTER — Ambulatory Visit: Payer: Medicare Other

## 2020-05-08 NOTE — Chronic Care Management (AMB) (Signed)
  Care Management   Follow Up Note   05/08/2020 Name: Susan Macias MRN: [id number] DOB: 07-19-1971  Referred by: [name], DO Reason for referral : Care Coordination   [name] is a 49 y.o. year old female who is a primary care patient of [name], DO. The care management team was consulted for assistance with care management and care coordination needs.    Review of patient status, including review of consultants reports, relevant laboratory and other test results, and collaboration with appropriate care team members and the patient's provider was performed as part of comprehensive patient evaluation and provision of chronic care management services.    LCSW completed CCM outreach attempt today but was unable to reach patient successfully. A HIPPA compliant voice message was left encouraging patient to return call once available. LCSW rescheduled CCM SW appointment as well.  A HIPPA compliant phone message was left for the patient providing contact information and requesting a return call.   Dickie La, BSW, MSW, LCSW [facility] Care Management [place]  [facility] [email or web address] Phone: [phone]

## 2020-05-09 ENCOUNTER — Encounter: Payer: Medicare Other | Admitting: Student in an Organized Health Care Education/Training Program

## 2020-05-10 ENCOUNTER — Ambulatory Visit: Payer: Medicare Other

## 2020-05-12 ENCOUNTER — Ambulatory Visit: Payer: Self-pay | Admitting: Physician Assistant

## 2020-05-12 ENCOUNTER — Other Ambulatory Visit: Payer: Self-pay

## 2020-05-12 DIAGNOSIS — Z113 Encounter for screening for infections with a predominantly sexual mode of transmission: Secondary | ICD-10-CM

## 2020-05-12 DIAGNOSIS — B3731 Acute candidiasis of vulva and vagina: Secondary | ICD-10-CM

## 2020-05-12 DIAGNOSIS — B373 Candidiasis of vulva and vagina: Secondary | ICD-10-CM

## 2020-05-12 LAB — WET PREP FOR TRICH, YEAST, CLUE
Trichomonas Exam: NEGATIVE
Yeast Exam: NEGATIVE

## 2020-05-12 MED ORDER — CLOTRIMAZOLE 1 % VA CREA: 1.0000 | TOPICAL_CREAM | Freq: Every day | VAGINAL | 0 refills | Status: AC

## 2020-05-12 NOTE — Progress Notes (Signed)
Wet mount reviewed, pt treated per provider order. Provider orders completed.

## 2020-05-14 ENCOUNTER — Encounter: Payer: Self-pay | Admitting: Physician Assistant
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
## 2020-05-15 ENCOUNTER — Ambulatory Visit: Payer: Medicare Other

## 2020-05-16 ENCOUNTER — Other Ambulatory Visit: Payer: Self-pay

## 2020-05-16 ENCOUNTER — Ambulatory Visit (INDEPENDENT_AMBULATORY_CARE_PROVIDER_SITE_OTHER): Payer: Medicare Other | Admitting: Family Medicine

## 2020-05-16 ENCOUNTER — Encounter: Payer: Self-pay | Admitting: Family Medicine

## 2020-05-16 VITALS — BP 106/74 | HR 82 | Temp 97.7°F | Wt 233.0 lb

## 2020-05-16 DIAGNOSIS — R300 Dysuria: Secondary | ICD-10-CM

## 2020-05-16 DIAGNOSIS — N3001 Acute cystitis with hematuria: Secondary | ICD-10-CM | POA: Diagnosis not present

## 2020-05-16 MED ORDER — NITROFURANTOIN MONOHYD MACRO 100 MG PO CAPS: 100.0000 mg | ORAL_CAPSULE | Freq: Two times a day (BID) | ORAL | 0 refills | Status: DC

## 2020-05-16 NOTE — Assessment & Plan Note (Signed)
Will treat with nitrofurantoin. Call if not getting better or getting worse. Gentle vaginal care discussed today. Call with any concerns.

## 2020-05-16 NOTE — Progress Notes (Signed)
BP 106/74   Pulse 82   Temp 97.7 F (36.5 C)   Wt 233 lb (105.7 kg)   SpO2 98%   BMI 34.41 kg/m    Subjective:    Patient ID: Susan Macias, female    DOB: 1971/11/15, 49 y.o.   MRN: [id number]  HPI: Susan Macias is a 49 y.o. female  Chief Complaint  Patient presents with  . Urinary Tract Infection   URINARY SYMPTOMS Duration: 2 weeks Dysuria: yes Urinary frequency: yes Urgency: yes Small volume voids: yes Symptom severity: moderate Urinary incontinence: no Foul odor: yes Hematuria: no Abdominal pain: no Back pain: yes Suprapubic pain/pressure: no Flank pain: no Fever:  no Vomiting: no Relief with cranberry juice: no Relief with pyridium: no Status: worse Previous urinary tract infection: yes Recurrent urinary tract infection: no Sexual activity: monogomous History of sexually transmitted disease: no Vaginal discharge: no Treatments attempted: pyridium, cranberry and increasing fluids   Relevant past medical, surgical, family and social history reviewed and updated as indicated. Interim medical history since our last visit reviewed. Allergies and medications reviewed and updated.  Review of Systems  Constitutional: Negative.   Respiratory: Negative.   Cardiovascular: Negative.   Gastrointestinal: Negative.   Genitourinary: Positive for dysuria, frequency and urgency. Negative for decreased urine volume, difficulty urinating, dyspareunia, enuresis, flank pain, genital sores, hematuria, menstrual problem, pelvic pain, vaginal bleeding, vaginal discharge and vaginal pain.  Musculoskeletal: Negative.   Psychiatric/Behavioral: Negative.     Per HPI unless specifically indicated above     Objective:    BP 106/74   Pulse 82   Temp 97.7 F (36.5 C)   Wt 233 lb (105.7 kg)   SpO2 98%   BMI 34.41 kg/m   Wt Readings from Last 3 Encounters:  05/16/20 233 lb (105.7 kg)  04/26/20 228 lb (103.4 kg)  02/10/20 230 lb (104.3 kg)    Physical  Exam Vitals and nursing note reviewed.  Constitutional:      General: She is not in acute distress.    Appearance: Normal appearance. She is not ill-appearing, toxic-appearing or diaphoretic.  HENT:     Head: Normocephalic and atraumatic.     Right Ear: External ear normal.     Left Ear: External ear normal.     Nose: Nose normal.     Mouth/Throat:     Mouth: Mucous membranes are moist.     Pharynx: Oropharynx is clear.  Eyes:     General: No scleral icterus.       Right eye: No discharge.        Left eye: No discharge.     Extraocular Movements: Extraocular movements intact.     Conjunctiva/sclera: Conjunctivae normal.     Pupils: Pupils are equal, round, and reactive to light.  Cardiovascular:     Rate and Rhythm: Normal rate and regular rhythm.     Pulses: Normal pulses.     Heart sounds: Normal heart sounds. No murmur heard.  No friction rub. No gallop.   Pulmonary:     Effort: Pulmonary effort is normal. No respiratory distress.     Breath sounds: Normal breath sounds. No stridor. No wheezing, rhonchi or rales.  Chest:     Chest wall: No tenderness.  Musculoskeletal:        General: Normal range of motion.     Cervical back: Normal range of motion and neck supple.  Skin:    General: Skin is warm and dry.  Capillary Refill: Capillary refill takes less than 2 seconds.     Coloration: Skin is not jaundiced or pale.     Findings: No bruising, erythema, lesion or rash.  Neurological:     General: No focal deficit present.     Mental Status: She is alert and oriented to person, place, and time. Mental status is at baseline.  Psychiatric:        Mood and Affect: Mood normal.        Behavior: Behavior normal.        Thought Content: Thought content normal.        Judgment: Judgment normal.     Results for orders placed or performed in visit on 05/12/20  WET PREP FOR [place], YEAST, CLUE  Result Value Ref Range   Trichomonas Exam Negative Negative   Yeast Exam  Negative Negative   Clue Cell Exam Comment: Negative      Assessment & Plan:   Problem List Items Addressed This Visit      Genitourinary   Acute cystitis with hematuria - Primary    Will treat with nitrofurantoin. Call if not getting better or getting worse. Gentle vaginal care discussed today. Call with any concerns.       Other Visit Diagnoses    Dysuria       2+ leuks and blood. Will treat and await culture.    Relevant Orders   UA/M w/rflx Culture, Routine       Follow up plan: Return if symptoms worsen or fail to improve.

## 2020-05-17 ENCOUNTER — Ambulatory Visit: Payer: Medicare Other

## 2020-05-18 LAB — MICROSCOPIC EXAMINATION: WBC, UA: >30 /hpf — AB (ref 0–5)

## 2020-05-18 LAB — UA/M W/RFLX CULTURE, ROUTINE
Bilirubin, UA: NEGATIVE
Glucose, UA: NEGATIVE
Ketones, UA: NEGATIVE
Nitrite, UA: NEGATIVE
Specific Gravity, UA: 1.025 (ref 1.005–1.030)
Urobilinogen, Ur: 0.2 mg/dL (ref 0.2–1.0)
pH, UA: 5.5 (ref 5.0–7.5)

## 2020-05-18 LAB — URINE CULTURE, REFLEX

## 2020-05-22 ENCOUNTER — Ambulatory Visit: Payer: Medicare Other

## 2020-05-23 ENCOUNTER — Other Ambulatory Visit: Payer: Self-pay | Admitting: Family Medicine

## 2020-05-23 MED ORDER — SULFAMETHOXAZOLE-TRIMETHOPRIM 800-160 MG PO TABS
1.0000 | ORAL_TABLET | Freq: Two times a day (BID) | ORAL | 0 refills | Status: DC
Start: 2020-05-23 — End: 2021-05-10

## 2020-05-24 ENCOUNTER — Ambulatory Visit: Payer: Medicare Other

## 2020-05-29 ENCOUNTER — Ambulatory Visit: Payer: Medicare Other

## 2020-05-31 ENCOUNTER — Ambulatory Visit: Payer: Medicare Other

## 2020-07-07 ENCOUNTER — Telehealth: Payer: Self-pay

## 2020-07-28 ENCOUNTER — Ambulatory Visit (INDEPENDENT_AMBULATORY_CARE_PROVIDER_SITE_OTHER): Payer: Medicare Other | Admitting: Licensed Clinical Social Worker

## 2020-07-28 DIAGNOSIS — I10 Essential (primary) hypertension: Secondary | ICD-10-CM | POA: Diagnosis not present

## 2020-07-28 DIAGNOSIS — I25118 Atherosclerotic heart disease of native coronary artery with other forms of angina pectoris: Secondary | ICD-10-CM

## 2020-07-28 DIAGNOSIS — F4310 Post-traumatic stress disorder, unspecified: Secondary | ICD-10-CM

## 2020-07-28 DIAGNOSIS — F419 Anxiety disorder, unspecified: Secondary | ICD-10-CM

## 2020-07-28 NOTE — Chronic Care Management (AMB) (Signed)
Chronic Care Management    Clinical Social Work Follow Up Note  07/28/2020 Name: Susan Macias MRN: [id number] DOB: 1971-04-08  Susan Macias is a 49 y.o. year old female who is a primary care patient of [name], DO. The CCM team was consulted for assistance with Mental Health Counseling and Resources.   Review of patient status, including review of consultants reports, other relevant assessments, and collaboration with appropriate care team members and the patient's provider was performed as part of comprehensive patient evaluation and provision of chronic care management services.    SDOH (Social Determinants of Health) assessments performed: Yes    Outpatient Encounter Medications as of 07/28/2020  Medication Sig  . amitriptyline (ELAVIL) 75 MG tablet Take 1 tablet (75 mg total) by mouth at bedtime.  Marland Kitchen atorvastatin (LIPITOR) 80 MG tablet TAKE 1 TABLET DAILY AT 6PM  . calcium-vitamin D 250-100 MG-UNIT tablet Take 1 tablet by mouth 2 (two) times daily.  . clopidogrel (PLAVIX) 75 MG tablet Take 1 tablet (75 mg total) by mouth daily with breakfast.  . [employer] ALLERGY 25 MG capsule TAKE 1 CAPSULE BY MOUTH EVERY 6 HOURS AS NEEDED  . DULoxetine (CYMBALTA) 20 MG capsule Take 1 capsule (20 mg total) by mouth daily for 30 days, THEN 2 capsules (40 mg total) daily for 30 days, THEN 3 capsules (60 mg total) daily.  Marland Kitchen linaclotide (LINZESS) 145 MCG CAPS capsule TAKE 1 CAPSULE (145 MCG TOTAL) BY MOUTH DAILY BEFORE BREAKFAST.  Marland Kitchen lisinopril (ZESTRIL) 5 MG tablet Take 1 tablet (5 mg total) by mouth daily.  . metoprolol tartrate (LOPRESSOR) 50 MG tablet Take 1 tablet (50 mg total) by mouth 2 (two) times daily.  . montelukast (SINGULAIR) 10 MG tablet Take 1 tablet (10 mg total) by mouth at bedtime.  . nitrofurantoin, macrocrystal-monohydrate, (MACROBID) 100 MG capsule Take 1 capsule (100 mg total) by mouth 2 (two) times daily.  . nitroGLYCERIN (NITROSTAT) 0.4 MG SL tablet TAKE 1 TABLET BY MOUTH  UNDER TONGUE EVERY 5 MIN AS NEEDED FOR CHEST PAIN  . pantoprazole (PROTONIX) 40 MG tablet Take 1 tablet (40 mg total) by mouth daily.  . pregabalin (LYRICA) 200 MG capsule Take 1 capsule (200 mg total) by mouth 3 (three) times daily.  Marland Kitchen sulfamethoxazole-trimethoprim (BACTRIM DS) 800-160 MG tablet Take 1 tablet by mouth 2 (two) times daily.  . traZODone (DESYREL) 50 MG tablet TAKE 1/2 TO 1 TABLETS (25-50 MG TOTAL) BY MOUTH AT BEDTIME AS NEEDED FOR SLEEP.  Marland Kitchen valACYclovir (VALTREX) 1000 MG tablet Take 1 tablet (1,000 mg total) by mouth 2 (two) times daily.   No facility-administered encounter medications on file as of 07/28/2020.     Goals Addressed    .  SW- "I need to find a new mental health provider." (pt-stated)        Current Barriers:  . Chronic Mental Health needs related to Anxiety and PTSD . Limited social support . Mental Health Concerns  . Social Isolation . Lacks knowledge of community resource: available mental health providers within her nearby area . Suicidal Ideation/Homicidal Ideation: No  Clinical Social Work Goal(s):   Marland Kitchen Over the next 120 days, patient/caregiver will work with SW to address concerns related to care coordination needs and lack of education/support/resource connection. LCSW will assist patient in gaining community resource education and additional support and resource connection as well in order to maintain health and mental health appropriately  . Over the next 120 days, patient will work with SW  bi-monthly by telephone or in person to reduce or manage symptoms related to PTSD, stress and anxiety . Over the next 120 days, patient will demonstrate improved health management independence as evidenced by implementing healthy self-care skills and positive support/resources into her daily routine to help cope with stressors and improve overall health and well-being  . Over the next 120 days, patient or caregiver will verbalize basic understanding of  depression/stress process and self health management plan as evidenced by her participation in development of long term plan of care and institution of self health management strategies  Interventions: . Patient interviewed and appropriate assessments performed: brief mental health assessment . Provided mental health counseling with regard to ongoing PTSD and anxiety symptoms. LCSW provided education on relaxation techniques such as meditation, deep breathing, massage, grounding exercsies or yoga that can activate the body's relaxation response and ease symptoms of PTSD. LCSW ask that when pt is struggling with difficult emotions and traumatic memories that she start this relaxation response process.  . Provided patient with information about available mental health support resources within her area. LCSW sent a secure email to patient on 07/28/20 with a list of these resources.  . Discussed plans with patient for ongoing care management follow up and provided patient with direct contact information for care management team . Advised patient to contact CCM LCSW directly if she has issues finding a mental health provider from the list that LCSW provided.  . Assisted patient/caregiver with obtaining information about health plan benefits . Provided education and assistance to client regarding Advanced Directives. . Provided education to patient/caregiver regarding level of care options. . Provided education to patient/caregiver about Hospice and/or Palliative Care services . Referred patient to [facility] (mental health provider) for long term follow up and therapy/counseling . Emotional/Supportive Counseling provided throughout today's session  Patient Self Care Activities:  . Attends all scheduled provider appointments . Ability for insight . Independent living . Motivation for treatment  Patient Coping Strengths:  . Supportive Relationships . Spirituality . Hopefulness . Self Advocate . Able to  Communicate Effectively  Patient Self Care Deficits:  . Lacks social connections  Initial goal documentation      Follow Up Plan: SW will follow up with patient by phone over the next 60 days  Dickie La, [employer], MSW, LCSW [facility] Care [facility] [place]  [facility] [email or web address] Phone: [phone]

## 2020-08-15 ENCOUNTER — Other Ambulatory Visit: Payer: Self-pay | Admitting: Family Medicine

## 2020-08-15 NOTE — Telephone Encounter (Signed)
Requested medication (s) are due for refill today: yes   Requested medication (s) are on the active medication list: yes   Last refill:  02/10/20 #90 5 refills   Future visit scheduled: yes  Notes to clinic:  not delegated per protocol     Requested Prescriptions  Pending Prescriptions Disp Refills   pregabalin (LYRICA) 200 MG capsule [Pharmacy Med Name: PREGABALIN 200 MG CAPSULE] 90 capsule     Sig: Take 1 capsule (200 mg total) by mouth 3 (three) times daily.      Not Delegated - Neurology:  Anticonvulsants - Controlled Failed - 08/15/2020  6:25 PM      Failed - This refill cannot be delegated      Passed - Valid encounter within last 12 months    Recent Outpatient Visits           3 months ago Acute cystitis with hematuria   [facility] [place], [name], DO   6 months ago Recurrent falls   [facility] [place], [place], DO   9 months ago Fibromyalgia   [facility] [place] Maser [place], [name]   10 months ago Essential hypertension   [facility] [place], [place], DO   1 year ago Acute cystitis with hematuria   [facility] Marjie Skiff, NP       Future Appointments             In 6 days Laural Benes, Oralia Rud, DO [employer], [facility]

## 2020-08-16 NOTE — Telephone Encounter (Signed)
Routing to provider  

## 2020-08-20 ENCOUNTER — Other Ambulatory Visit: Payer: Self-pay | Admitting: Family Medicine

## 2020-08-20 NOTE — Telephone Encounter (Signed)
Requested medications are due for refill today?  Yes  Requested medications are on active medication list? Yes  Last Refill:    Pregabalin (Lyrica)  02/10/2020  # 90 with 5 refills.  This medication cannot be delegated.   Clopidogrel (Plavix)  02/10/2020  # 90 with one refill.  Medication failed Rx refill protocol due to no labs within the past 180 days.  Last labs were performed on 10/22/2019.    Future visit scheduled?  Yes in one day.    Notes to Clinic:  Please see above.

## 2020-08-21 ENCOUNTER — Encounter: Payer: Medicare Other | Admitting: Family Medicine

## 2020-08-21 NOTE — Telephone Encounter (Signed)
Routing to provider  

## 2020-08-23 ENCOUNTER — Ambulatory Visit (INDEPENDENT_AMBULATORY_CARE_PROVIDER_SITE_OTHER): Payer: Medicare Other | Admitting: Licensed Clinical Social Worker

## 2020-08-23 DIAGNOSIS — I25118 Atherosclerotic heart disease of native coronary artery with other forms of angina pectoris: Secondary | ICD-10-CM | POA: Diagnosis not present

## 2020-08-23 DIAGNOSIS — I10 Essential (primary) hypertension: Secondary | ICD-10-CM

## 2020-08-23 DIAGNOSIS — F419 Anxiety disorder, unspecified: Secondary | ICD-10-CM

## 2020-08-23 DIAGNOSIS — E782 Mixed hyperlipidemia: Secondary | ICD-10-CM | POA: Diagnosis not present

## 2020-08-23 DIAGNOSIS — F4310 Post-traumatic stress disorder, unspecified: Secondary | ICD-10-CM

## 2020-08-23 DIAGNOSIS — M797 Fibromyalgia: Secondary | ICD-10-CM

## 2020-08-23 NOTE — Chronic Care Management (AMB) (Signed)
Chronic Care Management    Clinical Social Work Follow Up Note  08/23/2020 Name: Susan Macias MRN: [id number] DOB: [birth year]  Susan Macias is a 49 y.o. year old female who is a primary care patient of [name], DO. The CCM team was consulted for assistance with Mental Health Counseling and Resources.   Review of patient status, including review of consultants reports, other relevant assessments, and collaboration with appropriate care team members and the patient's provider was performed as part of comprehensive patient evaluation and provision of chronic care management services.    SDOH (Social Determinants of Health) assessments performed: Yes    Outpatient Encounter Medications as of 08/23/2020  Medication Sig  . amitriptyline (ELAVIL) 75 MG tablet Take 1 tablet (75 mg total) by mouth at bedtime.  Marland Kitchen atorvastatin (LIPITOR) 80 MG tablet TAKE 1 TABLET DAILY AT 6PM  . calcium-vitamin D 250-100 MG-UNIT tablet Take 1 tablet by mouth 2 (two) times daily.  . clopidogrel (PLAVIX) 75 MG tablet Take 1 tablet (75 mg total) by mouth daily with breakfast.  . [facility]S ALLERGY 25 MG capsule TAKE 1 CAPSULE BY MOUTH EVERY 6 HOURS AS NEEDED  . DULoxetine (CYMBALTA) 20 MG capsule Take 1 capsule (20 mg total) by mouth daily for 30 days, THEN 2 capsules (40 mg total) daily for 30 days, THEN 3 capsules (60 mg total) daily.  Marland Kitchen linaclotide (LINZESS) 145 MCG CAPS capsule TAKE 1 CAPSULE (145 MCG TOTAL) BY MOUTH DAILY BEFORE BREAKFAST.  Marland Kitchen lisinopril (ZESTRIL) 5 MG tablet Take 1 tablet (5 mg total) by mouth daily.  . metoprolol tartrate (LOPRESSOR) 50 MG tablet Take 1 tablet (50 mg total) by mouth 2 (two) times daily.  . montelukast (SINGULAIR) 10 MG tablet Take 1 tablet (10 mg total) by mouth at bedtime.  . nitrofurantoin, macrocrystal-monohydrate, (MACROBID) 100 MG capsule Take 1 capsule (100 mg total) by mouth 2 (two) times daily.  . nitroGLYCERIN (NITROSTAT) 0.4 MG SL tablet TAKE 1 TABLET BY MOUTH  UNDER TONGUE EVERY 5 MIN AS NEEDED FOR CHEST PAIN  . pantoprazole (PROTONIX) 40 MG tablet Take 1 tablet (40 mg total) by mouth daily.  . pregabalin (LYRICA) 200 MG capsule TAKE 1 CAPSULE (200 MG TOTAL) BY MOUTH 3 (THREE) TIMES DAILY.  Marland Kitchen sulfamethoxazole-trimethoprim (BACTRIM DS) 800-160 MG tablet Take 1 tablet by mouth 2 (two) times daily.  . traZODone (DESYREL) 50 MG tablet TAKE 1/2 TO 1 TABLETS (25-50 MG TOTAL) BY MOUTH AT BEDTIME AS NEEDED FOR SLEEP.  Marland Kitchen valACYclovir (VALTREX) 1000 MG tablet Take 1 tablet (1,000 mg total) by mouth 2 (two) times daily.   No facility-administered encounter medications on file as of 08/23/2020.     Goals Addressed    .  SW- "I need to find a new mental health provider." (pt-stated)        Current Barriers:  . Chronic Mental Health needs related to Anxiety and PTSD . Limited social support . Mental Health Concerns  . Social Isolation . Lacks knowledge of community resource: available mental health providers within her nearby area . Suicidal Ideation/Homicidal Ideation: No  Clinical Social Work Goal(s):   Marland Kitchen Over the next 120 days, patient/caregiver will work with SW to address concerns related to care coordination needs and lack of education/support/resource connection. LCSW will assist patient in gaining community resource education and additional support and resource connection as well in order to maintain health and mental health appropriately  . Over the next 120 days, patient will work with SW  bi-monthly by telephone or in person to reduce or manage symptoms related to PTSD, stress and anxiety . Over the next 120 days, patient will demonstrate improved health management independence as evidenced by implementing healthy self-care skills and positive support/resources into her daily routine to help cope with stressors and improve overall health and well-being  . Over the next 120 days, patient or caregiver will verbalize basic understanding of  depression/stress process and self health management plan as evidenced by her participation in development of long term plan of care and institution of self health management strategies  Interventions: . Patient interviewed and appropriate assessments performed: brief mental health assessment . Provided mental health counseling with regard to ongoing PTSD and anxiety symptoms. LCSW provided education on relaxation techniques such as meditation, deep breathing, massage, grounding exercsies or yoga that can activate the body's relaxation response and ease symptoms of PTSD. LCSW ask that when pt is struggling with difficult emotions and traumatic memories that she start this relaxation response process.  . Provided patient with information about available mental health support resources within her area. LCSW sent a secure email to patient on 07/28/20 with a list of these resources.  . Discussed plans with patient for ongoing care management follow up and provided patient with direct contact information for care management team . Advised patient to contact CCM LCSW directly if she has issues finding a mental health provider from the list that LCSW provided.  . Assisted patient/caregiver with obtaining information about health plan benefits . Provided education and assistance to client regarding Advanced Directives. . Provided education to patient/caregiver regarding level of care options. . Provided education to patient/caregiver about Hospice and/or Palliative Care services . Sent message to PCP regarding patient's request for a referral to [facility] (mental health provider) for long term follow up and therapy/counseling . Emotional/Supportive Counseling provided throughout today's session . Patient reports that her father is currently at [facility]. She shares that she is working hard to keep up with her medical appointments and group hers and her mothers on the same day since they see the  same doctor. She reported that she bought a Pensions consultant as well. Patient states that her daughter and her 6 kids are currently staying with her at this time. . Patient shares that she has started to experience panic attacks again. She reports that she use to have these but hasn't had one for 3 years until recently. Patient states that she is fighting to stay positive and is "turning her pain into power" through effective coping and self care . LCSW provided positive reinforcement for patient's implementation of self-care. She reports that she wants to write a book one day of her life experiences. She shares that she ordered self love work books and will start these once they arrive in the mail. Patient wants to eventually start a women's self-love support group. . Patient reports that her back pain has returned and this triggers her depression and anxiety.   Patient Self Care Activities:  . Attends all scheduled provider appointments . Ability for insight . Independent living . Motivation for treatment  Patient Coping Strengths:  . Supportive Relationships . Spirituality . Hopefulness . Self Advocate . Able to Communicate Effectively  Patient Self Care Deficits:  . Lacks social connections  Please see past updates related to this goal by clicking on the "Past Updates" button in the selected goal      Follow Up Plan: SW will follow up with patient  by phone over the next quarter  Dickie La, BSW, MSW, LCSW [facility] [place]  [facility] [email or web address] Phone: [phone]

## 2020-08-27 ENCOUNTER — Other Ambulatory Visit: Payer: Self-pay | Admitting: Family Medicine

## 2020-08-27 NOTE — Addendum Note (Signed)
Addended by: [name] on: 08/27/2020 10:50 AM   Modules accepted: Orders

## 2020-08-27 NOTE — Telephone Encounter (Signed)
Requested Prescriptions  Pending Prescriptions Disp Refills  . amitriptyline (ELAVIL) 75 MG tablet [Pharmacy Med Name: AMITRIPTYLINE HCL 75 MG TAB] 90 tablet 0    Sig: TAKE 1 TABLET BY MOUTH AT BEDTIME     Psychiatry:  Antidepressants - Heterocyclics (TCAs) Passed - 08/27/2020 12:49 AM      Passed - Completed PHQ-2 or PHQ-9 in the last 360 days.      Passed - Valid encounter within last 6 months    Recent Outpatient Visits          3 months ago Acute cystitis with hematuria   [facility] [place], [name], DO   6 months ago Recurrent falls   [facility] [place], [place], [place]   9 months ago Fibromyalgia   [facility] Ro[place] Maser [place], [name]   10 months ago Essential hypertension   [facility] [place], [place], DO   1 year ago Acute cystitis with hematuria   [facility] Marjie Skiff, NP      Future Appointments            In 1 month Johnson, Oralia Rud, DO [employer], [facility]

## 2020-08-27 NOTE — Telephone Encounter (Signed)
appt

## 2020-08-27 NOTE — Progress Notes (Signed)
Psych referral

## 2020-08-28 MED ORDER — CLOPIDOGREL BISULFATE 75 MG PO TABS
75.0000 mg | ORAL_TABLET | Freq: Every day | ORAL | 0 refills | Status: DC
Start: 2020-08-28 — End: 2020-11-22

## 2020-08-28 NOTE — Telephone Encounter (Signed)
Called patient to schedule an appt and she stated that she is schedule for a physical on 09/26/20. Patient states she only needs the plavix refill. States has 14 pills left and if she can get some more to make it to her appt. Pleas eadvise

## 2020-08-28 NOTE — Addendum Note (Signed)
Addended by: [name] on: 08/28/2020 01:42 PM   Modules accepted: Orders

## 2020-09-14 ENCOUNTER — Other Ambulatory Visit: Payer: Self-pay | Admitting: Family Medicine

## 2020-09-26 ENCOUNTER — Encounter: Payer: Self-pay | Admitting: Family Medicine

## 2020-09-28 ENCOUNTER — Other Ambulatory Visit: Payer: Self-pay | Admitting: Family Medicine

## 2020-09-28 NOTE — Telephone Encounter (Signed)
Patient's last routine visit was 02/10/20, was due 08/12/20. Routing to admin for appointment and routing to provider for possible refill.

## 2020-09-28 NOTE — Telephone Encounter (Signed)
Requested medications are due for refill today yes  Requested medications are on the active medication list yes  Last refill 9/20  Last visit June 2021  Future visit scheduled Nov 2021  Notes to clinic Not Delegated

## 2020-09-28 NOTE — Telephone Encounter (Signed)
unable to lvm to confirm apt 

## 2020-10-02 ENCOUNTER — Other Ambulatory Visit: Payer: Self-pay | Admitting: Family Medicine

## 2020-10-02 ENCOUNTER — Ambulatory Visit: Payer: Medicare Other | Admitting: Licensed Clinical Social Worker

## 2020-10-02 DIAGNOSIS — I25118 Atherosclerotic heart disease of native coronary artery with other forms of angina pectoris: Secondary | ICD-10-CM

## 2020-10-02 DIAGNOSIS — F419 Anxiety disorder, unspecified: Secondary | ICD-10-CM

## 2020-10-02 DIAGNOSIS — F4310 Post-traumatic stress disorder, unspecified: Secondary | ICD-10-CM

## 2020-10-02 DIAGNOSIS — I10 Essential (primary) hypertension: Secondary | ICD-10-CM

## 2020-10-02 DIAGNOSIS — E782 Mixed hyperlipidemia: Secondary | ICD-10-CM

## 2020-10-02 MED ORDER — PREGABALIN 200 MG PO CAPS
200.0000 mg | ORAL_CAPSULE | Freq: Three times a day (TID) | ORAL | 1 refills | Status: DC
Start: 2020-10-02 — End: 2021-01-02

## 2020-10-02 NOTE — Chronic Care Management (AMB) (Signed)
Chronic Care Management    Clinical Social Work Follow Up Note  10/02/2020 Name: Susan Macias MRN: [id number] DOB: [birth year]  Susan Macias is a 49 y.o. year old female who is a primary care patient of [name], DO. The CCM team was consulted for assistance with Mental Health Counseling and Resources.   Review of patient status, including review of consultants reports, other relevant assessments, and collaboration with appropriate care team members and the patient's provider was performed as part of comprehensive patient evaluation and provision of chronic care management services.    SDOH (Social Determinants of Health) assessments performed: Yes    Outpatient Encounter Medications as of 10/02/2020  Medication Sig  . amitriptyline (ELAVIL) 75 MG tablet TAKE 1 TABLET BY MOUTH AT BEDTIME  . atorvastatin (LIPITOR) 80 MG tablet TAKE 1 TABLET DAILY AT 6PM  . calcium-vitamin D 250-100 MG-UNIT tablet Take 1 tablet by mouth 2 (two) times daily.  . clopidogrel (PLAVIX) 75 MG tablet Take 1 tablet (75 mg total) by mouth daily with breakfast.  . [employer] ALLERGY 25 MG capsule TAKE 1 CAPSULE BY MOUTH EVERY 6 HOURS AS NEEDED  . DULoxetine (CYMBALTA) 20 MG capsule Take 1 capsule (20 mg total) by mouth daily for 30 days, THEN 2 capsules (40 mg total) daily for 30 days, THEN 3 capsules (60 mg total) daily.  Marland Kitchen linaclotide (LINZESS) 145 MCG CAPS capsule TAKE 1 CAPSULE (145 MCG TOTAL) BY MOUTH DAILY BEFORE BREAKFAST.  Marland Kitchen lisinopril (ZESTRIL) 5 MG tablet Take 1 tablet (5 mg total) by mouth daily.  . metoprolol tartrate (LOPRESSOR) 50 MG tablet Take 1 tablet (50 mg total) by mouth 2 (two) times daily.  . montelukast (SINGULAIR) 10 MG tablet Take 1 tablet (10 mg total) by mouth at bedtime.  . nitrofurantoin, macrocrystal-monohydrate, (MACROBID) 100 MG capsule Take 1 capsule (100 mg total) by mouth 2 (two) times daily.  . nitroGLYCERIN (NITROSTAT) 0.4 MG SL tablet TAKE 1 TABLET BY MOUTH UNDER TONGUE EVERY  5 MIN AS NEEDED FOR CHEST PAIN  . pantoprazole (PROTONIX) 40 MG tablet Take 1 tablet (40 mg total) by mouth daily.  . pregabalin (LYRICA) 200 MG capsule Take 1 capsule (200 mg total) by mouth 3 (three) times daily.  Marland Kitchen sulfamethoxazole-trimethoprim (BACTRIM DS) 800-160 MG tablet Take 1 tablet by mouth 2 (two) times daily.  . traZODone (DESYREL) 50 MG tablet TAKE 1/2 TO 1 TABLETS (25-50 MG TOTAL) BY MOUTH AT BEDTIME AS NEEDED FOR SLEEP.  Marland Kitchen valACYclovir (VALTREX) 1000 MG tablet Take 1 tablet (1,000 mg total) by mouth 2 (two) times daily.   No facility-administered encounter medications on file as of 10/02/2020.     Goals Addressed    .  SW- "I need to find a new mental health provider." (pt-stated)        Current Barriers:  . Chronic Mental Health needs related to Anxiety and PTSD . Limited social support . Mental Health Concerns  . Social Isolation . Lacks knowledge of community resource: available mental health providers within her nearby area . Suicidal Ideation/Homicidal Ideation: No  Clinical Social Work Goal(s):   Marland Kitchen Over the next 120 days, patient/caregiver will work with SW to address concerns related to care coordination needs and lack of education/support/resource connection. LCSW will assist patient in gaining community resource education and additional support and resource connection as well in order to maintain health and mental health appropriately  . Over the next 120 days, patient will work with SW bi-monthly by telephone  or in person to reduce or manage symptoms related to PTSD, stress and anxiety . Over the next 120 days, patient will demonstrate improved health management independence as evidenced by implementing healthy self-care skills and positive support/resources into her daily routine to help cope with stressors and improve overall health and well-being  . Over the next 120 days, patient or caregiver will verbalize basic understanding of depression/stress process and self  health management plan as evidenced by her participation in development of long term plan of care and institution of self health management strategies  Interventions: . Patient interviewed and appropriate assessments performed: brief mental health assessment . Provided mental health counseling with regard to ongoing PTSD and anxiety symptoms. LCSW provided education on relaxation techniques such as meditation, deep breathing, massage, grounding exercsies or yoga that can activate the body's relaxation response and ease symptoms of PTSD. LCSW ask that when pt is struggling with difficult emotions and traumatic memories that she start this relaxation response process.  . Provided patient with information about available mental health support resources within her area. LCSW sent a secure email to patient on 07/28/20 with a list of these resources. Patient confirmed that she received these resources successfully on 10/02/2020. Marland Kitchen Discussed plans with patient for ongoing care management follow up and provided patient with direct contact information for care management team . Advised patient to contact CCM LCSW directly if she has issues finding a mental health provider from the list that LCSW provided. . Assisted patient/caregiver with obtaining information about health plan benefits . Provided education and assistance to client regarding Advanced Directives. . Provided education to patient/caregiver regarding level of care options. . Provided education to patient/caregiver about Hospice and/or Palliative Care services . Sent message to PCP regarding patient's request for a referral to [facility] (mental health provider) for long term follow up and therapy/counseling. UPDATE- Patients that to her knowledge she has not received a call from [facility] but is still very interested in receiving therapy. LCSW provided list of agencies that she can pursue until referral can be made for [facility]. LCSW sent an additional in basket  staff message to PCP regarding referral for counseling.  . Emotional/Supportive Counseling provided throughout today's session . Patient reports that her father is currently at [facility]. She states that family had a difficult week as her father's ex girlfriend came to facility and cut off his bracelets life alert and was trying to discharge him. However, things have settled down now and ex girlfriend is banned from facility. Patient shares that she has been working hard to keep up with her own medical appointments and group hers and her mothers on the same day as they see the same doctor for primary care. She reported that she bought a planner as well to use for her mother and self. Patient states that her daughter and her 6 kids are currently staying with her at this time.  . Patient shares that she has started to experience panic attacks again. She reports that she use to have these but hasn't had one for 3 years until recently. Patient states that she is fighting to stay positive and is "turning her pain into power" through effective coping and self care . LCSW provided positive reinforcement for patient's implementation of self-care. She reports that she wants to write a book one day of her life experiences. She shares that she ordered self love work books and will start these once they arrive in the mail. Patient  wants to eventually start a women's self-love support group. . Patient reports that her back pain has returned and this triggers her depression and anxiety. Pain management coping skill education provided. . Patient shares that she is in need of a medication refill for lyrica and may contact [facility] front desk to bump her PCP appointment for earlier in order to discuss need for therapy referral to [facility] and medication refill.    Patient Self Care Activities:  . Attends all scheduled provider appointments . Ability for insight . Independent living . Motivation for  treatment  Patient Coping Strengths:  . Supportive Relationships . Spirituality . Hopefulness . Self Advocate . Able to Communicate Effectively  Patient Self Care Deficits:  . Lacks social connections  Please see past updates related to this goal by clicking on the "Past Updates" button in the selected goal       Follow Up Plan: SW will follow up with patient by phone over the next quarter  Dickie La, BSW, MSW, LCSW [facility] [place]  [facility] [email or web address] Phone: [phone]

## 2020-10-02 NOTE — Telephone Encounter (Signed)
Called pt to schedule, she had rescheduled her cpe visit until December, pt is wanting to know can she get a refill to last until appt or does she need to be seen sooner. Please advise

## 2020-10-02 NOTE — Addendum Note (Signed)
Addended by: [name] on: 10/02/2020 01:21 PM   Modules accepted: Orders

## 2020-10-03 NOTE — Telephone Encounter (Signed)
Called pt to let her know rx was sent to pharmacy no answer vm not set up

## 2020-11-06 ENCOUNTER — Other Ambulatory Visit: Payer: Self-pay | Admitting: Family Medicine

## 2020-11-07 ENCOUNTER — Encounter: Payer: Self-pay | Admitting: Family Medicine

## 2020-11-15 ENCOUNTER — Other Ambulatory Visit: Payer: Self-pay | Admitting: Family Medicine

## 2020-11-15 NOTE — Telephone Encounter (Signed)
Requested medication (s) are due for refill today: yes   Requested medication (s) are on the active medication list: yes  Last refill:  08/27/2020  Future visit scheduled: no  Notes to clinic:  Patient has canceled/no showed last 2 appt Review for refill    Requested Prescriptions  Pending Prescriptions Disp Refills   amitriptyline (ELAVIL) 75 MG tablet [Pharmacy Med Name: AMITRIPTYLINE HCL 75 MG TAB] 90 tablet 0    Sig: TAKE 1 TABLET BY MOUTH EVERYDAY AT BEDTIME      Psychiatry:  Antidepressants - Heterocyclics (TCAs) Failed - 11/15/2020  1:04 AM      Failed - Valid encounter within last 6 months    Recent Outpatient Visits           6 months ago Acute cystitis with hematuria   [facility] [place], [name], DO   9 months ago Recurrent falls   [facility] [place], [place], DO   12 months ago Fibromyalgia   [facility] [place], Salley Hews, [name]   1 year ago Essential hypertension   [facility] [place], [place], DO   1 year ago Acute cystitis with hematuria   [facility] [place], [place] T, NP                Passed - Completed PHQ-2 or PHQ-9 in the last 360 days

## 2020-11-16 NOTE — Telephone Encounter (Signed)
Voicemail not set up unable to lvm to make this f.u apt.

## 2020-11-17 NOTE — Telephone Encounter (Signed)
Voicemail not set up unable to lvm to make this f.u apt

## 2020-11-20 ENCOUNTER — Encounter: Payer: Self-pay | Admitting: Family Medicine

## 2020-11-20 NOTE — Telephone Encounter (Signed)
Voicemail not set up unable to lvm to make this f.u apt 3rd attempt made. Sent letter/

## 2020-11-20 NOTE — Telephone Encounter (Signed)
FYI

## 2020-11-22 ENCOUNTER — Other Ambulatory Visit: Payer: Self-pay | Admitting: Family Medicine

## 2020-12-06 ENCOUNTER — Telehealth: Payer: Self-pay

## 2020-12-08 ENCOUNTER — Other Ambulatory Visit: Payer: Self-pay | Admitting: Family Medicine

## 2020-12-08 NOTE — Telephone Encounter (Signed)
Requested medication (s) are due for refill today: yes   Requested medication (s) are on the active medication list: yes  Last refill:  10/02/20 #90 1 refill   Future visit scheduled: no  Notes to clinic:  not delegated per protocol     Requested Prescriptions  Pending Prescriptions Disp Refills   pregabalin (LYRICA) 200 MG capsule [Pharmacy Med Name: PREGABALIN 200 MG CAPSULE] 90 capsule 1    Sig: Take 1 capsule (200 mg total) by mouth 3 (three) times daily.      Not Delegated - Neurology:  Anticonvulsants - Controlled Failed - 12/08/2020  5:28 PM      Failed - This refill cannot be delegated      Passed - Valid encounter within last 12 months    Recent Outpatient Visits           6 months ago Acute cystitis with hematuria   [facility] [place], [name], DO   10 months ago Recurrent falls   [facility] [place], [place], DO   1 year ago Fibromyalgia   [facility] [name], [name]   1 year ago Essential hypertension   [facility] [place], [name], DO   1 year ago Acute cystitis with hematuria   [facility] Marjie Skiff, NP

## 2020-12-10 ENCOUNTER — Other Ambulatory Visit: Payer: Self-pay | Admitting: Family Medicine

## 2020-12-11 NOTE — Telephone Encounter (Signed)
Pt has cancelled upcoming appt saying she has changed provider.

## 2020-12-12 DIAGNOSIS — Z20822 Contact with and (suspected) exposure to covid-19: Secondary | ICD-10-CM | POA: Diagnosis not present

## 2020-12-14 ENCOUNTER — Ambulatory Visit: Payer: Medicare Other | Admitting: Student in an Organized Health Care Education/Training Program

## 2021-01-01 ENCOUNTER — Encounter: Payer: Medicare Other | Admitting: Family Medicine

## 2021-01-02 ENCOUNTER — Ambulatory Visit
Payer: Medicare Other | Attending: Student in an Organized Health Care Education/Training Program | Admitting: Student in an Organized Health Care Education/Training Program

## 2021-01-02 ENCOUNTER — Other Ambulatory Visit: Payer: Self-pay

## 2021-01-02 ENCOUNTER — Encounter: Payer: Self-pay | Admitting: Student in an Organized Health Care Education/Training Program

## 2021-01-02 VITALS — BP 122/81 | HR 70 | Temp 96.4°F | Resp 18 | Ht 69.0 in | Wt 250.0 lb

## 2021-01-02 DIAGNOSIS — M542 Cervicalgia: Secondary | ICD-10-CM | POA: Insufficient documentation

## 2021-01-02 DIAGNOSIS — M419 Scoliosis, unspecified: Secondary | ICD-10-CM | POA: Diagnosis not present

## 2021-01-02 DIAGNOSIS — M7918 Myalgia, other site: Secondary | ICD-10-CM | POA: Diagnosis not present

## 2021-01-02 DIAGNOSIS — Z7901 Long term (current) use of anticoagulants: Secondary | ICD-10-CM | POA: Insufficient documentation

## 2021-01-02 DIAGNOSIS — G8929 Other chronic pain: Secondary | ICD-10-CM | POA: Insufficient documentation

## 2021-01-02 DIAGNOSIS — M797 Fibromyalgia: Secondary | ICD-10-CM | POA: Diagnosis not present

## 2021-01-02 DIAGNOSIS — G894 Chronic pain syndrome: Secondary | ICD-10-CM | POA: Diagnosis not present

## 2021-01-02 DIAGNOSIS — R52 Pain, unspecified: Secondary | ICD-10-CM | POA: Insufficient documentation

## 2021-01-02 MED ORDER — TRAMADOL HCL 50 MG PO TABS: 50.0000 mg | ORAL_TABLET | Freq: Two times a day (BID) | ORAL | 0 refills | Status: AC | PRN

## 2021-01-02 MED ORDER — PREGABALIN 150 MG PO CAPS: 150.0000 mg | ORAL_CAPSULE | Freq: Three times a day (TID) | ORAL | 2 refills | Status: DC

## 2021-01-02 MED ORDER — AMITRIPTYLINE HCL 75 MG PO TABS: 75.0000 mg | ORAL_TABLET | Freq: Every day | ORAL | 2 refills | Status: DC
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
## 2021-01-02 NOTE — Progress Notes (Signed)
Safety precautions to be maintained throughout the outpatient stay will include: orient to surroundings, keep bed in low position, maintain call bell within reach at all times, provide assistance with transfer out of bed and ambulation.  

## 2021-01-02 NOTE — Patient Instructions (Signed)
Tramadol to last until 02/01/21, elavil and lyrica have been escribed to your pharmacy.

## 2021-01-09 LAB — TOXASSURE SELECT 13 (MW), URINE

## 2021-01-26 ENCOUNTER — Ambulatory Visit: Payer: Self-pay | Admitting: Licensed Clinical Social Worker

## 2021-01-26 DIAGNOSIS — I10 Essential (primary) hypertension: Secondary | ICD-10-CM

## 2021-01-26 DIAGNOSIS — I25118 Atherosclerotic heart disease of native coronary artery with other forms of angina pectoris: Secondary | ICD-10-CM

## 2021-01-26 DIAGNOSIS — E782 Mixed hyperlipidemia: Secondary | ICD-10-CM

## 2021-01-26 DIAGNOSIS — F4310 Post-traumatic stress disorder, unspecified: Secondary | ICD-10-CM

## 2021-01-26 NOTE — Chronic Care Management (AMB) (Signed)
  Care Management   Follow Up Note   01/26/2021 Name: KEALEY KEMMER MRN: [id number] DOB: 03-22-1971  Referred by: No primary care provider on file. Reason for referral : Care Coordination   MERLY HINKSON is a 50 y.o. year old female who is a primary care patient of No primary care provider on file.. The care management team was consulted for assistance with care management and care coordination needs.    Review of patient status, including review of consultants reports, relevant laboratory and other test results, and collaboration with appropriate care team members and the patient's provider was performed as part of comprehensive patient evaluation and provision of chronic care management services.    CCM LCSW completed follow up CCM outreach and successfully reached patient. Patient reports that she has changed primary care providers. CCM LCSW informed her that she will close out her goals and case at this time. Patient was very appreciative of follow up call and the social work that was provided to her since 07/28/20 and will contact CFP if ever needed.  Dickie La, BSW, MSW, LCSW [facility] [place]  [facility] [email or web address] Phone: [phone]

## 2021-01-30 ENCOUNTER — Encounter: Payer: Self-pay | Admitting: Student in an Organized Health Care Education/Training Program

## 2021-02-11 ENCOUNTER — Other Ambulatory Visit: Payer: Self-pay | Admitting: Family Medicine

## 2021-02-11 NOTE — Telephone Encounter (Signed)
Requested medication (s) are due for refill today: -  Requested medication (s) are on the active medication list: no  Last refill:  10/02/20   Future visit scheduled: no  Notes to clinic:  NT not delegated to refuse or refill this med/dose change on 01/02/21   Requested Prescriptions  Pending Prescriptions Disp Refills   pregabalin (LYRICA) 200 MG capsule [Pharmacy Med Name: PREGABALIN 200 MG CAPSULE] 90 capsule 1    Sig: Take 1 capsule (200 mg total) by mouth 3 (three) times daily.      Not Delegated - Neurology:  Anticonvulsants - Controlled Failed - 02/11/2021 12:32 AM      Failed - This refill cannot be delegated      Passed - Valid encounter within last 12 months    Recent Outpatient Visits           9 months ago Acute cystitis with hematuria   [facility] [place], [name], DO   1 year ago Recurrent falls   [facility] [place], [place], DO   1 year ago Fibromyalgia   [facility] [name], [name]   1 year ago Essential hypertension   [facility] [place], [name], DO   1 year ago Acute cystitis with hematuria   [facility] Marjie Skiff, NP       Future Appointments             In 2 months Gollan, Tollie Pizza, MD [facility], [facility]

## 2021-02-12 ENCOUNTER — Other Ambulatory Visit: Payer: Self-pay | Admitting: Family Medicine

## 2021-02-28 ENCOUNTER — Other Ambulatory Visit: Payer: Self-pay | Admitting: Family Medicine

## 2021-02-28 NOTE — Telephone Encounter (Signed)
Requested medications are due for refill today.  yes  Requested medications are on the active medications list.  yes  Last refill. 11/2020  Future visit scheduled.  no  Notes to clinic.  More than 3 months overdue.

## 2021-02-28 NOTE — Telephone Encounter (Signed)
Called pt to schedule she answered and hung up

## 2021-03-03 ENCOUNTER — Other Ambulatory Visit: Payer: Self-pay | Admitting: Family Medicine

## 2021-03-05 ENCOUNTER — Other Ambulatory Visit: Payer: Self-pay | Admitting: Family Medicine

## 2021-03-22 ENCOUNTER — Other Ambulatory Visit: Payer: Self-pay | Admitting: Family Medicine

## 2021-03-22 ENCOUNTER — Telehealth: Payer: Self-pay | Admitting: Cardiovascular Disease

## 2021-03-22 MED ORDER — CLOPIDOGREL BISULFATE 75 MG PO TABS: 75.0000 mg | ORAL_TABLET | Freq: Every day | ORAL | 0 refills | Status: DC

## 2021-03-22 NOTE — Telephone Encounter (Signed)
Requested Prescriptions   Signed Prescriptions Disp Refills   clopidogrel (PLAVIX) 75 MG tablet 90 tablet 0    Sig: Take 1 tablet (75 mg total) by mouth daily with breakfast.    Authorizing Provider: GOLLAN, TIMOTHY J    Ordering User: NEWCOMER MCCLAIN, Laderius Valbuena L    

## 2021-03-22 NOTE — Telephone Encounter (Signed)
*  STAT* If patient is at the pharmacy, call can be transferred to refill team.   1. Which medications need to be refilled? (please list name of each medication and dose if known) clopidogrel (PLAVIX) 75 MG 1 tablet daily with breakfast   2. Which pharmacy/location (including street and city if local pharmacy) is medication to be sent to? [facility] [place]  3. Do they need a 30 day or 90 day supply? 90 day

## 2021-03-22 NOTE — Telephone Encounter (Signed)
Requested medications are due for refill today.  yes  Requested medications are on the active medications list.  yes  Last refill. 12/10/2020  Future visit scheduled.   no  Notes to clinic. Pt is more than 3 months overdue for appointment.

## 2021-03-22 NOTE — Telephone Encounter (Signed)
Lvm to make this apt. 

## 2021-03-22 NOTE — Telephone Encounter (Signed)
If pt calls back ask if she is still a pt here.

## 2021-03-26 ENCOUNTER — Encounter: Payer: Self-pay | Admitting: Family Medicine

## 2021-03-26 NOTE — Telephone Encounter (Signed)
FYI Mailbox full unable to lvm to ask if she is still a pt at our office will send letter.

## 2021-03-27 ENCOUNTER — Encounter: Payer: Medicare Other | Admitting: Student in an Organized Health Care Education/Training Program

## 2021-03-27 ENCOUNTER — Other Ambulatory Visit: Payer: Self-pay | Admitting: Student in an Organized Health Care Education/Training Program

## 2021-05-03 ENCOUNTER — Ambulatory Visit: Payer: Medicare Other | Admitting: Cardiovascular Disease

## 2021-05-03 ENCOUNTER — Other Ambulatory Visit: Payer: Self-pay | Admitting: Family Medicine

## 2021-05-03 NOTE — Telephone Encounter (Signed)
Patient will need an office visit for further refills. Requested Prescriptions  Pending Prescriptions Disp Refills  . atorvastatin (LIPITOR) 80 MG tablet [Pharmacy Med Name: ATORVASTATIN 80 MG TABLET] 90 tablet 0    Sig: TAKE 1 TABLET BY MOUTH DAILY AT 6PM     Cardiovascular:  Antilipid - Statins Failed - 05/03/2021  4:00 AM      Failed - Total Cholesterol in normal range and within 360 days    Cholesterol, Total  Date Value Ref Range Status  10/22/2019 146 100 - 199 mg/dL Final         Failed - LDL in normal range and within 360 days    LDL Chol Calc (NIH)  Date Value Ref Range Status  10/22/2019 62 0 - 99 mg/dL Final         Failed - HDL in normal range and within 360 days    HDL  Date Value Ref Range Status  10/22/2019 49 >39 mg/dL Final         Failed - Triglycerides in normal range and within 360 days    Triglycerides  Date Value Ref Range Status  10/22/2019 216 (H) 0 - 149 mg/dL Final         Passed - Patient is not pregnant      Passed - Valid encounter within last 12 months    Recent Outpatient Visits          11 months ago Acute cystitis with hematuria   [facility] [place], [place], DO   1 year ago Recurrent falls   [facility] [place], [place], DO   1 year ago Fibromyalgia   [facility] [name], [name]   1 year ago Essential hypertension   [facility] [place], [place], DO   1 year ago Acute cystitis with hematuria   [facility] Marjie Skiff, NP      Future Appointments            In 2 weeks Michaelle Birks, Jacquelyn D, PA-C [facility], [facility]

## 2021-05-10 ENCOUNTER — Ambulatory Visit
Payer: Medicare Other | Attending: Student in an Organized Health Care Education/Training Program | Admitting: Student in an Organized Health Care Education/Training Program

## 2021-05-10 ENCOUNTER — Encounter: Payer: Self-pay | Admitting: Student in an Organized Health Care Education/Training Program

## 2021-05-10 ENCOUNTER — Other Ambulatory Visit: Payer: Self-pay

## 2021-05-10 VITALS — BP 125/93 | HR 88 | Temp 96.6°F | Resp 16 | Ht 69.0 in | Wt 233.0 lb

## 2021-05-10 DIAGNOSIS — M797 Fibromyalgia: Secondary | ICD-10-CM | POA: Insufficient documentation

## 2021-05-10 DIAGNOSIS — M419 Scoliosis, unspecified: Secondary | ICD-10-CM | POA: Insufficient documentation

## 2021-05-10 DIAGNOSIS — M79602 Pain in left arm: Secondary | ICD-10-CM | POA: Diagnosis present

## 2021-05-10 DIAGNOSIS — G8929 Other chronic pain: Secondary | ICD-10-CM | POA: Diagnosis present

## 2021-05-10 DIAGNOSIS — Z7901 Long term (current) use of anticoagulants: Secondary | ICD-10-CM

## 2021-05-10 DIAGNOSIS — M7918 Myalgia, other site: Secondary | ICD-10-CM | POA: Insufficient documentation

## 2021-05-10 DIAGNOSIS — M542 Cervicalgia: Secondary | ICD-10-CM | POA: Diagnosis present

## 2021-05-10 DIAGNOSIS — G894 Chronic pain syndrome: Secondary | ICD-10-CM | POA: Insufficient documentation

## 2021-05-10 DIAGNOSIS — R52 Pain, unspecified: Secondary | ICD-10-CM | POA: Insufficient documentation

## 2021-05-10 DIAGNOSIS — M79601 Pain in right arm: Secondary | ICD-10-CM | POA: Diagnosis present

## 2021-05-10 MED ORDER — PREGABALIN 150 MG PO CAPS: 150.0000 mg | ORAL_CAPSULE | Freq: Three times a day (TID) | ORAL | 2 refills | Status: DC

## 2021-05-10 MED ORDER — TRAMADOL HCL 50 MG PO TABS: 50.0000 mg | ORAL_TABLET | Freq: Two times a day (BID) | ORAL | 0 refills | Status: AC | PRN

## 2021-05-10 MED ORDER — AMITRIPTYLINE HCL 75 MG PO TABS: 75.0000 mg | ORAL_TABLET | Freq: Every day | ORAL | 2 refills | Status: DC

## 2021-05-10 NOTE — Progress Notes (Signed)
Safety precautions to be maintained throughout the outpatient stay will include: orient to surroundings, keep bed in low position, maintain call bell within reach at all times, provide assistance with transfer out of bed and ambulation.  
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
## 2021-05-18 ENCOUNTER — Other Ambulatory Visit: Payer: Self-pay

## 2021-05-18 ENCOUNTER — Ambulatory Visit (INDEPENDENT_AMBULATORY_CARE_PROVIDER_SITE_OTHER): Payer: Medicare Other | Admitting: Physician Assistant

## 2021-05-18 ENCOUNTER — Encounter: Payer: Self-pay | Admitting: Physician Assistant

## 2021-05-18 VITALS — BP 134/78 | HR 78 | Ht 69.0 in | Wt 245.0 lb

## 2021-05-18 DIAGNOSIS — Z79899 Other long term (current) drug therapy: Secondary | ICD-10-CM

## 2021-05-18 DIAGNOSIS — I2510 Atherosclerotic heart disease of native coronary artery without angina pectoris: Secondary | ICD-10-CM | POA: Diagnosis not present

## 2021-05-18 DIAGNOSIS — E781 Pure hyperglyceridemia: Secondary | ICD-10-CM

## 2021-05-18 DIAGNOSIS — Z87891 Personal history of nicotine dependence: Secondary | ICD-10-CM

## 2021-05-18 DIAGNOSIS — I10 Essential (primary) hypertension: Secondary | ICD-10-CM

## 2021-05-18 DIAGNOSIS — R9431 Abnormal electrocardiogram [ECG] [EKG]: Secondary | ICD-10-CM | POA: Diagnosis not present

## 2021-05-18 DIAGNOSIS — M797 Fibromyalgia: Secondary | ICD-10-CM

## 2021-05-18 DIAGNOSIS — E785 Hyperlipidemia, unspecified: Secondary | ICD-10-CM
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
## 2021-05-18 NOTE — Patient Instructions (Addendum)
Medication Instructions:  Your physician recommends that you continue on your current medications as directed. Please refer to the Current Medication list given to you today.  *If you need a refill on your cardiac medications before your next appointment, please call your pharmacy*   Lab Work: BMP and magnesium level   If you have labs (blood work) drawn today and your tests are completely normal, you will receive your results only by: MyChart Message (if you have MyChart) OR A paper copy in the mail If you have any lab test that is abnormal or we need to change your treatment, we will call you to review the results.   Testing/Procedures: None ordered   Follow-Up: At [facility], you and your health needs are our priority.  As part of our continuing mission to provide you with exceptional heart care, we have created designated Provider Care Teams.  These Care Teams include your primary Cardiologist (physician) and Advanced Practice Providers (APPs -  Physician Assistants and Nurse Practitioners) who all work together to provide you with the care you need, when you need it.  We recommend signing up for the patient portal called "MyChart".  Sign up information is provided on this After Visit Summary.  MyChart is used to connect with patients for Virtual Visits (Telemedicine).  Patients are able to view lab/test results, encounter notes, upcoming appointments, etc.  Non-urgent messages can be sent to your provider as well.   To learn more about what you can do with MyChart, go to [email or web address].    Your next appointment:   1 year(s)  The format for your next appointment:   In Person  Provider:   You may see Dr. Julien Nordmann or one of the following Advanced Practice Providers on your designated Care Team:   Nicolasa Ducking, NP Eula Listen, PA-C Marisue Ivan, PA-C Cadence [place], [name] Gillian Shields, NP

## 2021-05-19 LAB — BASIC METABOLIC PANEL
BUN/Creatinine Ratio: 11 (ref 9–23)
BUN: 10 mg/dL (ref 6–24)
CO2: 25 mmol/L (ref 20–29)
Calcium: 9.1 mg/dL (ref 8.7–10.2)
Chloride: 103 mmol/L (ref 96–106)
Creatinine, Ser: 0.89 mg/dL (ref 0.57–1.00)
Glucose: 99 mg/dL (ref 65–99)
Potassium: 4.3 mmol/L (ref 3.5–5.2)
Sodium: 139 mmol/L (ref 134–144)
eGFR: 79 mL/min/{1.73_m2} (ref >59)

## 2021-05-19 LAB — MAGNESIUM: Magnesium: 2 mg/dL (ref 1.6–2.3)

## 2021-05-20 ENCOUNTER — Encounter: Payer: Self-pay | Admitting: Physician Assistant

## 2021-06-01 ENCOUNTER — Other Ambulatory Visit: Payer: Self-pay | Admitting: Family Medicine

## 2021-06-01 NOTE — Telephone Encounter (Signed)
Requested medications are due for refill today yes  Requested medications are on the active medication list yes  Last refill 4/2  Last visit 03/2021  Future visit scheduled 07/23/21  Notes to clinic Has already had a curtesy refill, does have an upcoming appt 07/23/21.Marland Kitchen

## 2021-06-01 NOTE — Telephone Encounter (Signed)
She said that she had changed providers, but she now has a follow up scheduled. Can we find out if she's still seeing [place]?

## 2021-06-01 NOTE — Telephone Encounter (Signed)
Pt is scheduled 8/22

## 2021-06-01 NOTE — Telephone Encounter (Signed)
LVM asking if she is still a pt here as she had stated she no longer was a pt here as she had changed providers. Please ask if she is still a pt here.

## 2021-06-18 ENCOUNTER — Other Ambulatory Visit: Payer: Self-pay | Admitting: Cardiovascular Disease

## 2021-06-23 ENCOUNTER — Other Ambulatory Visit: Payer: Self-pay | Admitting: Student in an Organized Health Care Education/Training Program

## 2021-06-23 DIAGNOSIS — M797 Fibromyalgia: Secondary | ICD-10-CM

## 2021-06-23 DIAGNOSIS — M419 Scoliosis, unspecified: Secondary | ICD-10-CM

## 2021-06-23 DIAGNOSIS — G894 Chronic pain syndrome: Secondary | ICD-10-CM

## 2021-07-11 ENCOUNTER — Other Ambulatory Visit: Payer: Self-pay | Admitting: Family Medicine

## 2021-07-11 NOTE — Telephone Encounter (Signed)
Requested medications are due for refill today.  yes  Requested medications are on the active medications list.  yes  Last refill. 02/10/2020  Future visit scheduled.   yes  Notes to clinic.  Prescription is expired.

## 2021-07-23 ENCOUNTER — Ambulatory Visit: Payer: Medicare Other | Admitting: Family Medicine

## 2021-07-26 ENCOUNTER — Other Ambulatory Visit: Payer: Self-pay | Admitting: Family Medicine

## 2021-07-26 NOTE — Telephone Encounter (Signed)
Requested Prescriptions  Pending Prescriptions Disp Refills  . atorvastatin (LIPITOR) 80 MG tablet [Pharmacy Med Name: ATORVASTATIN 80 MG TABLET] 90 tablet 0    Sig: TAKE 1 TABLET BY MOUTH DAILY AT 6PM     Cardiovascular:  Antilipid - Statins Failed - 07/26/2021  1:20 AM      Failed - Total Cholesterol in normal range and within 360 days    Cholesterol, Total  Date Value Ref Range Status  10/22/2019 146 100 - 199 mg/dL Final         Failed - LDL in normal range and within 360 days    LDL Chol Calc (NIH)  Date Value Ref Range Status  10/22/2019 62 0 - 99 mg/dL Final         Failed - HDL in normal range and within 360 days    HDL  Date Value Ref Range Status  10/22/2019 49 >39 mg/dL Final         Failed - Triglycerides in normal range and within 360 days    Triglycerides  Date Value Ref Range Status  10/22/2019 216 (H) 0 - 149 mg/dL Final         Failed - Valid encounter within last 12 months    Recent Outpatient Visits          1 year ago Acute cystitis with hematuria   [facility] [place], [name], DO   1 year ago Recurrent falls   [facility] [place], [place], DO   1 year ago Fibromyalgia   [facility] [name], [name]   1 year ago Essential hypertension   [facility] [place], [place], DO   2 years ago Acute cystitis with hematuria   [facility] Marjie Skiff, NP      Future Appointments            In 3 weeks Laural Benes, Oralia Rud, DO [facility]           Passed - Patient is not pregnant

## 2021-07-31 ENCOUNTER — Ambulatory Visit: Payer: Medicare Other | Admitting: Family Medicine

## 2021-08-04 ENCOUNTER — Other Ambulatory Visit: Payer: Self-pay | Admitting: Family Medicine

## 2021-08-04 NOTE — Telephone Encounter (Signed)
Requested Prescriptions  Pending Prescriptions Disp Refills  . metoprolol tartrate (LOPRESSOR) 50 MG tablet [Pharmacy Med Name: METOPROLOL TARTRATE 50 MG TAB] 20 tablet 0    Sig: TAKE 1 TABLET BY MOUTH TWICE A DAY     Cardiovascular:  Beta Blockers Failed - 08/04/2021 10:32 AM      Failed - Valid encounter within last 6 months    Recent Outpatient Visits          1 year ago Acute cystitis with hematuria   [facility] [place], [name], DO   1 year ago Recurrent falls   [facility] [place], [place], DO   1 year ago Fibromyalgia   [facility] [name], [name]   1 year ago Essential hypertension   [facility] [place], [place], DO   2 years ago Acute cystitis with hematuria   [facility] [place], Corrie Dandy T, NP      Future Appointments            In 2 weeks [place], [name], DO [facility]           Passed - Last BP in normal range    BP Readings from Last 1 Encounters:  05/18/21 134/78         Passed - Last Heart Rate in normal range    Pulse Readings from Last 1 Encounters:  05/18/21 78

## 2021-08-21 ENCOUNTER — Encounter: Payer: Self-pay | Admitting: Family Medicine

## 2021-08-21 ENCOUNTER — Telehealth (INDEPENDENT_AMBULATORY_CARE_PROVIDER_SITE_OTHER): Payer: Medicare Other | Admitting: Family Medicine

## 2021-08-21 VITALS — BP 138/70 | HR 99 | Temp 97.1°F | Wt 249.0 lb

## 2021-08-21 DIAGNOSIS — J069 Acute upper respiratory infection, unspecified: Secondary | ICD-10-CM | POA: Diagnosis not present

## 2021-08-21 MED ORDER — PREDNISONE 50 MG PO TABS: 50.0000 mg | ORAL_TABLET | Freq: Every day | ORAL | 0 refills | Status: DC

## 2021-08-21 NOTE — Progress Notes (Signed)
BP 138/70 (BP Location: Left Arm, Patient Position: Sitting)   Pulse 99   Temp (!) 97.1 F (36.2 C) (Oral)   Wt 249 lb (112.9 kg)   SpO2 100%   BMI 36.77 kg/m    Subjective:    Patient ID: Susan Macias, female    DOB: 02-25-1971, 50 y.o.   MRN: [id number]  HPI: Susan Macias is a 50 y.o. female  Chief Complaint  Patient presents with   URI    Dry cough, body ache, runny nose and head cold x 3 days. Patient reports taking OTC Tylenol cold and reports good symptom control. Patient using vapor rub and humidifier. Patient reports doing home COVID test last week and reports it was negative.    UPPER RESPIRATORY TRACT INFECTION Duration: 3-4 days Worst symptom: cough and congestion Fever: no Cough: yes Shortness of breath: no Wheezing: no Chest pain: no Chest tightness: no Chest congestion: no Nasal congestion: yes Runny nose: yes Post nasal drip: yes Sneezing: no Sore throat: no Swollen glands: no Sinus pressure: yes Headache: yes Face pain: no Toothache: no Ear pain: no  Ear pressure: no  Eyes red/itching:no Eye drainage/crusting: no  Vomiting: no Rash: no Fatigue: no Sick contacts: yes Strep contacts: no  Context: better Recurrent sinusitis: no Relief with OTC cold/cough medications: no  Treatments attempted: cold/sinus, mucinex, and anti-histamine   Relevant past medical, surgical, family and social history reviewed and updated as indicated. Interim medical history since our last visit reviewed. Allergies and medications reviewed and updated.  Review of Systems  Constitutional:  Positive for fatigue. Negative for activity change, appetite change, chills, diaphoresis, fever and unexpected weight change.  HENT:  Positive for congestion, postnasal drip, rhinorrhea, sinus pressure and sneezing. Negative for dental problem, drooling, ear discharge, ear pain, facial swelling, hearing loss, mouth sores, nosebleeds, sinus pain, sore throat, tinnitus, trouble  swallowing and voice change.   Eyes: Negative.   Respiratory:  Positive for cough. Negative for apnea, choking, chest tightness, shortness of breath, wheezing and stridor.   Cardiovascular: Negative.   Gastrointestinal: Negative.   Genitourinary: Negative.   Psychiatric/Behavioral: Negative.     Per HPI unless specifically indicated above     Objective:    BP 138/70 (BP Location: Left Arm, Patient Position: Sitting)   Pulse 99   Temp (!) 97.1 F (36.2 C) (Oral)   Wt 249 lb (112.9 kg)   SpO2 100%   BMI 36.77 kg/m   Wt Readings from Last 3 Encounters:  08/21/21 249 lb (112.9 kg)  05/18/21 245 lb (111.1 kg)  05/10/21 233 lb (105.7 kg)    Physical Exam Vitals and nursing note reviewed.  Constitutional:      General: She is not in acute distress.    Appearance: Normal appearance. She is not ill-appearing, toxic-appearing or diaphoretic.  HENT:     Head: Normocephalic and atraumatic.     Right Ear: External ear normal.     Left Ear: External ear normal.     Nose: Nose normal.     Mouth/Throat:     Mouth: Mucous membranes are moist.     Pharynx: Oropharynx is clear.  Eyes:     General: No scleral icterus.       Right eye: No discharge.        Left eye: No discharge.     Conjunctiva/sclera: Conjunctivae normal.     Pupils: Pupils are equal, round, and reactive to light.  Pulmonary:     Effort:  Pulmonary effort is normal. No respiratory distress.     Comments: Speaking in full sentences Musculoskeletal:        General: Normal range of motion.     Cervical back: Normal range of motion.  Skin:    Coloration: Skin is not jaundiced or pale.     Findings: No bruising, erythema, lesion or rash.  Neurological:     Mental Status: She is alert and oriented to person, place, and time. Mental status is at baseline.  Psychiatric:        Mood and Affect: Mood normal.        Behavior: Behavior normal.        Thought Content: Thought content normal.        Judgment: Judgment  normal.    Results for orders placed or performed in visit on 53/00/51  Basic Metabolic Panel (BMET)  Result Value Ref Range   Glucose 99 65 - 99 mg/dL   BUN 10 6 - 24 mg/dL   Creatinine, Ser 0.89 0.57 - 1.00 mg/dL   eGFR 79 >59 mL/min/1.73   BUN/Creatinine Ratio 11 9 - 23   Sodium 139 134 - 144 mmol/L   Potassium 4.3 3.5 - 5.2 mmol/L   Chloride 103 96 - 106 mmol/L   CO2 25 20 - 29 mmol/L   Calcium 9.1 8.7 - 10.2 mg/dL  Magnesium  Result Value Ref Range   Magnesium 2.0 1.6 - 2.3 mg/dL      Assessment & Plan:   Problem List Items Addressed This Visit   None Visit Diagnoses     Upper respiratory tract infection, unspecified type    -  Primary   Will check for COVID. Treat with prednisone. Call if not getting better or getting worse.   Relevant Orders   Novel Coronavirus, NAA ([facility])        Follow up plan: Return if symptoms worsen or fail to improve.    This visit was completed via video visit through MyChart due to the restrictions of the COVID-19 pandemic. All issues as above were discussed and addressed. Physical exam was done as above through visual confirmation on video through MyChart. If it was felt that the patient should be evaluated in the office, they were directed there. The patient verbally consented to this visit. Location of the patient: home Location of the provider: work Those involved with this call:  Provider: Park Liter, DO CMA: Lynford Humphrey, CMA Front Desk/Registration: Barth Kirks  Time spent on call:  15 minutes with patient face to face via video conference. More than 50% of this time was spent in counseling and coordination of care. 23 minutes total spent in review of patient's record and preparation of their chart.

## 2021-09-01 ENCOUNTER — Other Ambulatory Visit: Payer: Self-pay | Admitting: Family Medicine

## 2021-09-01 NOTE — Telephone Encounter (Signed)
Requested medications are due for refill today yes  Requested medications are on the active medication list yes  Last refill 9/3 (for partial fill)  Last visit 08/21/21 seen for COVID symptoms  Future visit scheduled no  Notes to clinic had a curtesy refill and was seen virtually, but only for COVID symptoms, unsure if that is the visit Dr. Laural Benes wanted prior to refill. Please assess.

## 2021-09-11 ENCOUNTER — Ambulatory Visit: Payer: Medicare Other | Admitting: Family Medicine

## 2021-09-12 ENCOUNTER — Other Ambulatory Visit: Payer: Self-pay | Admitting: Student in an Organized Health Care Education/Training Program

## 2021-09-12 DIAGNOSIS — G894 Chronic pain syndrome: Secondary | ICD-10-CM

## 2021-09-14 ENCOUNTER — Other Ambulatory Visit: Payer: Self-pay | Admitting: Student in an Organized Health Care Education/Training Program

## 2021-09-14 DIAGNOSIS — G894 Chronic pain syndrome: Secondary | ICD-10-CM

## 2021-09-28 ENCOUNTER — Telehealth: Payer: Self-pay | Admitting: Family Medicine

## 2021-09-28 NOTE — Telephone Encounter (Signed)
Copied from CRM [id number]. Topic: Medicare AWV >> Sep 28, 2021  1:23 PM Leigh Aurora wrote: Reason for CRM:  Left message for patient to call back and schedule the Medicare Annual Wellness Visit (AWV) virtually or by telephone.  Last AWV 03/15/20  Please schedule at anytime with CFP-Nurse Health Advisor.  45 minute appointment  Any questions, please call me at [phone]

## 2021-09-29 ENCOUNTER — Other Ambulatory Visit: Payer: Self-pay | Admitting: Family Medicine

## 2021-09-29 NOTE — Telephone Encounter (Signed)
Requested medication (s) are due for refill today: yes  Requested medication (s) are on the active medication list: yes  Last refill:  09/03/21 #60  Future visit scheduled: yes  Notes to clinic:  note with last refill:   NEEDS IN PERSON APPOINTMENT WITH LABS FOR MORE REFILLS.   Requested Prescriptions  Pending Prescriptions Disp Refills   metoprolol tartrate (LOPRESSOR) 50 MG tablet [Pharmacy Med Name: METOPROLOL TARTRATE 50 MG TAB] 60 tablet 0    Sig: TAKE 1 TABLET BY MOUTH 2 (TWO) TIMES DAILY. NEEDS IN PERSON APPOINTMENT WITH LABS FOR MORE REFILLS.     Cardiovascular:  Beta Blockers Passed - 09/29/2021 11:34 AM      Passed - Last BP in normal range    BP Readings from Last 1 Encounters:  08/21/21 138/70          Passed - Last Heart Rate in normal range    Pulse Readings from Last 1 Encounters:  08/21/21 99          Passed - Valid encounter within last 6 months    Recent Outpatient Visits           1 month ago Upper respiratory tract infection, unspecified type   [facility] [place], [name], DO   1 year ago Acute cystitis with hematuria   [facility] [place], [place], DO   1 year ago Recurrent falls   [facility] [place], [place], DO   1 year ago Fibromyalgia   [facility] [name], [name]   1 year ago Essential hypertension   [facility] [place], [place], DO       Future Appointments             In 1 month Johnson, Oralia Rud, DO [facility]

## 2021-10-01 NOTE — Telephone Encounter (Signed)
Needs in person appt before December. Then I can get her enough medicine to make it to that appt

## 2021-10-16 ENCOUNTER — Ambulatory Visit: Payer: Medicare Other | Admitting: Family Medicine

## 2021-10-17 ENCOUNTER — Other Ambulatory Visit: Payer: Self-pay | Admitting: Family Medicine

## 2021-10-17 NOTE — Telephone Encounter (Signed)
Appointment 11/20/21. Requested Prescriptions  Pending Prescriptions Disp Refills  . metoprolol tartrate (LOPRESSOR) 50 MG tablet [Pharmacy Med Name: METOPROLOL TARTRATE 50 MG TAB] 30 tablet 0    Sig: TAKE 1 TABLET BY MOUTH 2 (TWO) TIMES DAILY. NEEDS IN PERSON APPOINTMENT WITH LABS FOR MORE REFILLS.     Cardiovascular:  Beta Blockers Passed - 10/17/2021  8:31 AM      Passed - Last BP in normal range    BP Readings from Last 1 Encounters:  08/21/21 138/70         Passed - Last Heart Rate in normal range    Pulse Readings from Last 1 Encounters:  08/21/21 99         Passed - Valid encounter within last 6 months    Recent Outpatient Visits          1 month ago Upper respiratory tract infection, unspecified type   [facility] [place], [name], DO   1 year ago Acute cystitis with hematuria   [facility] [place], Gray, DO   1 year ago Recurrent falls   [facility] [place], [place], DO   1 year ago Fibromyalgia   [facility] [name], [name]   2 years ago Essential hypertension   [facility] [place], [place], DO      Future Appointments            In 1 month Laural Benes, Oralia Rud, DO [employer] [facility]   In 2 months Althea Charon, [name], DO [facility], [facility]

## 2021-11-06 ENCOUNTER — Other Ambulatory Visit: Payer: Self-pay | Admitting: Family Medicine

## 2021-11-06 NOTE — Telephone Encounter (Signed)
Patient was not given enough pills to last until her appointment- RF for #30 granted- patient takes 2 pills/day Requested Prescriptions  Pending Prescriptions Disp Refills  . metoprolol tartrate (LOPRESSOR) 50 MG tablet [Pharmacy Med Name: METOPROLOL TARTRATE 50 MG TAB] 30 tablet 0    Sig: TAKE 1 TABLET BY MOUTH 2 (TWO) TIMES DAILY. NEEDS IN PERSON APPOINTMENT WITH LABS FOR MORE REFILLS.     Cardiovascular:  Beta Blockers Passed - 11/06/2021 11:32 AM      Passed - Last BP in normal range    BP Readings from Last 1 Encounters:  08/21/21 138/70         Passed - Last Heart Rate in normal range    Pulse Readings from Last 1 Encounters:  08/21/21 99         Passed - Valid encounter within last 6 months    Recent Outpatient Visits          2 months ago Upper respiratory tract infection, unspecified type   [facility] [place], [name], DO   1 year ago Acute cystitis with hematuria   [facility] [place], [place], DO   1 year ago Recurrent falls   [facility] [place], [place], DO   1 year ago Fibromyalgia   [facility] [name], [name]   2 years ago Essential hypertension   [facility] [place], Oralia Rud, DO      Future Appointments            In 2 weeks Laural Benes, Oralia Rud, DO [employer] [facility]   In 2 months Althea Charon, [name], DO [facility], [facility]

## 2021-11-20 ENCOUNTER — Encounter: Payer: Medicare Other | Admitting: Family Medicine

## 2021-11-23 ENCOUNTER — Other Ambulatory Visit: Payer: Self-pay | Admitting: Family Medicine

## 2021-11-23 NOTE — Telephone Encounter (Signed)
Requested medication (s) are due for refill today: no  Requested medication (s) are on the active medication list: yes  Last refill:  11/06/21 #30/0 RF  Future visit scheduled: Yes with [facility]  Notes to clinic:  pt is requesting refill but doesn't have appt with [facility] until 01/2022. Please advise     Requested Prescriptions  Pending Prescriptions Disp Refills   metoprolol tartrate (LOPRESSOR) 50 MG tablet [Pharmacy Med Name: METOPROLOL TARTRATE 50 MG TAB] 30 tablet 0    Sig: TAKE 1 TABLET BY MOUTH 2 (TWO) TIMES DAILY. NEEDS IN PERSON APPOINTMENT WITH LABS FOR MORE REFILLS.     Cardiovascular:  Beta Blockers Passed - 11/23/2021  1:36 PM      Passed - Last BP in normal range    BP Readings from Last 1 Encounters:  08/21/21 138/70          Passed - Last Heart Rate in normal range    Pulse Readings from Last 1 Encounters:  08/21/21 99          Passed - Valid encounter within last 6 months    Recent Outpatient Visits           3 months ago Upper respiratory tract infection, unspecified type   [facility] [place], [name], DO   1 year ago Acute cystitis with hematuria   [facility] [name], [name], DO   1 year ago Recurrent falls   [facility] [place], [name], DO   2 years ago Fibromyalgia   [facility] [name], [name]   2 years ago Essential hypertension   [facility] [name], DO       Future Appointments             In 1 month Althea Charon, [name], DO [facility], [facility]

## 2021-11-27 ENCOUNTER — Other Ambulatory Visit: Payer: Self-pay | Admitting: Family Medicine

## 2021-11-28 NOTE — Telephone Encounter (Signed)
Requested medication (s) are due for refill today:   yes  Requested medication (s) are on the active medication list:   Yes  Future visit scheduled:   Yes   Last ordered: 11/06/2021 #30, 0 refills  Returned because needs to be seen per note before further refills.  Appt is for 01/14/2022.  Provider to review for refills prior to appt.   Requested Prescriptions  Pending Prescriptions Disp Refills   metoprolol tartrate (LOPRESSOR) 50 MG tablet [Pharmacy Med Name: METOPROLOL TARTRATE 50 MG TAB] 30 tablet 0    Sig: TAKE 1 TABLET BY MOUTH 2 (TWO) TIMES DAILY. NEEDS IN PERSON APPOINTMENT WITH LABS FOR MORE REFILLS.     Cardiovascular:  Beta Blockers Passed - 11/27/2021  2:15 PM      Passed - Last BP in normal range    BP Readings from Last 1 Encounters:  08/21/21 138/70          Passed - Last Heart Rate in normal range    Pulse Readings from Last 1 Encounters:  08/21/21 99          Passed - Valid encounter within last 6 months    Recent Outpatient Visits           3 months ago Upper respiratory tract infection, unspecified type   [facility] [place], [name], DO   1 year ago Acute cystitis with hematuria   [facility] [place], [place], DO   1 year ago Recurrent falls   [facility] [place], [place], DO   2 years ago Fibromyalgia   [facility] [name], [name]   2 years ago Essential hypertension   [facility] [name], DO       Future Appointments             In 1 month Althea Charon, [name], DO [facility], [facility]

## 2021-12-12 ENCOUNTER — Telehealth: Payer: Self-pay | Admitting: Family Medicine

## 2021-12-12 NOTE — Telephone Encounter (Signed)
Copied from CRM [id number]. Topic: Medicare AWV >> Dec 12, 2021  3:23 PM Leigh Aurora wrote: Reason for CRM:  Left message for patient to call back and schedule the Medicare Annual Wellness Visit (AWV) virtually or by telephone.  Last AWV 03/15/20  Please schedule at anytime with CFP-Nurse Health Advisor.  45 minute appointment  Any questions, please call me at [phone]

## 2021-12-24 ENCOUNTER — Encounter: Payer: Self-pay | Admitting: Family Medicine

## 2022-01-01 ENCOUNTER — Other Ambulatory Visit: Payer: Self-pay | Admitting: Family Medicine

## 2022-01-14 ENCOUNTER — Other Ambulatory Visit: Payer: Self-pay

## 2022-01-14 ENCOUNTER — Ambulatory Visit (INDEPENDENT_AMBULATORY_CARE_PROVIDER_SITE_OTHER): Payer: Medicare Other | Admitting: Family Medicine

## 2022-01-14 ENCOUNTER — Encounter: Payer: Self-pay | Admitting: Family Medicine

## 2022-01-14 VITALS — BP 142/98 | HR 102 | Ht 69.0 in | Wt 244.8 lb

## 2022-01-14 DIAGNOSIS — A6000 Herpesviral infection of urogenital system, unspecified: Secondary | ICD-10-CM | POA: Insufficient documentation

## 2022-01-14 DIAGNOSIS — I10 Essential (primary) hypertension: Secondary | ICD-10-CM

## 2022-01-14 DIAGNOSIS — G894 Chronic pain syndrome: Secondary | ICD-10-CM

## 2022-01-14 DIAGNOSIS — G4700 Insomnia, unspecified: Secondary | ICD-10-CM | POA: Diagnosis not present

## 2022-01-14 DIAGNOSIS — K5909 Other constipation: Secondary | ICD-10-CM

## 2022-01-14 DIAGNOSIS — M797 Fibromyalgia: Secondary | ICD-10-CM

## 2022-01-14 DIAGNOSIS — Z1211 Encounter for screening for malignant neoplasm of colon: Secondary | ICD-10-CM

## 2022-01-14 DIAGNOSIS — E782 Mixed hyperlipidemia: Secondary | ICD-10-CM

## 2022-01-14 DIAGNOSIS — K219 Gastro-esophageal reflux disease without esophagitis: Secondary | ICD-10-CM

## 2022-01-14 DIAGNOSIS — F331 Major depressive disorder, recurrent, moderate: Secondary | ICD-10-CM | POA: Diagnosis not present

## 2022-01-14 DIAGNOSIS — I2510 Atherosclerotic heart disease of native coronary artery without angina pectoris: Secondary | ICD-10-CM

## 2022-01-14 DIAGNOSIS — Z1231 Encounter for screening mammogram for malignant neoplasm of breast: Secondary | ICD-10-CM

## 2022-01-14 MED ORDER — VALACYCLOVIR HCL 1 G PO TABS: 1000.0000 mg | ORAL_TABLET | Freq: Two times a day (BID) | ORAL | 3 refills | Status: DC

## 2022-01-14 MED ORDER — LINZESS 145 MCG PO CAPS: 145.0000 ug | ORAL_CAPSULE | Freq: Every day | ORAL | 1 refills | Status: DC

## 2022-01-14 MED ORDER — CYCLOBENZAPRINE HCL 10 MG PO TABS: 10.0000 mg | ORAL_TABLET | Freq: Two times a day (BID) | ORAL | 1 refills | Status: DC | PRN

## 2022-01-14 MED ORDER — METOPROLOL TARTRATE 50 MG PO TABS: 50.0000 mg | ORAL_TABLET | Freq: Two times a day (BID) | ORAL | 1 refills | Status: DC

## 2022-01-14 MED ORDER — PANTOPRAZOLE SODIUM 40 MG PO TBEC: 40.0000 mg | DELAYED_RELEASE_TABLET | Freq: Every day | ORAL | 1 refills | Status: DC

## 2022-01-14 MED ORDER — LISINOPRIL 5 MG PO TABS: 5.0000 mg | ORAL_TABLET | Freq: Every day | ORAL | 1 refills | Status: DC

## 2022-01-14 MED ORDER — PREGABALIN 150 MG PO CAPS: 150.0000 mg | ORAL_CAPSULE | Freq: Three times a day (TID) | ORAL | 2 refills | Status: DC

## 2022-01-14 MED ORDER — ATORVASTATIN CALCIUM 80 MG PO TABS: 80.0000 mg | ORAL_TABLET | Freq: Every day | ORAL | 1 refills | Status: DC

## 2022-01-14 NOTE — Patient Instructions (Addendum)
Thank you for coming to the office today.  Call them to schedule. Return to Dr Cherylann Ratel, discuss Spinal Cord Stimulator options and next level treatment if current therapy is having limited success.  For Blood pressure, mild elevated today. Goal to restart BP medications Lisinopril 5mg  daily and Metoprolol 50mg  twice a day ordered 90 day supply with 1 extra refill  If BP at home is >140/90, consistently let me know we can double up dose of Lisinopril.  These offices have both PSYCHIATRY doctors and THERAPISTS  [facility] (Virtual Available) [place] [address] [place], [address] [place] Phone: [phone]  [facility] Address: [address], [place], [address] [place] [email or web address] Phone: [phone]  [facility]) Address: [address], [place], [address] [place] Hours: 8:30AM-5PM Phone: [phone]  [facility] [address] [place],  [facility]  [place] Phone: [phone] Fax: [phone]  [facility] [address] [place], [address] [place] Phone: [phone]  [facility] [address], [facility] [place] [place], [place] Phone: [phone] (Option 1) [email or web address]  ----------------------------------------------------------------- THERAPIST ONLY  (No Psychiatry)  [facility] [address] [place], [address] [place] P: [phone]  [facility]   Address: [address] [place], [place], [place] Hours: Open today  9AM-7PM Phone: [phone]  [facility] [address] [facility] Address: [address], [place], [address] [place] Phone: [phone]  -------------------------------------------------------------  For Mammogram screening for breast cancer    Call the Imaging Center below anytime to schedule your own appointment now that order has been placed.  [facility] [address] [place], [address] [place] Phone: [phone]  -----------------------------------------------  Referral for Colonoscopy they will call you to schedule.  [facility]) [address] [place], [address] [place] Phone: [phone]   Please schedule a Follow-up Appointment to: Return in about 3 months (around 04/13/2022) for 3 month follow-up updates pain / HTN / refills.  If you have any other questions or concerns, please feel free to call the office or send a message through MyChart. You may also schedule an earlier appointment if necessary.  Additionally, you may be receiving a survey about your experience at our office within a few days to 1 week by e-mail or mail. We value your feedback.  [phone], DO [facility], Saralyn Pilar
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
## 2022-01-15 ENCOUNTER — Other Ambulatory Visit: Payer: Self-pay

## 2022-01-15 DIAGNOSIS — Z800 Family history of malignant neoplasm of digestive organs: Secondary | ICD-10-CM

## 2022-01-15 DIAGNOSIS — Z1211 Encounter for screening for malignant neoplasm of colon: Secondary | ICD-10-CM

## 2022-01-15 MED ORDER — PEG 3350-KCL-NA BICARB-NACL 420 G PO SOLR: 4000.0000 mL | Freq: Once | ORAL | 0 refills | Status: AC

## 2022-01-15 NOTE — Progress Notes (Signed)
Gastroenterology Pre-Procedure Review  Request Date: 02/05/2022 Requesting Physician: Dr. Allegra Lai  PATIENT REVIEW QUESTIONS: The patient responded to the following health history questions as indicated:    1. Are you having any GI issues? no 2. Do you have a personal history of Polyps? no 3. Do you have a family history of Colon Cancer or Polyps? yes (father- colon cancer) 4. Diabetes Mellitus? no 5. Joint replacements in the past 12 months?no 6. Major health problems in the past 3 months?no 7. Any artificial heart valves, MVP, or defibrillator?yes (Stints)    MEDICATIONS & ALLERGIES:    Patient reports the following regarding taking any anticoagulation/antiplatelet therapy:   Plavix, Coumadin, Eliquis, Xarelto, Lovenox, Pradaxa, Brilinta, or Effient? yes (Plavix 75 mg) Aspirin? no  Patient confirms/reports the following medications:  Current Outpatient Medications  Medication Sig Dispense Refill   amitriptyline (ELAVIL) 75 MG tablet Take 1 tablet (75 mg total) by mouth at bedtime. 90 tablet 2   atorvastatin (LIPITOR) 80 MG tablet Take 1 tablet (80 mg total) by mouth daily. 90 tablet 1   calcium-vitamin D 250-100 MG-UNIT tablet Take 1 tablet by mouth 2 (two) times daily.     clopidogrel (PLAVIX) 75 MG tablet TAKE 1 TABLET BY MOUTH DAILY WITH BREAKFAST. 90 tablet 2   [facility] ALLERGY 25 MG capsule TAKE 1 CAPSULE BY MOUTH EVERY 6 HOURS AS NEEDED 48 capsule 3   cyclobenzaprine (FLEXERIL) 10 MG tablet Take 1 tablet (10 mg total) by mouth 2 (two) times daily as needed for muscle spasms. 180 tablet 1   LINZESS 145 MCG CAPS capsule Take 1 capsule (145 mcg total) by mouth daily before breakfast. 90 capsule 1   lisinopril (ZESTRIL) 5 MG tablet Take 1 tablet (5 mg total) by mouth daily. 90 tablet 1   metoprolol tartrate (LOPRESSOR) 50 MG tablet Take 1 tablet (50 mg total) by mouth 2 (two) times daily. 180 tablet 1   nitroGLYCERIN (NITROSTAT) 0.4 MG SL tablet TAKE 1 TABLET BY MOUTH UNDER TONGUE EVERY 5  MIN AS NEEDED FOR CHEST PAIN 25 tablet 0   pantoprazole (PROTONIX) 40 MG tablet Take 1 tablet (40 mg total) by mouth daily before breakfast. 90 tablet 1   pregabalin (LYRICA) 150 MG capsule Take 1 capsule (150 mg total) by mouth 3 (three) times daily. 90 capsule 2   valACYclovir (VALTREX) 1000 MG tablet Take 1 tablet (1,000 mg total) by mouth 2 (two) times daily. 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    Patient confirms/reports the following allergies:  Allergies  Allergen Reactions   Aspirin-Acetaminophen-Caffeine Other (See Comments)   Ativan [Lorazepam] Hives   Baclofen Rash    No orders of the defined types were placed in this encounter.   AUTHORIZATION INFORMATION Primary Insurance: 1D#: Group #:  Secondary Insurance: 1D#: Group #:  SCHEDULE INFORMATION: Date: 02/05/2022 Time: Location: [facility]

## 2022-01-24 ENCOUNTER — Other Ambulatory Visit: Payer: Self-pay

## 2022-01-24 ENCOUNTER — Ambulatory Visit (INDEPENDENT_AMBULATORY_CARE_PROVIDER_SITE_OTHER): Payer: Medicare Other

## 2022-01-24 ENCOUNTER — Telehealth: Payer: Self-pay | Admitting: Family Medicine

## 2022-01-24 DIAGNOSIS — Z0000 Encounter for general adult medical examination without abnormal findings: Secondary | ICD-10-CM

## 2022-01-24 NOTE — Telephone Encounter (Signed)
In response to patient's question today regarding her elevated BP:  She can double the Lisinopril 5mg  to 10mg . Take two of the 5s for now. May need new rx of 10mg  or higher in future.  Nobie Putnam, DO [facility] 01/24/2022, 3:13 PM
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
## 2022-01-24 NOTE — Telephone Encounter (Signed)
The pt was notified to increase her Lisinopril as directed by Dr. Althea Charon. She will follow up with [place] to let us know a few days before she run out to request a refill.

## 2022-01-28 ENCOUNTER — Telehealth: Payer: Self-pay | Admitting: *Deleted

## 2022-01-28 ENCOUNTER — Other Ambulatory Visit: Payer: Self-pay | Admitting: Cardiovascular Disease

## 2022-01-28 ENCOUNTER — Other Ambulatory Visit: Payer: Self-pay | Admitting: Student in an Organized Health Care Education/Training Program

## 2022-01-28 DIAGNOSIS — G894 Chronic pain syndrome: Secondary | ICD-10-CM

## 2022-01-28 NOTE — Telephone Encounter (Signed)
Tried to reach pt to schedule an appt for pre op clearance. Pt's vm is not set up, could not leave a message. I will update the requesting office that pt will need an appt for pre op clearance. In hope that the requesting office s/w the pt to let her know she needs to her cardiologist office Dr. Mariah Milling for an appt.

## 2022-01-28 NOTE — Telephone Encounter (Signed)
Will forward clearance notes to [facility] Cadence Furth for upcoming appt. Will send FYI to requesting office the pt has appt 01/30/22.

## 2022-01-28 NOTE — Telephone Encounter (Signed)
° °  Pre-operative Risk Assessment    Patient Name: Susan Macias  DOB: June 18, 1971 MRN: [id number]      Request for Surgical Clearance    Procedure:   COLONOSCOPY  Date of Surgery:  Clearance 02/05/22                                 Surgeon:  Lannette Donath, MD Surgeon's Group or Practice Name:  [facility] Phone number:  [phone] Fax number:  [phone]    Type of Clearance Requested:   - Medical  - Pharmacy:  Hold Clopidogrel (Plavix) x 5 DAYS PRIOR   Type of Anesthesia:   PROPOFOL   Additional requests/questions:    Elpidio Anis   01/28/2022, 11:19 AM          Wilnette Kales, CMA on 01/15/2022      [facility] [place] [address] [place], [place]  [address] Phone:  [phone]   Fax:  [phone]                                                        BLOOD THINNER INFORMATION REQUEST 01/15/2022     Dear Dr. Julien Nordmann,   Your patient [name] (DOB:  [birth year] ) has been scheduled for a Colonoscopy procedure on 02/05/2022.   Your patient states that you have prescribed Plavix 75 mg. Please complete this form, sign and fax to [phone] as soon as possible to ATTN: Jovon E Hinton. Please call [phone] with any questions.   Thank you,   Lannette Donath, MD                                                     BLOOD THINNER INFORMATION SHEET   Patient may stop Plavix 75 mg (5-day hold) _______ days prior to procedure. Patient is to restart blood thinner ______ days after procedure or other directions below:   ______________________________________________________________________   Patient cannot stop blood thinner due to medical necessisty. (Please contact        patient and give prescriptions instructions)     Signature: __________________________________________                     [facility] Filbert Schilder, [phone]                      1

## 2022-01-28 NOTE — Telephone Encounter (Signed)
Patient is scheduled to see Cadence Furth 01/30/22 at 10:55 am

## 2022-01-29 ENCOUNTER — Telehealth: Payer: Self-pay | Admitting: Cardiovascular Disease

## 2022-01-29 ENCOUNTER — Telehealth: Payer: Self-pay

## 2022-01-29 NOTE — Telephone Encounter (Signed)
Made in error

## 2022-01-29 NOTE — Telephone Encounter (Signed)
Patient called and rescheduled appointment to 02/27/2022 called endo sent new refferal to [facility] and sent new communications

## 2022-01-29 NOTE — Telephone Encounter (Signed)
Called patient about her blood thinner clearance and reminded her of the prep instructions

## 2022-01-30 ENCOUNTER — Ambulatory Visit: Payer: Medicare Other | Admitting: Medical

## 2022-02-25 ENCOUNTER — Telehealth: Payer: Self-pay

## 2022-02-25 ENCOUNTER — Ambulatory Visit: Payer: Self-pay

## 2022-02-25 DIAGNOSIS — M47816 Spondylosis without myelopathy or radiculopathy, lumbar region: Secondary | ICD-10-CM

## 2022-02-25 DIAGNOSIS — G894 Chronic pain syndrome: Secondary | ICD-10-CM

## 2022-02-25 DIAGNOSIS — M47812 Spondylosis without myelopathy or radiculopathy, cervical region: Secondary | ICD-10-CM

## 2022-02-25 DIAGNOSIS — M797 Fibromyalgia: Secondary | ICD-10-CM

## 2022-02-25 NOTE — Telephone Encounter (Signed)
Please notify patient that I have placed referral to [facility] ? ?Saralyn Pilar, DO ?[facility] ?[facility] ?02/25/2022, 12:02 PM ? ?

## 2022-02-25 NOTE — Telephone Encounter (Signed)
Patient is needing to reschedule her colonoscopy due for being in a fibromyalgia flare up. Talk to patient and reschedule her to 03/19/22 with Dr. Allegra Lai. Called Endo and moved patient. Sent new instructions to [employer] and mailed them  ?

## 2022-02-25 NOTE — Telephone Encounter (Signed)
? ?  Chief Complaint: Neck and spine pain, decreased activity. "I feel like I'm fading away because I can't get around and do things I used to do." "The pills don't really help." ?Symptoms: Has had fibromyalgia since 2013 and has degenerative joint disease. ?Frequency: Last week ?Pertinent Negatives: Patient denies  ?Disposition: [] ED /[] Urgent Care (no appt availability in office) / [] Appointment(In office/virtual)/ []  [facility]/ [] Home Care/ [] Refused Recommended Disposition /[] [facility]/ []  Follow-up with PCP ?Additional Notes: Pt. Is asking to be referred to [facility]. Please advise pt.  ? ?Answer Assessment - Initial Assessment Questions ?1. ONSET: "When did the pain begin?"  ?    Last week ?2. LOCATION: "Where does it hurt?"  ?    Neck and low back ?3. PATTERN "Does the pain come and go, or has it been constant since it started?"  ?    Constant now ?4. SEVERITY: "How bad is the pain?"  (Scale 1-10; or mild, moderate, severe) ?  - NO PAIN (0): no pain or only slight stiffness  ?  - MILD (1-3): doesn't interfere with normal activities  ?  - MODERATE (4-7): interferes with normal activities or awakens from sleep  ?  - SEVERE (8-10):  excruciating pain, unable to do any normal activities  ?    Moderate-severe ?5. RADIATION: "Does the pain go anywhere else, shoot into your arms?" ?    Down her spine ?6. CORD SYMPTOMS: "Any weakness or numbness of the arms or legs?" ?    No ?7. CAUSE: "What do you think is causing the neck pain?" ?    Fibromyalgia and joint disease  ?8. NECK OVERUSE: "Any recent activities that involved turning or twisting the neck?" ?    No ?9. OTHER SYMPTOMS: "Do you have any other symptoms?" (e.g., headache, fever, chest pain, difficulty breathing, neck swelling) ?    Has decreased activity ?10. PREGNANCY: "Is there any chance you are pregnant?" "When was your last menstrual period?" ?      No ? ?Protocols used: Neck Pain or Stiffness-A-AH ? ?

## 2022-03-08 ENCOUNTER — Ambulatory Visit: Payer: Medicare Other | Admitting: Medical
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
## 2022-03-14 ENCOUNTER — Ambulatory Visit: Payer: Self-pay | Admitting: *Deleted

## 2022-03-14 NOTE — Telephone Encounter (Signed)
Patient called in to inform Dr Kirtland Bouchard that she has a knot above her navel and recently she have been having nausea also a knot on her knee that seem to be getting bigger. Please call patient at Ph#  [phone]  ? ? ?Attempted to reach pt. VM full. Unable to leave message. ?

## 2022-03-14 NOTE — Telephone Encounter (Signed)
Pt left message to resched procedure from 4/18 to sometime in June if possible ?

## 2022-03-14 NOTE — Telephone Encounter (Signed)
2nd attempt to reach ppt, unable to leave message "Call cannot be completed at this time."  Only number provided. ?

## 2022-03-14 NOTE — Telephone Encounter (Signed)
?  Chief Complaint: "Lump" at navel ?Symptoms: Lump at navel noted 2 weeks ago "Under skin, smaller than quarter size" Painful only when "Squeezing" Also knot on right knee "Getting bigger" Some nausea, no vomiting ?Frequency: 2 weeks ago  ?redness, warmth, drainage ?Pertinent Negatives: Patient denies redness, warmth, drainage,itching,fever ?Disposition: [] ED /[] Urgent Care (no appt availability in office) / [x] Appointment(In office/virtual)/ []  [facility]/ [] Home Care/ [] Refused Recommended Disposition /[] [facility]/ []  Follow-up with PCP ?Additional Notes: Also reports knot on right knee bigger. Seen about that before." Appt secured for Wednesday 03/20/22 per pts schedule.Care advise given, verbalizes understanding.  ?Reason for Disposition ? [1] Small swelling or lump AND [2] unexplained AND [3] present > 1 week ? ?Answer Assessment - Initial Assessment Questions ?1. APPEARANCE of SWELLING: "What does it look like?" (e.g., lymph node, insect bite, mole) ?    Under skin ?2. SIZE: "How large is the swelling?" (e.g., inches, cm; or compare to size of pinhead, tip of pen, eraser, coin, pea, grape, ping pong ball)  ?    Little smaller than quarter ?3. LOCATION: "Where is the swelling located?" ?    On Navel behind skin ?4. ONSET: "When did the swelling start?" ?    2 weeks ago ?5. PAIN: "Is it painful?" If Yes, ask: "How much?" ?    Tender to touch, if you squeeze it, tender ?6. ITCH: "Does it itch?" If Yes, ask: "How much?" ?    no ?7. CAUSE: "What do you think caused the swelling?" ?    Unsure ?8. OTHER SYMPTOMS: "Do you have any other symptoms?" (e.g., fever) ?    Nausea, 2 weeks ago, "Reflux"  Gassy  Knot on right knee getting bigger ? ?Protocols used: Skin Lump or Localized Swelling-A-AH ? ?

## 2022-03-15 ENCOUNTER — Telehealth: Payer: Self-pay

## 2022-03-15 NOTE — Telephone Encounter (Signed)
Rescheduled to 05/21/2022 called endo sent new communication and new referral to [facility]  ?

## 2022-03-20 ENCOUNTER — Ambulatory Visit: Payer: Medicare Other | Admitting: Family Medicine

## 2022-03-25 ENCOUNTER — Ambulatory Visit: Payer: Medicare Other | Admitting: Family Medicine

## 2022-04-05 ENCOUNTER — Ambulatory Visit: Payer: Medicare Other | Admitting: Medical
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
## 2022-04-12 ENCOUNTER — Ambulatory Visit (INDEPENDENT_AMBULATORY_CARE_PROVIDER_SITE_OTHER): Payer: Medicare Other | Admitting: Family Medicine

## 2022-04-12 ENCOUNTER — Encounter: Payer: Self-pay | Admitting: Family Medicine

## 2022-04-12 VITALS — BP 124/78 | HR 88 | Ht 69.0 in | Wt 243.8 lb

## 2022-04-12 DIAGNOSIS — R140 Abdominal distension (gaseous): Secondary | ICD-10-CM

## 2022-04-12 MED ORDER — POLYETHYLENE GLYCOL 3350 17 GM/SCOOP PO POWD: 17.0000 g | Freq: Every day | ORAL | 1 refills | Status: DC | PRN

## 2022-04-12 NOTE — Patient Instructions (Addendum)
Thank you for coming to the office today. ? ?Upcoming Colonoscopy keep as scheduled 6/20 ? ?After that evaluation, if there are still any concerns, we can work with GI and order other testing such as CT imaging to check the belly button area. ? ?Could be a small Umbilical Hernia - weak spot that is pushing out more, with the bloating. Or could be one of those fibrous scar nodules. ? ?BP is great today. Keep up the good work. ? ?For Constipation (less frequent bowel movement that can be hard dry or involve straining). ? ?Recommend trying OTC Miralax 17g = 1 capful in large glass water once daily for now, try several days to see if working, goal is soft stool or BM 1-2 times daily, if too loose then reduce dose or try every other day. If not effective may need to increase it to 2 doses at once in AM or may do 1 in morning and 1 in afternoon/evening ? ?- This medicine is very safe and can be used often without any problem and will not make you dehydrated. It is good for use on AS NEEDED BASIS or even MAINTENANCE therapy for longer term for several days to weeks at a time to help regulate bowel movements ? ?Other more natural remedies or preventative treatment: ?- Increase hydration with water ?- Increase fiber in diet (high fiber foods = vegetables, leafy greens, oats/grains) ?- May take OTC Fiber supplement (metamucil powder or pill/gummy) ?- May try OTC Probiotic ? ? ?Please schedule a Follow-up Appointment to: Return in about 3 months (around 07/13/2022) for 3 month follow-up Abd Bloating GI / [facility] Specialists updates. ? ?If you have any other questions or concerns, please feel free to call the office or send a message through MyChart. You may also schedule an earlier appointment if necessary. ? ?Additionally, you may be receiving a survey about your experience at our office within a few days to 1 week by e-mail or mail. We value your feedback. ? ?Saralyn Pilar, DO ?[facility], [name] ?
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
## 2022-04-21 ENCOUNTER — Other Ambulatory Visit: Payer: Self-pay | Admitting: Family Medicine

## 2022-04-21 DIAGNOSIS — K219 Gastro-esophageal reflux disease without esophagitis: Secondary | ICD-10-CM

## 2022-04-21 DIAGNOSIS — M797 Fibromyalgia: Secondary | ICD-10-CM

## 2022-04-21 DIAGNOSIS — G894 Chronic pain syndrome: Secondary | ICD-10-CM

## 2022-04-21 DIAGNOSIS — E782 Mixed hyperlipidemia: Secondary | ICD-10-CM

## 2022-04-21 DIAGNOSIS — I10 Essential (primary) hypertension: Secondary | ICD-10-CM

## 2022-04-23 NOTE — Telephone Encounter (Signed)
Requested medication (s) are due for refill today: no  Requested medication (s) are on the active medication list: yes   Last refill:  Lisinopril: 01/14/22 #90 1 RF                     Cyclobenzaprine: 01/14/22 #180 1 RF  Future visit scheduled: yes  Notes to clinic:  Lisinopril: overdue lab work              Cyclobenzaprine: no delegated to NT to RF   Requested Prescriptions  Pending Prescriptions Disp Refills   lisinopril (ZESTRIL) 5 MG tablet [Pharmacy Med Name: LISINOPRIL 5 MG TABLET] 90 tablet     Sig: TAKE 1 TABLET (5 MG TOTAL) BY MOUTH DAILY.     Cardiovascular:  ACE Inhibitors Failed - 04/21/2022  1:32 AM      Failed - Cr in normal range and within 180 days    Creatinine  Date Value Ref Range Status  02/17/2014 0.78 0.60 - 1.30 mg/dL Final   Creatinine, Ser  Date Value Ref Range Status  05/18/2021 0.89 0.57 - 1.00 mg/dL Final         Failed - K in normal range and within 180 days    Potassium  Date Value Ref Range Status  05/18/2021 4.3 3.5 - 5.2 mmol/L Final  02/17/2014 3.8 3.5 - 5.1 mmol/L Final         Passed - Patient is not pregnant      Passed - Last BP in normal range    BP Readings from Last 1 Encounters:  04/12/22 124/78         Passed - Valid encounter within last 6 months    Recent Outpatient Visits           1 week ago Abdominal bloating   [facility] Smitty Cords, DO   3 months ago Fibromyalgia   [facility] Smitty Cords, DO   8 months ago Upper respiratory tract infection, unspecified type   [facility] [place], [place], DO   1 year ago Acute cystitis with hematuria   [facility] [place], [place], DO   2 years ago Recurrent falls   [facility] [place], [place], DO       Future Appointments             In 1 week Furth, Cadence H, PA-C [facility]   In 2 months Althea Charon, [name], DO [facility], [facility]              cyclobenzaprine (FLEXERIL) 10 MG tablet [Pharmacy Med Name: CYCLOBENZAPRINE 10 MG TABLET] 180 tablet     Sig: TAKE 1 TABLET BY MOUTH TWICE A DAY AS NEEDED FOR MUSCLE SPASMS     Not Delegated - Analgesics:  Muscle Relaxants Failed - 04/21/2022  1:32 AM      Failed - This refill cannot be delegated      Passed - Valid encounter within last 6 months    Recent Outpatient Visits           1 week ago Abdominal bloating   [facility] Smitty Cords, DO   3 months ago Fibromyalgia   [facility] [place], [name], DO   8 months ago Upper respiratory tract infection, unspecified type   [facility] [place], [name], DO   1 year ago Acute cystitis with  hematuria   [facility] [place], [place], DO   2 years ago Recurrent falls   [facility] [place], [place], DO       Future Appointments             In 1 week Furth, Cadence H, PA-C [facility], [facility]   In 2 months Althea Charon, [name], DO [facility], [place]             Refused Prescriptions Disp Refills   metoprolol tartrate (LOPRESSOR) 50 MG tablet [Pharmacy Med Name: METOPROLOL TARTRATE 50 MG TAB] 180 tablet     Sig: TAKE 1 TABLET BY MOUTH TWICE A DAY     Cardiovascular:  Beta Blockers Passed - 04/21/2022  1:32 AM      Passed - Last BP in normal range    BP Readings from Last 1 Encounters:  04/12/22 124/78         Passed - Last Heart Rate in normal range    Pulse Readings from Last 1 Encounters:  04/12/22 88         Passed - Valid encounter within last 6 months    Recent Outpatient Visits           1 week ago Abdominal bloating   [facility] Smitty Cords, DO   3 months ago Fibromyalgia   [facility] [place], [name], DO   8 months ago Upper respiratory tract infection, unspecified type   [facility]  [place], [place], DO   1 year ago Acute cystitis with hematuria   [facility] [place], [place], DO   2 years ago Recurrent falls   [facility] [place], [place], DO       Future Appointments             In 1 week Furth, Cadence H, PA-C [facility]   In 2 months Althea Charon, [name], DO [facility]              pantoprazole (PROTONIX) 40 MG tablet [Pharmacy Med Name: PANTOPRAZOLE SOD DR 40 MG TAB] 90 tablet     Sig: TAKE 1 TABLET BY MOUTH DAILY BEFORE BREAKFAST     Gastroenterology: Proton Pump Inhibitors Passed - 04/21/2022  1:32 AM      Passed - Valid encounter within last 12 months    Recent Outpatient Visits           1 week ago Abdominal bloating   [facility] [place], [name], DO   3 months ago Fibromyalgia   [facility] [place], [name], DO   8 months ago Upper respiratory tract infection, unspecified type   [facility] [place], [place], DO   1 year ago Acute cystitis with hematuria   [facility] [place], [place], DO   2 years ago Recurrent falls   [facility] [place], [place], DO       Future Appointments             In 1 week Furth, Cadence H, PA-C [facility]   In 2 months Althea Charon, [name], DO [facility], [facility]              atorvastatin (LIPITOR) 80 MG tablet [Pharmacy Med Name: ATORVASTATIN 80 MG TABLET] 90 tablet     Sig: TAKE 1 TABLET BY MOUTH EVERY  DAY     Cardiovascular:  Antilipid - Statins Failed - 04/21/2022  1:32 AM      Failed - Lipid Panel in normal range within the last 12 months    Cholesterol, Total  Date Value Ref Range Status  10/22/2019 146 100 - 199 mg/dL Final   LDL Chol Calc (NIH)  Date Value Ref Range Status  10/22/2019 62 0 - 99 mg/dL Final   HDL  Date Value Ref Range Status  10/22/2019 49 >39 mg/dL Final    Triglycerides  Date Value Ref Range Status  10/22/2019 216 (H) 0 - 149 mg/dL Final         Passed - Patient is not pregnant      Passed - Valid encounter within last 12 months    Recent Outpatient Visits           1 week ago Abdominal bloating   [facility] [place], [name], DO   3 months ago Fibromyalgia   [facility] [place], [name], DO   8 months ago Upper respiratory tract infection, unspecified type   [facility] [place], [place], DO   1 year ago Acute cystitis with hematuria   [facility] [place], [place], DO   2 years ago Recurrent falls   [facility] [place], [place], DO       Future Appointments             In 1 week Furth, Cadence H, PA-C [facility]   In 2 months Althea Charon, [name], DO [facility], [facility]

## 2022-04-23 NOTE — Telephone Encounter (Signed)
Requested Prescriptions  Pending Prescriptions Disp Refills  . lisinopril (ZESTRIL) 5 MG tablet [Pharmacy Med Name: LISINOPRIL 5 MG TABLET] 90 tablet     Sig: TAKE 1 TABLET (5 MG TOTAL) BY MOUTH DAILY.     Cardiovascular:  ACE Inhibitors Failed - 04/21/2022  1:32 AM      Failed - Cr in normal range and within 180 days    Creatinine  Date Value Ref Range Status  02/17/2014 0.78 0.60 - 1.30 mg/dL Final   Creatinine, Ser  Date Value Ref Range Status  05/18/2021 0.89 0.57 - 1.00 mg/dL Final         Failed - K in normal range and within 180 days    Potassium  Date Value Ref Range Status  05/18/2021 4.3 3.5 - 5.2 mmol/L Final  02/17/2014 3.8 3.5 - 5.1 mmol/L Final         Passed - Patient is not pregnant      Passed - Last BP in normal range    BP Readings from Last 1 Encounters:  04/12/22 124/78         Passed - Valid encounter within last 6 months    Recent Outpatient Visits          1 week ago Abdominal bloating   [facility] Smit[facility]ords, DO   3 months ago Fibromyalgia   [facility] Smitty Cords, DO   8 months ago Upper respiratory tract infection, unspecified type   [facility] [place], [place], DO   1 year ago Acute cystitis with hematuria   [facility] [place], [place], DO   2 years ago Recurrent falls   [facility] [place], [place], DO      Future Appointments            In 1 week Furth, Cadence H, PA-C [facility]   In 2 months Althea Charon, [name], DO [facility], [facility]           . metoprolol tartrate (LOPRESSOR) 50 MG tablet [Pharmacy Med Name: METOPROLOL TARTRATE 50 MG TAB] 180 tablet     Sig: TAKE 1 TABLET BY MOUTH TWICE A DAY     Cardiovascular:  Beta Blockers Passed - 04/21/2022  1:32 AM      Passed - Last BP in normal range    BP Readings from Last 1 Encounters:  04/12/22 124/78         Passed - Last Heart  Rate in normal range    Pulse Readings from Last 1 Encounters:  04/12/22 88         Passed - Valid encounter within last 6 months    Recent Outpatient Visits          1 week ago Abdominal bloating   [facility] [place], [name], DO   3 months ago Fibromyalgia   [facility] [place], [name], DO   8 months ago Upper respiratory tract infection, unspecified type   [facility] [place], [place], DO   1 year ago Acute cystitis with hematuria   [facility] [place], [place], DO   2 years ago Recurrent falls   [facility] [place], [place], DO      Future Appointments            In 1 week Furth, Cadence H, PA-C [facility]   In 2 months  Althea Charon, [name], DO [facility], [facility]           . cyclobenzaprine (FLEXERIL) 10 MG tablet [Pharmacy Med Name: CYCLOBENZAPRINE 10 MG TABLET] 180 tablet     Sig: TAKE 1 TABLET BY MOUTH TWICE A DAY AS NEEDED FOR MUSCLE SPASMS     Not Delegated - Analgesics:  Muscle Relaxants Failed - 04/21/2022  1:32 AM      Failed - This refill cannot be delegated      Passed - Valid encounter within last 6 months    Recent Outpatient Visits          1 week ago Abdominal bloating   [facility] Smitty Cords, DO   3 months ago Fibromyalgia   [facility] Smitty Cords, DO   8 months ago Upper respiratory tract infection, unspecified type   [facility], [place], DO   1 year ago Acute cystitis with hematuria   [facility] [place], [place], DO   2 years ago Recurrent falls   [facility] [place], [place], DO      Future Appointments            In 1 week Furth, Cadence H, PA-C [facility]   In 2 months Althea Charon, [name], DO [facility]           . pantoprazole (PROTONIX)  40 MG tablet [Pharmacy Med Name: PANTOPRAZOLE SOD DR 40 MG TAB] 90 tablet     Sig: TAKE 1 TABLET BY MOUTH DAILY BEFORE BREAKFAST     Gastroenterology: Proton Pump Inhibitors Passed - 04/21/2022  1:32 AM      Passed - Valid encounter within last 12 months    Recent Outpatient Visits          1 week ago Abdominal bloating   [facility] [place], [name], DO   3 months ago Fibromyalgia   [facility] [place], [name], DO   8 months ago Upper respiratory tract infection, unspecified type   [facility] [place], [place], DO   1 year ago Acute cystitis with hematuria   [facility] [place], [place], DO   2 years ago Recurrent falls   [facility] [place], [place], DO      Future Appointments            In 1 week Furth, Cadence H, PA-C [facility]   In 2 months Althea Charon, [name], DO [facility], [facility]           . atorvastatin (LIPITOR) 80 MG tablet [Pharmacy Med Name: ATORVASTATIN 80 MG TABLET] 90 tablet     Sig: TAKE 1 TABLET BY MOUTH EVERY DAY     Cardiovascular:  Antilipid - Statins Failed - 04/21/2022  1:32 AM      Failed - Lipid Panel in normal range within the last 12 months    Cholesterol, Total  Date Value Ref Range Status  10/22/2019 146 100 - 199 mg/dL Final   LDL Chol Calc (NIH)  Date Value Ref Range Status  10/22/2019 62 0 - 99 mg/dL Final   HDL  Date Value Ref Range Status  10/22/2019 49 >39 mg/dL Final   Triglycerides  Date Value Ref Range Status  10/22/2019 216 (H) 0 - 149 mg/dL Final         Passed - Patient  is not pregnant      Passed - Valid encounter within last 12 months    Recent Outpatient Visits          1 week ago Abdominal bloating   [facility] [place], [name], DO   3 months ago Fibromyalgia   [facility], [name], DO   8 months ago Upper respiratory  tract infection, unspecified type   [facility] [place], [place], DO   1 year ago Acute cystitis with hematuria   [facility] [place], [place], DO   2 years ago Recurrent falls   [facility] [place], [place], DO      Future Appointments            In 1 week Furth, Cadence H, PA-C [facility]   In 2 months Althea Charon, [name], DO [facility], [facility]

## 2022-04-30 ENCOUNTER — Ambulatory Visit: Payer: Medicare Other | Admitting: Medical

## 2022-05-08 ENCOUNTER — Other Ambulatory Visit: Payer: Self-pay | Admitting: Cardiovascular Disease

## 2022-05-18 ENCOUNTER — Other Ambulatory Visit: Payer: Self-pay | Admitting: Family Medicine

## 2022-05-18 DIAGNOSIS — G894 Chronic pain syndrome: Secondary | ICD-10-CM

## 2022-05-20 ENCOUNTER — Telehealth: Payer: Self-pay

## 2022-05-20 NOTE — Telephone Encounter (Signed)
Requested medication (s) are due for refill today: yes  Requested medication (s) are on the active medication list: yes  Last refill:  01/14/22 #90 with 2 RF  Future visit scheduled: 07/15/22  Notes to clinic:  Not delegated, also lab work just out of range, please assess.      Requested Prescriptions  Pending Prescriptions Disp Refills   pregabalin (LYRICA) 150 MG capsule [Pharmacy Med Name: PREGABALIN 150 MG CAPSULE] 90 capsule 2    Sig: TAKE 1 CAPSULE BY MOUTH 3 TIMES DAILY.     Not Delegated - Neurology:  Anticonvulsants - Controlled - pregabalin Failed - 05/18/2022 10:03 PM      Failed - This refill cannot be delegated      Failed - Cr in normal range and within 360 days    Creatinine  Date Value Ref Range Status  02/17/2014 0.78 0.60 - 1.30 mg/dL Final   Creatinine, Ser  Date Value Ref Range Status  05/18/2021 0.89 0.57 - 1.00 mg/dL Final         Passed - Completed PHQ-2 or PHQ-9 in the last 360 days      Passed - Valid encounter within last 12 months    Recent Outpatient Visits           1 month ago Abdominal bloating   [facility] Smitty Cords, DO   4 months ago Fibromyalgia   [facility] Smitty [facility], DO   9 months ago Upper respiratory tract infection, unspecified type   [facility] [place], [place], DO   2 years ago Acute cystitis with hematuria   [facility] [place], [place], DO   2 years ago Recurrent falls   [facility] [place], [place], DO       Future Appointments             In 2 weeks Furth, Cadence H, PA-C [facility]   In 1 month [place], [name], DO [facility], [facility]

## 2022-05-20 NOTE — Telephone Encounter (Signed)
Called patient back no answer and voicemail full

## 2022-05-20 NOTE — Telephone Encounter (Signed)
Called no answer voicemail full ?

## 2022-05-21 ENCOUNTER — Telehealth: Payer: Self-pay | Admitting: Family Medicine

## 2022-05-21 ENCOUNTER — Ambulatory Visit: Admission: RE | Admit: 2022-05-21 | Payer: Medicare Other | Source: Home / Self Care | Admitting: Gastroenterology

## 2022-05-21 ENCOUNTER — Telehealth: Payer: Self-pay

## 2022-05-21 ENCOUNTER — Encounter: Admission: RE | Payer: Self-pay | Source: Home / Self Care

## 2022-05-21 DIAGNOSIS — K429 Umbilical hernia without obstruction or gangrene: Secondary | ICD-10-CM

## 2022-05-21 DIAGNOSIS — R140 Abdominal distension (gaseous): Secondary | ICD-10-CM

## 2022-05-21 SURGERY — COLONOSCOPY WITH PROPOFOL
Anesthesia: General

## 2022-05-21 NOTE — Telephone Encounter (Signed)
Seen recently 5/12  My advice was to pursue Colonoscopy, if it did not find any GI source of her symptoms, next step was CT imaging and General Surgery referral.  She has cancelled colonoscopy and requesting Gen Surgery now.  They need imaging prior to seeing her.  I have ordered CT Abdomen and also referred her to [facility] Gen Surgery  Saralyn Pilar, DO [facility] 05/21/2022, 1:50 PM

## 2022-05-21 NOTE — Telephone Encounter (Signed)
Called patient she wants her primary doctor to get a cat scan done for her stomach first she will call us back if she needs to reschedule

## 2022-05-22 NOTE — Telephone Encounter (Signed)
I spoke with the patient and I let her know about the recommendations and that someone from radiology scheduling will be contacting her to schedule the CT.

## 2022-05-23 ENCOUNTER — Telehealth: Payer: Medicare Other | Admitting: Physician Assistant

## 2022-05-23 ENCOUNTER — Ambulatory Visit: Payer: Self-pay

## 2022-05-23 DIAGNOSIS — J069 Acute upper respiratory infection, unspecified: Secondary | ICD-10-CM

## 2022-05-23 DIAGNOSIS — B9689 Other specified bacterial agents as the cause of diseases classified elsewhere: Secondary | ICD-10-CM | POA: Diagnosis not present

## 2022-05-23 MED ORDER — PSEUDOEPH-BROMPHEN-DM 30-2-10 MG/5ML PO SYRP: 5.0000 mL | ORAL_SOLUTION | Freq: Four times a day (QID) | ORAL | 0 refills | Status: DC | PRN

## 2022-05-23 MED ORDER — BENZONATATE 100 MG PO CAPS: 100.0000 mg | ORAL_CAPSULE | Freq: Three times a day (TID) | ORAL | 0 refills | Status: DC | PRN

## 2022-05-23 MED ORDER — AMOXICILLIN-POT CLAVULANATE 875-125 MG PO TABS: 1.0000 | ORAL_TABLET | Freq: Two times a day (BID) | ORAL | 0 refills | Status: DC

## 2022-05-23 NOTE — Patient Instructions (Signed)
Susan Macias, thank you for joining Margaretann Loveless, PA-C for today's virtual visit.  While this provider is not your primary care provider (PCP), if your PCP is located in our provider database this encounter information will be shared with them immediately following your visit.  Consent: (Patient) Susan Macias provided verbal consent for this virtual visit at the beginning of the encounter.  Current Medications:  Current Outpatient Medications:    amoxicillin-clavulanate (AUGMENTIN) 875-125 MG tablet, Take 1 tablet by mouth 2 (two) times daily., Disp: 20 tablet, Rfl: 0   benzonatate (TESSALON) 100 MG capsule, Take 1 capsule (100 mg total) by mouth 3 (three) times daily as needed., Disp: 30 capsule, Rfl: 0   brompheniramine-pseudoephedrine-DM 30-2-10 MG/5ML syrup, Take 5 mLs by mouth 4 (four) times daily as needed., Disp: 120 mL, Rfl: 0   amitriptyline (ELAVIL) 75 MG tablet, Take 1 tablet (75 mg total) by mouth at bedtime., Disp: 90 tablet, Rfl: 2   atorvastatin (LIPITOR) 80 MG tablet, Take 1 tablet (80 mg total) by mouth daily., Disp: 90 tablet, Rfl: 1   calcium-vitamin D 250-100 MG-UNIT tablet, Take 1 tablet by mouth 2 (two) times daily., Disp: , Rfl:    clopidogrel (PLAVIX) 75 MG tablet, TAKE 1 TABLET BY MOUTH EVERY DAY WITH BREAKFAST, Disp: 90 tablet, Rfl: 0   [employer] ALLERGY 25 MG capsule, TAKE 1 CAPSULE BY MOUTH EVERY 6 HOURS AS NEEDED, Disp: 48 capsule, Rfl: 3   cyclobenzaprine (FLEXERIL) 10 MG tablet, TAKE 1 TABLET BY MOUTH TWICE A DAY AS NEEDED FOR MUSCLE SPASMS, Disp: 180 tablet, Rfl: 1   LINZESS 145 MCG CAPS capsule, Take 1 capsule (145 mcg total) by mouth daily before breakfast., Disp: 90 capsule, Rfl: 1   lisinopril (ZESTRIL) 5 MG tablet, TAKE 1 TABLET (5 MG TOTAL) BY MOUTH DAILY., Disp: 90 tablet, Rfl: 1   metoprolol tartrate (LOPRESSOR) 50 MG tablet, Take 1 tablet (50 mg total) by mouth 2 (two) times daily., Disp: 180 tablet, Rfl: 1   nitroGLYCERIN (NITROSTAT) 0.4 MG SL  tablet, TAKE 1 TABLET BY MOUTH UNDER TONGUE EVERY 5 MIN AS NEEDED FOR CHEST PAIN, Disp: 25 tablet, Rfl: 0   pantoprazole (PROTONIX) 40 MG tablet, Take 1 tablet (40 mg total) by mouth daily before breakfast., Disp: 90 tablet, Rfl: 1   polyethylene glycol powder (GLYCOLAX/MIRALAX) 17 GM/SCOOP powder, Take 17 g by mouth daily as needed., Disp: 255 g, Rfl: 1   pregabalin (LYRICA) 150 MG capsule, TAKE 1 CAPSULE BY MOUTH 3 TIMES DAILY., Disp: 90 capsule, Rfl: 2   valACYclovir (VALTREX) 1000 MG tablet, Take 1 tablet (1,000 mg total) by mouth 2 (two) times daily. 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    Dispense:  20 tablet    Refill:  0    Order Specific Question:   Supervising Provider    Answer:   Hyacinth Meeker, BRIAN [[id number]]   brompheniramine-pseudoephedrine-DM 30-2-10 MG/5ML syrup    Sig: Take 5 mLs by mouth 4 (four) times daily as needed.    Dispense:  120 mL    Refill:  0    Order Specific Question:   Supervising Provider    Answer:   MILLER, BRIAN [[id number]]   benzonatate (TESSALON) 100 MG capsule  Sig: Take 1 capsule (100 mg total) by mouth 3 (three) times daily as needed.    Dispense:  30 capsule    Refill:  0    Order Specific Question:   Supervising Provider    Answer:   Hyacinth Meeker, BRIAN [[id number]]     *If you need refills on other medications prior to your next appointment, please contact your pharmacy*  Follow-Up: Call back or seek an in-person evaluation if the symptoms worsen or if the condition fails to improve as anticipated.  Other Instructions Upper Respiratory Infection, Adult An upper respiratory infection (URI) affects the nose, throat, and upper airways that lead to the lungs. The most common type of URI is often called the common cold. URIs usually get better on  their own, without medical treatment. What are the causes? A URI is caused by a germ (virus). You may catch these germs by: Breathing in droplets from an infected person's cough or sneeze. Touching something that has the germ on it (is contaminated) and then touching your mouth, nose, or eyes. What increases the risk? You are more likely to get a URI if: You are very young or very old. You have close contact with others, such as at work, school, or a health care facility. You smoke. You have long-term (chronic) heart or lung disease. You have a weakened disease-fighting system (immune system). You have nasal allergies or asthma. You have a lot of stress. You have poor nutrition. What are the signs or symptoms? Runny or stuffy (congested) nose. Cough. Sneezing. Sore throat. Headache. Feeling tired (fatigue). Fever. Not wanting to eat as much as usual. Pain in your forehead, behind your eyes, and over your cheekbones (sinus pain). Muscle aches. Redness or irritation of the eyes. Pressure in the ears or face. How is this treated? URIs usually get better on their own within 7-10 days. Medicines cannot cure URIs, but your doctor may recommend certain medicines to help relieve symptoms, such as: Over-the-counter cold medicines. Medicines to reduce coughing (cough suppressants). Coughing is a type of defense against infection that helps to clear the nose, throat, windpipe, and lungs (respiratory system). Take these medicines only as told by your doctor. Medicines to lower your fever. Follow these instructions at home: Activity Rest as needed. If you have a fever, stay home from work or school until your fever is gone, or until your doctor says you may return to work or school. You should stay home until you cannot spread the infection anymore (you are not contagious). Your doctor may have you wear a face mask so you have less risk of spreading the infection. Relieving symptoms Rinse  your mouth often with salt water. To make salt water, dissolve -1 tsp (3-6 g) of salt in 1 cup (237 mL) of warm water. Use a cool-mist humidifier to add moisture to the air. This can help you breathe more easily. Eating and drinking  Drink enough fluid to keep your pee (urine) pale yellow. Eat soups and other clear broths. General instructions  Take over-the-counter and prescription medicines only as told by your doctor. Do not smoke or use any products that contain nicotine or tobacco. If you need help quitting, ask your doctor. Avoid being where people are smoking (avoid secondhand smoke). Stay up to date on all your shots (immunizations), and get the flu shot every year. Keep all follow-up visits. How to prevent the spread of infection to others  Wash your hands with soap and water for at least  20 seconds. If you cannot use soap and water, use hand sanitizer. Avoid touching your mouth, face, eyes, or nose. Cough or sneeze into a tissue or your sleeve or elbow. Do not cough or sneeze into your hand or into the air. Contact a doctor if: You are getting worse, not better. You have any of these: A fever or chills. Brown or red mucus in your nose. Yellow or brown fluid (discharge)coming from your nose. Pain in your face, especially when you bend forward. Swollen neck glands. Pain when you swallow. White areas in the back of your throat. Get help right away if: You have shortness of breath that gets worse. You have very bad or constant: Headache. Ear pain. Pain in your forehead, behind your eyes, and over your cheekbones (sinus pain). Chest pain. You have long-lasting (chronic) lung disease along with any of these: Making high-pitched whistling sounds when you breathe, most often when you breathe out (wheezing). Long-lasting cough (more than 14 days). Coughing up blood. A change in your usual mucus. You have a stiff neck. You have changes in  your: Vision. Hearing. Thinking. Mood. These symptoms may be an emergency. Get help right away. Call 911. Do not wait to see if the symptoms will go away. Do not drive yourself to the hospital. Summary An upper respiratory infection (URI) is caused by a germ (virus). The most common type of URI is often called the common cold. URIs usually get better within 7-10 days. Take over-the-counter and prescription medicines only as told by your doctor. This information is not intended to replace advice given to you by your health care provider. Make sure you discuss any questions you have with your health care provider. Document Revised: 06/20/2021 Document Reviewed: 06/20/2021 Elsevier Patient Education  2023 Elsevier Inc.    If you have been instructed to have an in-person evaluation today at a local Urgent Care facility, please use the link below. It will take you to a list of all of our available [facility], including address, phone number and hours of operation. Please do not delay care.  [facility]  If you or a family member do not have a primary care provider, use the link below to schedule a visit and establish care. When you choose a [facility] primary care physician or advanced practice provider, you gain a long-term partner in health. Find a Primary Care Provider  Learn more about [facility]'s in-office and virtual care options:  - Get Care Now

## 2022-05-23 NOTE — Telephone Encounter (Signed)
Summary: cold symptoms   Pt stated she has a cold and her lymph nodes behind her ears are swollen / she asked if an antibiotic can be sent to pharmacy for her / she didn't want to come in office with this cold / she also has a cough with phlegm and did have a fever but not as bad anymore / she also mentioned having headaches      Chief Complaint: cough with yellow green phlegm,  Symptoms: headache, swollen lymph nodes, cold sx, fever Frequency: last week Pertinent Negatives: Patient denies SOB, chest pain, wheezing,runny nose Disposition: [] ED /[] Urgent Care (no appt availability in office) / [] Appointment(In office/virtual)/ [x]  [facility]/ [] Home Care/ [] Refused Recommended Disposition /[] [facility]/ []  Follow-up with PCP Additional Notes: made appt for pt this am at 0930.  Reason for Disposition  [1] Continuous (nonstop) coughing interferes with work or school AND [2] no improvement using cough treatment per Care Advice  Answer Assessment - Initial Assessment Questions 1. ONSET: "When did the cough begin?"      Last week 2. SEVERITY: "How bad is the cough today?"      Coughing often and congested cough 3. SPUTUM: "Describe the color of your sputum" (none, dry cough; clear, white, yellow, green)     Yellow- greenish 4. HEMOPTYSIS: "Are you coughing up any blood?" If so ask: "How much?" (flecks, streaks, tablespoons, etc.)     no 5. DIFFICULTY BREATHING: "Are you having difficulty breathing?" If Yes, ask: "How bad is it?" (e.g., mild, moderate, severe)    - MILD: No SOB at rest, mild SOB with walking, speaks normally in sentences, can lie down, no retractions, pulse < 100.    - MODERATE: SOB at rest, SOB with minimal exertion and prefers to sit, cannot lie down flat, speaks in phrases, mild retractions, audible wheezing, pulse 100-120.    - SEVERE: Very SOB at rest, speaks in single words, struggling to breathe, sitting hunched forward, retractions, pulse > 120       no 6. FEVER: "Do you have a fever?" If Yes, ask: "What is your temperature, how was it measured, and when did it start?"     Yes- 100- started 3 days 7. CARDIAC HISTORY: "Do you have any history of heart disease?" (e.g., heart attack, congestive heart failure)      Stent, heart meds,  8. LUNG HISTORY: "Do you have any history of lung disease?"  (e.g., pulmonary embolus, asthma, emphysema)     no 9. PE RISK FACTORS: "Do you have a history of blood clots?" (or: recent major surgery, recent prolonged travel, bedridden)     no 10. OTHER SYMPTOMS: "Do you have any other symptoms?" (e.g., runny nose, wheezing, chest pain)       Cough, swollen lymph nodes, headache  Protocols used: Cough - Acute Productive-A-AH

## 2022-05-28 NOTE — Telephone Encounter (Signed)
Patient called in asking for fax number, I gave her  fax number because referral from  [facility] is going   to send info over, stating  she  cant have ct scan done in [place].

## 2022-05-29 NOTE — Telephone Encounter (Signed)
Ok I can order CT at other location, but if outside [facility] may require a faxed paper order from the Radiology site that would do the Scan like an Order Form.  SHe needs to tell us where  Susan Pilar, DO [facility] 05/29/2022, 2:02 PM

## 2022-05-31 ENCOUNTER — Ambulatory Visit
Admission: RE | Admit: 2022-05-31 | Discharge: 2022-05-31 | Disposition: A | Discharge status: Home / Self Care | Payer: Medicare Other | Source: Ambulatory Visit | Attending: Family Medicine | Admitting: Family Medicine

## 2022-05-31 DIAGNOSIS — R140 Abdominal distension (gaseous): Secondary | ICD-10-CM | POA: Diagnosis present

## 2022-05-31 DIAGNOSIS — K429 Umbilical hernia without obstruction or gangrene: Secondary | ICD-10-CM | POA: Insufficient documentation

## 2022-05-31 LAB — POCT I-STAT CREATININE: Creatinine, Ser: 1 mg/dL (ref 0.44–1.00)

## 2022-05-31 MED ORDER — IOHEXOL 300 MG/ML  SOLN
100.0000 mL | Freq: Once | INTRAMUSCULAR | Status: AC | PRN
  Administered 2022-05-31: 100 mL via INTRAVENOUS

## 2022-06-07 ENCOUNTER — Encounter: Payer: Self-pay | Admitting: Medical

## 2022-06-07 ENCOUNTER — Ambulatory Visit (INDEPENDENT_AMBULATORY_CARE_PROVIDER_SITE_OTHER): Payer: Medicare Other | Admitting: Medical

## 2022-06-07 VITALS — BP 110/78 | HR 80 | Ht 69.0 in | Wt 248.2 lb

## 2022-06-07 DIAGNOSIS — E782 Mixed hyperlipidemia: Secondary | ICD-10-CM

## 2022-06-07 DIAGNOSIS — R0609 Other forms of dyspnea: Secondary | ICD-10-CM | POA: Diagnosis not present

## 2022-06-07 DIAGNOSIS — I10 Essential (primary) hypertension: Secondary | ICD-10-CM

## 2022-06-07 DIAGNOSIS — Z01810 Encounter for preprocedural cardiovascular examination: Secondary | ICD-10-CM | POA: Diagnosis not present

## 2022-06-07 DIAGNOSIS — I2510 Atherosclerotic heart disease of native coronary artery without angina pectoris: Secondary | ICD-10-CM

## 2022-06-07 MED ORDER — ATORVASTATIN CALCIUM 80 MG PO TABS: 80.0000 mg | ORAL_TABLET | Freq: Every day | ORAL | 1 refills | Status: DC

## 2022-06-07 NOTE — Patient Instructions (Signed)
Medication Instructions:  Your physician recommends that you continue on your current medications as directed. Please refer to the Current Medication list given to you today.  *If you need a refill on your cardiac medications before your next appointment, please call your pharmacy*   Lab Work: Lipid, Cmp, Cbc, Tsh today  Please have your labs drawn at the [facility]. Stop at the Registration desk to check in.  If you have labs (blood work) drawn today and your tests are completely normal, you will receive your results only by: MyChart Message (if you have MyChart) OR A paper copy in the mail If you have any lab test that is abnormal or we need to change your treatment, we will call you to review the results.   Testing/Procedures: Your physician has requested that you have an echocardiogram. Echocardiography is a painless test that uses sound waves to create images of your heart. It provides your doctor with information about the size and shape of your heart and how well your heart's chambers and valves are working. This procedure takes approximately one hour. There are no restrictions for this procedure.    Follow-Up: At [facility], you and your health needs are our priority.  As part of our continuing mission to provide you with exceptional heart care, we have created designated Provider Care Teams.  These Care Teams include your primary Cardiologist (physician) and Advanced Practice Providers (APPs -  Physician Assistants and Nurse Practitioners) who all work together to provide you with the care you need, when you need it.  We recommend signing up for the patient portal called "MyChart".  Sign up information is provided on this After Visit Summary.  MyChart is used to connect with patients for Virtual Visits (Telemedicine).  Patients are able to view lab/test results, encounter notes, upcoming appointments, etc.  Non-urgent messages can be sent to your provider as well.   To  learn more about what you can do with MyChart, go to [email or web address].    Your next appointment:   Your physician wants you to follow-up in: 1 year You will receive a reminder letter in the mail two months in advance. If you don't receive a letter, please call our office to schedule the follow-up appointment.   The format for your next appointment:   In Person  Provider:   You may see Julien Nordmann, MD or one of the following Advanced Practice Providers on your designated Care Team:   Nicolasa Ducking, NP Eula Listen, PA-C Cadence Fransico Michael, [name]  Other Instructions N/A  Important Information About [facility]
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
## 2022-06-13 ENCOUNTER — Other Ambulatory Visit: Payer: Self-pay | Admitting: Family Medicine

## 2022-06-13 DIAGNOSIS — K219 Gastro-esophageal reflux disease without esophagitis: Secondary | ICD-10-CM

## 2022-06-13 NOTE — Telephone Encounter (Signed)
Requested Prescriptions  Pending Prescriptions Disp Refills  . pantoprazole (PROTONIX) 40 MG tablet [Pharmacy Med Name: PANTOPRAZOLE SOD DR 40 MG TAB] 90 tablet 1    Sig: TAKE 1 TABLET BY MOUTH DAILY BEFORE BREAKFAST     Gastroenterology: Proton Pump Inhibitors Passed - 06/13/2022  2:27 PM      Passed - Valid encounter within last 12 months    Recent Outpatient Visits          2 months ago Abdominal bloating   [facility] Reynolds, [name], DO   5 months ago Fibromyalgia   [facility] Smitty Cords, DO   9 months ago Upper respiratory tract infection, unspecified type   [facility] Dalton, La [place], DO   2 years ago Acute cystitis with hematuria   [facility] Ruby, Ald[place], DO   2 years ago Recurrent falls   [facility] [place], Oralia Rud, DO      Future Appointments            In 1 month Althea Charon, [name], DO [facility]

## 2022-06-27 ENCOUNTER — Other Ambulatory Visit: Payer: Self-pay

## 2022-06-27 ENCOUNTER — Telehealth: Payer: Self-pay | Admitting: Cardiovascular Disease

## 2022-06-27 DIAGNOSIS — Z1211 Encounter for screening for malignant neoplasm of colon: Secondary | ICD-10-CM

## 2022-06-27 NOTE — Telephone Encounter (Signed)
   Pre-operative Risk Assessment    Patient Name: Susan Macias  DOB: [birth year] MRN: [id number]      Request for Surgical Clearance    Procedure:   Colonoscopy   Date of Surgery:  Clearance 08/15/22                                 Surgeon:  not indicated  Surgeon's Group or Practice Name:  [facility]  Phone number:  [phone] Fax number:  [phone]   Type of Clearance Requested:   - Pharmacy:  Hold Clopidogrel (Plavix) instructions    Type of Anesthesia:  General    Additional requests/questions:    Courtney Heys   06/27/2022, 3:04 PM

## 2022-06-28 ENCOUNTER — Telehealth: Payer: Medicare Other | Admitting: Physician Assistant

## 2022-06-28 ENCOUNTER — Other Ambulatory Visit: Payer: Medicare Other

## 2022-06-28 DIAGNOSIS — J019 Acute sinusitis, unspecified: Secondary | ICD-10-CM | POA: Diagnosis not present

## 2022-06-28 DIAGNOSIS — B9689 Other specified bacterial agents as the cause of diseases classified elsewhere: Secondary | ICD-10-CM | POA: Diagnosis not present

## 2022-06-28 MED ORDER — AMOXICILLIN-POT CLAVULANATE 875-125 MG PO TABS: 1.0000 | ORAL_TABLET | Freq: Two times a day (BID) | ORAL | 0 refills | Status: DC

## 2022-06-28 NOTE — Progress Notes (Signed)

## 2022-07-02 ENCOUNTER — Ambulatory Visit: Payer: Medicare Other | Admitting: Family Medicine

## 2022-07-02 ENCOUNTER — Ambulatory Visit: Payer: Self-pay

## 2022-07-02 NOTE — Telephone Encounter (Signed)
  Chief Complaint: diarrhea Symptoms: diarrhea x 2, bloating, nausea, abdominal pain 3/10 Frequency: yesterday and today Pertinent Negatives: Patient denies vomiting Disposition: [] ED /[] Urgent Care (no appt availability in office) / [] Appointment(In office/virtual)/ []  [facility]/ [x] Home Care/ [] Refused Recommended Disposition /[] Central Mobile Bus/ []  Follow-up with PCP Additional Notes: pt states she took Miralax and Linzess yesterday and just feels like to much at once hit her causing diarrhea. Pt was explaining about her lymph node issue and having colonoscopy coming up. Pt has GI provider that she is seeing as well. She is also taking Amoxicillin for URI. Advised pt to continue abx even tho can cause diarrhea as well. Pt states diarrhea is improving but wanted to reschedule appt instead of coming in today.   Summary: diarrhea   Pt has worsening diarrhea and called to cancel her appt for today / she thinks she may have taken too much miralax / please advise / pt moved her appt to 8.14.23      Reason for Disposition  [1] MILD diarrhea AND [2] taking antibiotics  Answer Assessment - Initial Assessment Questions 1. DIARRHEA SEVERITY: "How bad is the diarrhea?" "How many more stools have you had in the past 24 hours than normal?"    - NO DIARRHEA (SCALE 0)   - MILD (SCALE 1-3): Few loose or mushy BMs; increase of 1-3 stools over normal daily number of stools; mild increase in ostomy output.   -  MODERATE (SCALE 4-7): Increase of 4-6 stools daily over normal; moderate increase in ostomy output.   -  SEVERE (SCALE 8-10; OR "WORST POSSIBLE"): Increase of 7 or more stools daily over normal; moderate increase in ostomy output; incontinence.     2 2. ONSET: "When did the diarrhea begin?"      Yesterday and today  5. ABDOMEN PAIN: "Are you having any abdomen pain?" If Yes, ask: "What does it feel like?" (e.g., crampy, dull, intermittent, constant)      Crampy  6. ABDOMEN PAIN  SEVERITY: If present, ask: "How bad is the pain?"  (e.g., Scale 1-10; mild, moderate, or severe)   - MILD (1-3): doesn't interfere with normal activities, abdomen soft and not tender to touch    - MODERATE (4-7): interferes with normal activities or awakens from sleep, abdomen tender to touch    - SEVERE (8-10): excruciating pain, doubled over, unable to do any normal activities       3-4 10. ANTIBIOTIC USE: "Are you taking antibiotics now or have you taken antibiotics in the past 2 months?"       Amoxicillin for URI  11. OTHER SYMPTOMS: "Do you have any other symptoms?" (e.g., fever, blood in stool)       Bloating, nausea  Protocols used: Diarrhea-A-AH

## 2022-07-11 NOTE — Telephone Encounter (Addendum)
   Patient Name: Susan Macias  DOB: Aug 04, 1971 MRN: [id number]  Primary Cardiologist: Dr. Mariah Milling  Chart reviewed as part of pre-operative protocol coverage. Patient was already seen 06/07/22 by Cadence Fransico Michael PA-C for pre-op cardiovascular examination at which time further testing is planned. Per office protocol, the provider who saw the patient should forward their finalized clearance decision to requesting party below upon completion of testing. Will route to Cadence so she is aware of the number to fax to.  I will route this message to requesting party so they're aware that final recommendations are forthcoming from evaluating provider. I remove this message from the pre-op box as separate preop APP input is not needed at this time.  Laurann Montana, PA-C 07/11/2022, 8:04 AM

## 2022-07-15 ENCOUNTER — Encounter: Payer: Self-pay | Admitting: Family Medicine

## 2022-07-15 ENCOUNTER — Ambulatory Visit (INDEPENDENT_AMBULATORY_CARE_PROVIDER_SITE_OTHER): Payer: Medicare Other | Admitting: Family Medicine

## 2022-07-15 ENCOUNTER — Ambulatory Visit: Payer: Medicare Other | Admitting: Family Medicine

## 2022-07-15 ENCOUNTER — Telehealth: Payer: Self-pay

## 2022-07-15 VITALS — BP 130/83 | HR 78 | Ht 69.0 in | Wt 248.8 lb

## 2022-07-15 DIAGNOSIS — R140 Abdominal distension (gaseous): Secondary | ICD-10-CM

## 2022-07-15 DIAGNOSIS — Z1211 Encounter for screening for malignant neoplasm of colon: Secondary | ICD-10-CM

## 2022-07-15 DIAGNOSIS — K5909 Other constipation: Secondary | ICD-10-CM

## 2022-07-15 DIAGNOSIS — F4323 Adjustment disorder with mixed anxiety and depressed mood: Secondary | ICD-10-CM

## 2022-07-15 DIAGNOSIS — K219 Gastro-esophageal reflux disease without esophagitis: Secondary | ICD-10-CM

## 2022-07-15 DIAGNOSIS — F5104 Psychophysiologic insomnia: Secondary | ICD-10-CM | POA: Diagnosis not present

## 2022-07-15 DIAGNOSIS — F411 Generalized anxiety disorder: Secondary | ICD-10-CM

## 2022-07-15 MED ORDER — ESCITALOPRAM OXALATE 10 MG PO TABS: 10.0000 mg | ORAL_TABLET | Freq: Every day | ORAL | 2 refills | Status: DC

## 2022-07-15 MED ORDER — TRAZODONE HCL 50 MG PO TABS: 25.0000 mg | ORAL_TABLET | Freq: Every day | ORAL | 2 refills | Status: DC

## 2022-07-15 NOTE — Patient Instructions (Addendum)
Thank you for coming to the office today.  I would recommend consult with the Gastroenterology GI doctor first, about the stomach acid symptoms. And they may choose to do the upper endoscopy as well as the colonoscopy.  I will send them a message.  Start Lexapro generic Escitalopram 10mg  daily for mood/anxiety.  Also start Trazodone 50mg  nightly for sleep and mood.  May take a few weeks to take full effect, keep in touch if not improving  These offices have both PSYCHIATRY doctors and (Virtual Available) Maria Antonia Gratiot [address] [place], [address] [place] Phone: [phone]  [facility] Address: [address], [place], [address] [place] [email or web address] Phone: [phone]  [facility]) Address: [address], [place], [address] [place] Hours: 8:30AM-5PM Phone: [phone]  [facility] (Adult, Peds, Geriatric, Counseling) [address] [place], [address] [place] Phone: [phone] Fax: [phone]  [facility] [address] [place], [address] [place] Phone: [phone]  [facility] (All ages) [address], [facility] [address] [place] Ervin Knack, [place] Phone: [phone] (Option 1) [email or web address]  ----------------------------------------------------------------- THERAPIST ONLY  (No Psychiatry)  [facility] [address] [place], [address] [place] P: [phone]  [facility]) [facility] Through [facility], [employer] [address] [place], [address] [place] [phone]  [facility]   Address: [address] [place], [address] [place] Hours: Open today  9AM-7PM Phone: [phone]  [facility] [address], [facility]  - [facility] Address: [address] [facility]  [place], Leonard Schwartz [place] Phone: [phone]    Please schedule a Follow-up Appointment to: Return in about 3 months (around 10/15/2022) for 3 month follow-up Mood/Anxiety PHQGAD.  If you have any other questions or concerns, please feel free to call the office or send a message through MyChart. You may also schedule an earlier appointment if necessary.  Additionally, you may be receiving a survey about your experience at our office within a few days to 1 week by e-mail or mail. We value your feedback.  [phone], DO [facility], Saralyn Pilar

## 2022-07-15 NOTE — Telephone Encounter (Signed)
Called patient and got her in to see Dr. Allegra Lai on 09/05/2022. Told her we would cancel colonoscopy that is schedule for 08/15/2022 and reschedule it at appointment. Called ENDO and got patient canceled talk to Dava

## 2022-07-15 NOTE — Progress Notes (Signed)
Subjective:    Patient ID: Susan Macias, female    DOB: Nov 10, 1971, 51 y.o.   MRN: [id number]  Susan Macias is a 51 y.o. female presenting on 07/15/2022 for Anxiety and Gastroesophageal Reflux   HPI  Abdominal Bloating, Gas, Abdominal Distention Increased gas production bloating, and reduced appetite, feels full early, difficulty with laying on left side. Left side increased bloating Admits some increased gas and nausea and constipation Tried multiple treatment options limited results Has been on Linzess w/o success as well Takes Miralax PRN  CHRONIC HTN: Reports normally BP 120/80 range, sometimes higher if in pain up to 140/90 Improved walking more Current Meds - Lisinopril 5mg  took today Tolerating well, w/o complaints. Denies CP, dyspnea, HA, edema, dizziness / lightheadedness  She has upcoming [facility] upcoming Dr - 08-24-2022  Anxiety Grandson died in Aug 23, 2022due to fentanyl overdose. She took custody of her daughter's 5 children and she is pregnant. She is caring for them. Major anxiety and stressors with this situation. Asking about medication assistance   Chronic Pain Syndrome Established with [facility] Upcoming updates, they will consider injection therapy       07/15/2022    2:36 PM 01/24/2022    2:29 PM 01/14/2022    3:15 PM  Depression screen PHQ 2/9  Decreased Interest 1 0 1  Down, Depressed, Hopeless 2 0 1  PHQ - 2 Score 3 0 2  Altered sleeping 3 3 3   Tired, decreased energy 1 0 2  Change in appetite 1 2 3   Feeling bad or failure about yourself  0 0 0  Trouble concentrating 0 2 2  Moving slowly or fidgety/restless 0 0 0  Suicidal thoughts 0 0 0  PHQ-9 Score 8 7 12   Difficult doing work/chores Somewhat difficult Somewhat difficult Somewhat difficult    Social History   Tobacco Use   Smoking status: Former    Packs/day: 1.00    Years: 20.00    Total pack years: 20.00    Types: Cigarettes    Quit date: 06/17/2017     Years since quitting: 5.0   Smokeless tobacco: Never   Tobacco comments:    Quit July 17th 2018  Vaping Use   Vaping Use: Never used  Substance Use Topics   Alcohol use: No   Drug use: Not Currently    Types: Marijuana    Review of Systems Per HPI unless specifically indicated above     Objective:    BP 130/83   Pulse 78   Ht 5\' 9"  (1.753 m)   Wt 248 lb 12.8 oz (112.9 kg)   SpO2 99%   BMI 36.74 kg/m   Wt Readings from Last 3 Encounters:  07/15/22 248 lb 12.8 oz (112.9 kg)  06/07/22 248 lb 3.2 oz (112.6 kg)  04/12/22 243 lb 12.8 oz (110.6 kg)    Physical Exam Vitals and nursing note reviewed.  Constitutional:      General: She is not in acute distress.    Appearance: Normal appearance. She is well-developed. She is not diaphoretic.     Comments: Well-appearing, comfortable, cooperative  HENT:     Head: Normocephalic and atraumatic.  Eyes:     General:        Right eye: No discharge.        Left eye: No discharge.     Conjunctiva/sclera: Conjunctivae normal.  Cardiovascular:     Rate and Rhythm: Normal rate.  Pulmonary:  Effort: Pulmonary effort is normal.  Skin:    General: Skin is warm and dry.     Findings: No erythema or rash.  Neurological:     Mental Status: She is alert and oriented to person, place, and time.  Psychiatric:        Mood and Affect: Mood normal.        Behavior: Behavior normal.        Thought Content: Thought content normal.     Comments: Well groomed, good eye contact, normal speech and thoughts    Results for orders placed or performed during the hospital encounter of 05/31/22  I-STAT creatinine  Result Value Ref Range   Creatinine, Ser 1.00 0.44 - 1.00 mg/dL      Assessment & Plan:   Problem List Items Addressed This Visit     Chronic constipation   Relevant Orders   Ambulatory referral to Gastroenterology   GERD (gastroesophageal reflux disease)   Relevant Orders   Ambulatory referral to Gastroenterology    Insomnia   Relevant Medications   traZODone (DESYREL) 50 MG tablet   Other Visit Diagnoses     Adjustment disorder with mixed anxiety and depressed mood    -  Primary   Relevant Medications   escitalopram (LEXAPRO) 10 MG tablet   GAD (generalized anxiety disorder)       Relevant Medications   escitalopram (LEXAPRO) 10 MG tablet   traZODone (DESYREL) 50 MG tablet   Abdominal bloating       Relevant Orders   Ambulatory referral to Gastroenterology   Colon cancer screening       Relevant Orders   Ambulatory referral to Gastroenterology       chronic problem with GERD, refractory to medication and also has mixture of functional bowel symptoms and chronic constipation, she was scheduled for Colonoscopy 08/2022 but now she request further evaluation consult first on GERD upper symptoms and consider EGD + Colonoscopy.   Selecting urgent referral because in order to arrange she will need to be new patient for scheduling. She will need to cancel the current Colonoscopy and they would arrange visit with GI first prior to re-scheduling. Discussed case with AGI today on Epic Secure Chat.   Referral sent.  Additionally with Anxiety Mixed symptoms with adjustment disorder mood and anxiety Will cover with therapy today for both, med management Start SSRI Escitalopram 10mg  daily, dose instructions Start Trazodone 25-50mg  nightly for sleep, given it has helped before. AVS Handout on therapist/psych mental health resources, she can notify if need referral  Orders Placed This Encounter  Procedures   Ambulatory referral to Gastroenterology    Referral Priority:   Urgent    Referral Type:   Consultation    Referral Reason:   Specialty Services Required    Number of Visits Requested:   1     Meds ordered this encounter  Medications   escitalopram (LEXAPRO) 10 MG tablet    Sig: Take 1 tablet (10 mg total) by mouth daily.    Dispense:  30 tablet    Refill:  2   traZODone (DESYREL) 50 MG  tablet    Sig: Take 0.5-1 tablets (25-50 mg total) by mouth at bedtime.    Dispense:  30 tablet    Refill:  2      Follow up plan: Return in about 3 months (around 10/15/2022) for 3 month follow-up Mood/Anxiety PHQGAD.    10/17/2022, DO [facility] 07/15/2022,  2:26 PM

## 2022-07-15 NOTE — Telephone Encounter (Signed)
Dr. Althea Charon has requested per staff message for patient to be seen by Dr. Allegra Lai in office  "regarding this patient's upcoming Colonoscopy 08/15/22 with Dr Allegra Lai. She is experiencing a lot of issues with uncontrolled GERD on medication and is asking about EGD as well and consult with GI first".  I've informed him that we can cancel her current colonoscopy scheduled for 08/15/22 and see if we can get her scheduled a new patient appointment for Screening colonoscopy and uncontrolled GERD.  I also informed him that at this time our new patient appts are not being scheduled until December but if she needs to be seen sooner to send a new referral mark it as Urgent and I will see if we can get her in sooner than December.  Dr. Enid Skeens stated, "I think it would make sense, to talk to the doctor first and possibly do both".  Message is being routed to Dr. Allegra Lai and her nurse Morrie Sheldon to determine appt availability.  Thanks, [place], [place]

## 2022-07-20 ENCOUNTER — Other Ambulatory Visit: Payer: Self-pay | Admitting: Family Medicine

## 2022-07-20 DIAGNOSIS — K5909 Other constipation: Secondary | ICD-10-CM

## 2022-07-22 NOTE — Telephone Encounter (Signed)
Requested Prescriptions  Pending Prescriptions Disp Refills  . LINZESS 145 MCG CAPS capsule [Pharmacy Med Name: LINZESS 145 MCG CAPSULE] 90 capsule 1    Sig: TAKE 1 CAPSULE BY MOUTH DAILY BEFORE BREAKFAST.     Gastroenterology: Irritable Bowel Syndrome Passed - 07/20/2022  1:25 AM      Passed - Valid encounter within last 12 months    Recent Outpatient Visits          1 week ago Adjustment disorder with mixed anxiety and depressed mood   [facility] [place], [name], DO   3 months ago Abdominal bloating   [facility] Smitty Cords, DO   6 months ago Fibromyalgia   [facility] Smitty Cords, DO   11 months ago Upper respiratory tract infection, unspecified type   [facility] Washam, [place], DO   2 years ago Acute cystitis with hematuria   [facility] [name], DO      Future Appointments            In 2 months Althea Charon, [name], DO [facility], [facility]

## 2022-07-30 ENCOUNTER — Ambulatory Visit: Payer: Medicare Other

## 2022-08-06 ENCOUNTER — Other Ambulatory Visit: Payer: Self-pay | Admitting: Family Medicine

## 2022-08-06 DIAGNOSIS — F411 Generalized anxiety disorder: Secondary | ICD-10-CM

## 2022-08-06 DIAGNOSIS — F5104 Psychophysiologic insomnia: Secondary | ICD-10-CM

## 2022-08-06 DIAGNOSIS — F4323 Adjustment disorder with mixed anxiety and depressed mood: Secondary | ICD-10-CM

## 2022-08-07 NOTE — Telephone Encounter (Signed)
Requested medications are due for refill today. no  Requested medications are on the active medications list.  yes  Last refill. Both refilled 07/15/2022 #30 2 refills  Future visit scheduled.   yes  Notes to clinic.  Pharmacy is requesting 90 day supplies. Pharmacy needs Dx codes.     Requested Prescriptions  Pending Prescriptions Disp Refills   escitalopram (LEXAPRO) 10 MG tablet [Pharmacy Med Name: ESCITALOPRAM 10 MG TABLET] 90 tablet 1    Sig: TAKE 1 TABLET BY MOUTH EVERY DAY     Psychiatry:  Antidepressants - SSRI Passed - 08/06/2022  1:34 PM      Passed - Completed PHQ-2 or PHQ-9 in the last 360 days      Passed - Valid encounter within last 6 months    Recent Outpatient Visits           3 weeks ago Adjustment disorder with mixed anxiety and depressed mood   [facility], [name], DO   3 months ago Abdominal bloating   [facility] [place], [name], DO   6 months ago Fibromyalgia   [facility] Smitty Cords, DO   11 months ago Upper respiratory tract infection, unspecified type   [facility] Valle, [place], DO   2 years ago Acute cystitis with hematuria   [facility] [name], DO       Future Appointments             In 2 months [place], [name], DO [facility], [facility]             traZODone (DESYREL) 50 MG tablet [Pharmacy Med Name: TRAZODONE 50 MG TABLET] 90 tablet 1    Sig: TAKE 1/2 TO 1 TABLET (25 TO 50 MG TOTAL) BY MOUTH AT BEDTIME     Psychiatry: Antidepressants - Serotonin Modulator Passed - 08/06/2022  1:34 PM      Passed - Completed PHQ-2 or PHQ-9 in the last 360 days      Passed - Valid encounter within last 6 months    Recent Outpatient Visits           3 weeks ago Adjustment disorder with mixed anxiety and depressed mood   [facility] [place], [name], DO   3 months ago Abdominal  bloating   [facility] [place], [name], DO   6 months ago Fibromyalgia   [facility] Smitty Cords, DO   11 months ago Upper respiratory tract infection, unspecified type   [facility] [place], [place], DO   2 years ago Acute cystitis with hematuria   [facility] [name], DO       Future Appointments             In 2 months Althea Charon, [name], DO [facility], [facility]

## 2022-08-15 ENCOUNTER — Ambulatory Visit: Admit: 2022-08-15 | Payer: Medicare Other | Admitting: Gastroenterology

## 2022-08-15 SURGERY — COLONOSCOPY WITH PROPOFOL
Anesthesia: General

## 2022-08-29 ENCOUNTER — Other Ambulatory Visit: Payer: Self-pay | Admitting: Family Medicine

## 2022-08-29 ENCOUNTER — Other Ambulatory Visit: Payer: Self-pay | Admitting: Cardiovascular Disease

## 2022-08-29 DIAGNOSIS — K219 Gastro-esophageal reflux disease without esophagitis: Secondary | ICD-10-CM

## 2022-08-29 DIAGNOSIS — I10 Essential (primary) hypertension: Secondary | ICD-10-CM

## 2022-08-29 NOTE — Telephone Encounter (Signed)
Requested Prescriptions  Pending Prescriptions Disp Refills  . metoprolol tartrate (LOPRESSOR) 50 MG tablet [Pharmacy Med Name: METOPROLOL TARTRATE 50 MG TAB] 180 tablet 1    Sig: TAKE 1 TABLET BY MOUTH TWICE A DAY     Cardiovascular:  Beta Blockers Passed - 08/29/2022  2:19 AM      Passed - Last BP in normal range    BP Readings from Last 1 Encounters:  07/15/22 130/83         Passed - Last Heart Rate in normal range    Pulse Readings from Last 1 Encounters:  07/15/22 78         Passed - Valid encounter within last 6 months    Recent Outpatient Visits          1 month ago Adjustment disorder with mixed anxiety and depressed mood   [place], DO   4 months ago Abdominal bloating   [place], DO   7 months ago [place], DO   1 year ago Upper respiratory tract infection, unspecified type   [place], [name], DO   2 years ago Acute cystitis with hematuria   [facility] Valerie Roys, DO      Future Appointments            In 1 month [place], Devonne Doughty, [facility], [facility]

## 2022-08-29 NOTE — Telephone Encounter (Signed)
Requested Prescriptions  Pending Prescriptions Disp Refills  . pantoprazole (PROTONIX) 40 MG tablet [Pharmacy Med Name: PANTOPRAZOLE SOD DR 40 MG TAB] 90 tablet 0    Sig: TAKE 1 TABLET BY MOUTH EVERY DAY BEFORE BREAKFAST     Gastroenterology: Proton Pump Inhibitors Passed - 08/29/2022  9:00 AM      Passed - Valid encounter within last 12 months    Recent Outpatient Visits          1 month ago Adjustment disorder with mixed anxiety and depressed mood   [place], DO   4 months ago Abdominal bloating   [place], DO   7 months ago [place], DO   1 year ago Upper respiratory tract infection, unspecified type   [place], [place], DO   2 years ago Acute cystitis with hematuria   [facility] Valerie Roys, DO      Future Appointments            In 1 month [place], Devonne Doughty, [facility]

## 2022-09-03 ENCOUNTER — Telehealth: Payer: Self-pay | Admitting: *Deleted

## 2022-09-03 NOTE — Telephone Encounter (Signed)
Tried to call patient back but mailbox is full sent Susan Macias

## 2022-09-03 NOTE — Telephone Encounter (Signed)
This call was transferred to my phone. This patient is not schedule for colonoscopy or EGD. This patient is schedule as a new patient to see Dr Marius Ditch on Thursday 09/05/2022.   Patient was calling to reschedule the appointment. Please call patient.

## 2022-09-05 ENCOUNTER — Ambulatory Visit: Payer: Medicare Other | Admitting: Gastroenterology

## 2022-09-16 ENCOUNTER — Ambulatory Visit: Payer: Medicare Other

## 2022-10-06 ENCOUNTER — Other Ambulatory Visit: Payer: Self-pay | Admitting: Internal Medicine

## 2022-10-06 DIAGNOSIS — G894 Chronic pain syndrome: Secondary | ICD-10-CM

## 2022-10-07 NOTE — Telephone Encounter (Signed)
Requested medications are due for refill today.  yes  Requested medications are on the active medications list.  yes  Last refill. 05/20/2022 #90 2 rf  Future visit scheduled.   yes  Notes to clinic.  Refill not delegated.    Requested Prescriptions  Pending Prescriptions Disp Refills   pregabalin (LYRICA) 150 MG capsule [Pharmacy Med Name: PREGABALIN 150 MG CAPSULE] 90 capsule 2    Sig: TAKE 1 CAPSULE BY MOUTH THREE TIMES A DAY     Not Delegated - Neurology:  Anticonvulsants - Controlled - pregabalin Failed - 10/06/2022 12:50 AM      Failed - This refill cannot be delegated      Passed - Cr in normal range and within 360 days    Creatinine  Date Value Ref Range Status  02/17/2014 0.78 0.60 - 1.30 mg/dL Final   Creatinine, Ser  Date Value Ref Range Status  05/31/2022 1.00 0.44 - 1.00 mg/dL Final         Passed - Completed PHQ-2 or PHQ-9 in the last 360 days      Passed - Valid encounter within last 12 months    Recent Outpatient Visits           2 months ago Adjustment disorder with mixed anxiety and depressed mood   [place], DO   5 months ago Abdominal bloating   [place], DO   8 months ago [place], DO   1 year ago Upper respiratory tract infection, unspecified type   [place], [name], DO   2 years ago Acute cystitis with hematuria   [place], DO       Future Appointments             In 1 week [place], Devonne Doughty, [facility], [name]

## 2022-10-16 ENCOUNTER — Ambulatory Visit: Payer: Medicare Other | Admitting: Family Medicine

## 2022-10-18 ENCOUNTER — Other Ambulatory Visit: Payer: Self-pay | Admitting: Family Medicine

## 2022-10-18 DIAGNOSIS — I10 Essential (primary) hypertension: Secondary | ICD-10-CM

## 2022-10-18 DIAGNOSIS — M797 Fibromyalgia: Secondary | ICD-10-CM

## 2022-10-18 DIAGNOSIS — G894 Chronic pain syndrome: Secondary | ICD-10-CM

## 2022-10-18 NOTE — Telephone Encounter (Signed)
Requested medication (s) are due for refill today: yes  Requested medication (s) are on the active medication list: yes  Last refill:  5823/23  Future visit scheduled: yes  Notes to clinic:  Unable to refill per protocol, cannot delegate.      Requested Prescriptions  Pending Prescriptions Disp Refills   cyclobenzaprine (FLEXERIL) 10 MG tablet [Pharmacy Med Name: CYCLOBENZAPRINE 10 MG TABLET] 180 tablet 1    Sig: TAKE 1 TABLET BY MOUTH TWICE A DAY AS NEEDED FOR MUSCLE SPASM     Not Delegated - Analgesics:  Muscle Relaxants Failed - 10/18/2022  2:48 AM      Failed - This refill cannot be delegated      Passed - Valid encounter within last 6 months    Recent Outpatient Visits           3 months ago Adjustment disorder with mixed anxiety and depressed mood   [facility] [place], [name], DO   6 months ago Abdominal bloating   [facility] Smitty Cords, DO   9 months ago Fibromyalgia   [facility] [place], [name], DO   1 year ago Upper respiratory tract infection, unspecified type   [facility] [place], [name], DO   2 years ago Acute cystitis with hematuria   [facility] [name], DO       Future Appointments             In 4 days Althea Charon, [name], DO [facility], [facility]            Signed Prescriptions Disp Refills   lisinopril (ZESTRIL) 5 MG tablet 90 tablet 0    Sig: TAKE 1 TABLET (5 MG TOTAL) BY MOUTH DAILY.     Cardiovascular:  ACE Inhibitors Failed - 10/18/2022  2:48 AM      Failed - K in normal range and within 180 days    Potassium  Date Value Ref Range Status  05/18/2021 4.3 3.5 - 5.2 mmol/L Final  02/17/2014 3.8 3.5 - 5.1 mmol/L Final         Passed - Cr in normal range and within 180 days    Creatinine  Date Value Ref Range Status  02/17/2014 0.78 0.60 - 1.30 mg/dL Final   Creatinine, Ser  Date Value Ref Range  Status  05/31/2022 1.00 0.44 - 1.00 mg/dL Final         Passed - Patient is not pregnant      Passed - Last BP in normal range    BP Readings from Last 1 Encounters:  07/15/22 130/83         Passed - Valid encounter within last 6 months    Recent Outpatient Visits           3 months ago Adjustment disorder with mixed anxiety and depressed mood   [facility] Smitty Cords, DO   6 months ago Abdominal bloating   [facility] Smitty Cords, DO   9 months ago Fibromyalgia   [facility] [place], [name], DO   1 year ago Upper respiratory tract infection, unspecified type   [facility] [place], [name], DO   2 years ago Acute cystitis with hematuria   [facility] [name], DO       Future Appointments  In 4 days Althea Charon, [name], DO [facility], [facility]

## 2022-10-18 NOTE — Telephone Encounter (Signed)
Requested Prescriptions  Pending Prescriptions Disp Refills   cyclobenzaprine (FLEXERIL) 10 MG tablet [Pharmacy Med Name: CYCLOBENZAPRINE 10 MG TABLET] 180 tablet 1    Sig: TAKE 1 TABLET BY MOUTH TWICE A DAY AS NEEDED FOR MUSCLE SPASM     Not Delegated - Analgesics:  Muscle Relaxants Failed - 10/18/2022  2:48 AM      Failed - This refill cannot be delegated      Passed - Valid encounter within last 6 months    Recent Outpatient Visits           3 months ago Adjustment disorder with mixed anxiety and depressed mood   [facility] [place], Susan Neat, DO   6 months ago Abdominal bloating   [facility] Smitty Cords, DO   9 months ago Fibromyalgia   [facility] [place], Susan Neat, DO   1 year ago Upper respiratory tract infection, unspecified type   [facility] [place], Susan P, DO   2 years ago Acute cystitis with hematuria   [facility] [name], DO       Future Appointments             In 4 days Susan Macias, Susan Neat, DO [facility], [facility]             lisinopril (ZESTRIL) 5 MG tablet [Pharmacy Med Name: LISINOPRIL 5 MG TABLET] 90 tablet 1    Sig: TAKE 1 TABLET (5 MG TOTAL) BY MOUTH DAILY.     Cardiovascular:  ACE Inhibitors Failed - 10/18/2022  2:48 AM      Failed - K in normal range and within 180 days    Potassium  Date Value Ref Range Status  05/18/2021 4.3 3.5 - 5.2 mmol/L Final  02/17/2014 3.8 3.5 - 5.1 mmol/L Final         Passed - Cr in normal range and within 180 days    Creatinine  Date Value Ref Range Status  02/17/2014 0.78 0.60 - 1.30 mg/dL Final   Creatinine, Ser  Date Value Ref Range Status  05/31/2022 1.00 0.44 - 1.00 mg/dL Final         Passed - Patient is not pregnant      Passed - Last BP in normal range    BP Readings from Last 1 Encounters:  07/15/22 130/83         Passed - Valid encounter within last 6 months     Recent Outpatient Visits           3 months ago Adjustment disorder with mixed anxiety and depressed mood   [facility] Smitty Cords, DO   6 months ago Abdominal bloating   [facility] Smitty Cords, DO   9 months ago Fibromyalgia   [facility] [place], Susan Neat, DO   1 year ago Upper respiratory tract infection, unspecified type   [facility], Susan P, DO   2 years ago Acute cystitis with hematuria   [facility] [name], DO       Future Appointments             In 4 days Susan Macias, Susan Neat, DO [facility], [facility]

## 2022-10-22 ENCOUNTER — Ambulatory Visit: Payer: Medicare Other | Admitting: Family Medicine

## 2022-10-28 ENCOUNTER — Ambulatory Visit: Payer: Medicare Other | Admitting: Family Medicine

## 2022-10-29 ENCOUNTER — Encounter: Payer: Self-pay | Admitting: Family Medicine

## 2022-10-31 ENCOUNTER — Telehealth: Payer: Medicare Other | Admitting: Emergency Medicine

## 2022-10-31 DIAGNOSIS — N898 Other specified noninflammatory disorders of vagina: Secondary | ICD-10-CM

## 2022-10-31 NOTE — Progress Notes (Signed)
Because you have belly pain and back pain with vaginal discharge, I feel your condition warrants further evaluation and I recommend that you be seen in a face to face visit.   NOTE: There will be NO CHARGE for this eVisit   If you are having a true medical emergency please call 911.      For an urgent face to face visit, [place] has seven urgent care centers for your convenience:     [facility] Directions [phone] [address] [place], [place] [address]    [facility]) Get Driving Directions [phone] [address] [place], [place] [address]  [facility] [place]) Get Driving Directions [phone] [address] [place],  [place]  [address]  [facility] at [employer] Directions  [phone] [address] [place],  [place] [address]   [facility] at [facility] Get Driving Directions [phone] [address], [address] [place], [place] [address]   [facility] at [facility] Get Driving Directions  [phone] [address] [place], [place] [address]   [facility] Directions [phone] [address], [address] [place], [place] [address]  Your MyChart E-visit questionnaire answers were reviewed by a board certified advanced clinical practitioner to complete your personal care plan based on your specific symptoms.  Thank you for using e-Visits.

## 2022-11-05 ENCOUNTER — Ambulatory Visit: Payer: Medicare Other | Attending: Medical

## 2022-11-28 ENCOUNTER — Other Ambulatory Visit: Payer: Self-pay | Admitting: Family Medicine

## 2022-11-28 DIAGNOSIS — K219 Gastro-esophageal reflux disease without esophagitis: Secondary | ICD-10-CM

## 2022-11-28 DIAGNOSIS — I10 Essential (primary) hypertension: Secondary | ICD-10-CM

## 2022-11-29 NOTE — Telephone Encounter (Signed)
Requested Prescriptions  Pending Prescriptions Disp Refills   metoprolol tartrate (LOPRESSOR) 50 MG tablet [Pharmacy Med Name: METOPROLOL TARTRATE 50 MG TAB] 180 tablet 0    Sig: TAKE 1 TABLET BY MOUTH TWICE A DAY     Cardiovascular:  Beta Blockers Passed - 11/28/2022  2:03 AM      Passed - Last BP in normal range    BP Readings from Last 1 Encounters:  07/15/22 130/83         Passed - Last Heart Rate in normal range    Pulse Readings from Last 1 Encounters:  07/15/22 78         Passed - Valid encounter within last 6 months    Recent Outpatient Visits           4 months ago Adjustment disorder with mixed anxiety and depressed mood   [facility] [place], [name], DO   7 months ago Abdominal bloating   [facility] Smitty Cords, DO   10 months ago Fibromyalgia   [facility] [place], [name], DO   1 year ago Upper respiratory tract infection, unspecified type   [facility] [place], [name], DO   2 years ago Acute cystitis with hematuria   [facility] [name], DO       Future Appointments             In 5 days Althea Charon, [name], DO [facility]             pantoprazole (PROTONIX) 40 MG tablet [Pharmacy Med Name: PANTOPRAZOLE SOD DR 40 MG TAB] 90 tablet 0    Sig: TAKE 1 TABLET BY MOUTH EVERY DAY BEFORE BREAKFAST     Gastroenterology: Proton Pump Inhibitors Passed - 11/28/2022  2:03 AM      Passed - Valid encounter within last 12 months    Recent Outpatient Visits           4 months ago Adjustment disorder with mixed anxiety and depressed mood   [facility] [place], [name], DO   7 months ago Abdominal bloating   [facility] Smitty Cords, DO   10 months ago Fibromyalgia   [facility] [place], [name], DO   1 year ago Upper respiratory tract infection,  unspecified type   [facility] [place], [name], DO   2 years ago Acute cystitis with hematuria   [facility] [name], DO       Future Appointments             In 5 days Althea Charon, [name], DO [facility]

## 2022-12-04 ENCOUNTER — Ambulatory Visit: Payer: Medicare Other | Admitting: Family Medicine

## 2023-01-08 ENCOUNTER — Telehealth: Payer: Self-pay

## 2023-01-08 ENCOUNTER — Encounter: Payer: Self-pay | Admitting: Gastroenterology

## 2023-01-08 ENCOUNTER — Ambulatory Visit (INDEPENDENT_AMBULATORY_CARE_PROVIDER_SITE_OTHER): Payer: 59 | Admitting: Gastroenterology

## 2023-01-08 ENCOUNTER — Other Ambulatory Visit: Payer: Self-pay

## 2023-01-08 VITALS — BP 115/77 | HR 88 | Temp 98.1°F | Ht 69.0 in | Wt 242.1 lb

## 2023-01-08 DIAGNOSIS — K219 Gastro-esophageal reflux disease without esophagitis: Secondary | ICD-10-CM | POA: Diagnosis not present

## 2023-01-08 DIAGNOSIS — Z1211 Encounter for screening for malignant neoplasm of colon: Secondary | ICD-10-CM

## 2023-01-08 DIAGNOSIS — K5909 Other constipation: Secondary | ICD-10-CM

## 2023-01-08 MED ORDER — NA SULFATE-K SULFATE-MG SULF 17.5-3.13-1.6 GM/177ML PO SOLN: 354.0000 mL | Freq: Once | ORAL | 0 refills | Status: AC

## 2023-01-08 NOTE — Telephone Encounter (Signed)
   [facility] Pre-operative Risk Assessment    Request for surgical clearance:  What type of surgery is being performed? Colonoscopy and EGD    When is this surgery scheduled? 01/29/2023   Are there any medications that need to be held prior to surgery and how long? Plavix 75mg    Practice name and name of physician performing surgery? [facility]   What is your office phone and fax number? [phone]   Anesthesia type (None, local, MAC, general) ? General    Susan Macias 01/08/2023, 2:34 PM  _________________________________________________________________   (provider comments below)

## 2023-01-08 NOTE — Telephone Encounter (Signed)
   Name: AALYSSA ELDERKIN  DOB: [birth year]  MRN: [id number]  Primary Cardiologist: None  Chart reviewed as part of pre-operative protocol coverage. Because of Amaka Gluth Corson's past medical history and time since last visit, she will require a follow-up in-office visit in order to better assess preoperative cardiovascular risk.  Patient was last seen in 06/2022 and repeat echocardiogram was advised.  She did not have echocardiogram completed and has been seen in follow-up since.  Pre-op covering staff: - Please schedule appointment and call patient to inform them. If patient already had an upcoming appointment within acceptable timeframe, please add "pre-op clearance" to the appointment notes so provider is aware. - Please contact requesting surgeon's office via preferred method (i.e, phone, fax) to inform them of need for appointment prior to surgery.  Lenna Sciara, NP  01/08/2023, 3:50 PM
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
## 2023-01-09 NOTE — Telephone Encounter (Signed)
I was able to reach the pt on her [phone] #, pt states this is her preferred #. Pt agreeable to plan of care for in office appt for pre op clearance. Pt has been scheduled to see Ignacia Bayley, NP 01/15/23 @ 8:50. Pt said she she echo same day and knows that the echo is at 3 pm and the pre op appt with NP is 8:50, pt said she did not care and she is fine with the appts as they are. I will update all parties involved.

## 2023-01-09 NOTE — Telephone Encounter (Signed)
Pt called the GI office about clearance. Pt was informed that she needs to call our office to schedule an IN OFFICE pre op appt. I have tried to reach the pt again but I still cannot get through to the pt.

## 2023-01-09 NOTE — Telephone Encounter (Signed)
Called patient and she states she is calling now to schedule a appointment

## 2023-01-09 NOTE — Telephone Encounter (Addendum)
I tried to reach the pt on all numbers listed for the pt and all numbers were busy. Unable to leave message for the pt to call back to schedule an IN OFFICE appt for pre op clearance. Pt's procedure is set for 01/29/23 and pt needs to hold Plavix.   I will update the requesting office the pt needs appt and we cannot reach the pt.

## 2023-01-13 ENCOUNTER — Telehealth: Payer: Self-pay | Admitting: Nurse Practitioner

## 2023-01-13 NOTE — Telephone Encounter (Signed)
Ew Message:     Patient wants to know f she can do a Pre Op phone visit for her appointment on 01-15-23?

## 2023-01-13 NOTE — Telephone Encounter (Signed)
[place] for virtual visit. If there are any acute concerns an in-person may be needed however.

## 2023-01-14 ENCOUNTER — Other Ambulatory Visit: Payer: Self-pay | Admitting: Family Medicine

## 2023-01-14 DIAGNOSIS — I10 Essential (primary) hypertension: Secondary | ICD-10-CM

## 2023-01-14 NOTE — Telephone Encounter (Signed)
The patient has been made aware and appointment type has been changed. She will have her current vitals ready before the appointment.

## 2023-01-14 NOTE — Telephone Encounter (Signed)
Courtesy refill. Called patient to schedule appt for medication refills. No answer, LVMTCB.  Requested Prescriptions  Pending Prescriptions Disp Refills   lisinopril (ZESTRIL) 5 MG tablet [Pharmacy Med Name: LISINOPRIL 5 MG TABLET] 30 tablet 0    Sig: TAKE 1 TABLET (5 MG TOTAL) BY MOUTH DAILY.     Cardiovascular:  ACE Inhibitors Failed - 01/14/2023  2:21 AM      Failed - Cr in normal range and within 180 days    Creatinine  Date Value Ref Range Status  02/17/2014 0.78 0.60 - 1.30 mg/dL Final   Creatinine, Ser  Date Value Ref Range Status  05/31/2022 1.00 0.44 - 1.00 mg/dL Final         Failed - K in normal range and within 180 days    Potassium  Date Value Ref Range Status  05/18/2021 4.3 3.5 - 5.2 mmol/L Final  02/17/2014 3.8 3.5 - 5.1 mmol/L Final         Failed - Valid encounter within last 6 months    Recent Outpatient Visits           6 months ago Adjustment disorder with mixed anxiety and depressed mood   [place] J, DO   9 months ago Abdominal bloating   [place], DO   1 year ago [place], DO   1 year ago Upper respiratory tract infection, unspecified type   [place], [place] P, DO   2 years ago Acute cystitis with hematuria   [address], [place], [place]              Passed - Patient is not pregnant      Passed - Last BP in normal range    BP Readings from Last 1 Encounters:  01/08/23 115/77

## 2023-01-14 NOTE — Telephone Encounter (Signed)
Called patient to schedule appt for medication refills. No answer, LVMTCB [phone].

## 2023-01-15 ENCOUNTER — Ambulatory Visit: Payer: 59 | Admitting: Nurse Practitioner

## 2023-01-15 ENCOUNTER — Ambulatory Visit: Payer: 59 | Attending: Medical

## 2023-01-15 DIAGNOSIS — R0609 Other forms of dyspnea: Secondary | ICD-10-CM

## 2023-01-15 LAB — ECHOCARDIOGRAM COMPLETE
AR max vel: 3.19 cm2
AV Area VTI: 3.34 cm2
AV Area mean vel: 3.33 cm2
AV Mean grad: 2 mmHg
AV Peak grad: 4.4 mmHg
Ao pk vel: 1.05 m/s
Area-P 1/2: 3.12 cm2
Calc EF: 46.6 %
Single Plane A2C EF: 48.2 %
Single Plane A4C EF: 47.3 %

## 2023-01-16 ENCOUNTER — Other Ambulatory Visit: Payer: Self-pay | Admitting: Family Medicine

## 2023-01-16 ENCOUNTER — Other Ambulatory Visit: Payer: Self-pay | Admitting: Medical

## 2023-01-16 DIAGNOSIS — E782 Mixed hyperlipidemia: Secondary | ICD-10-CM

## 2023-01-16 DIAGNOSIS — F411 Generalized anxiety disorder: Secondary | ICD-10-CM

## 2023-01-16 DIAGNOSIS — F4323 Adjustment disorder with mixed anxiety and depressed mood: Secondary | ICD-10-CM

## 2023-01-16 NOTE — Telephone Encounter (Signed)
Requested medication (s) are due for refill today:   Yes  Requested medication (s) are on the active medication list:   Yes  Future visit scheduled:   No  He has been a No Show for last few appts.    On 01/14/2023 a message was left for him to call for an appt.   Last ordered: 08/07/2022 #90, 1 refill  It's noted that he is not taking this on 01/08/2023.    Also looks like he is establishing with a new provider at [facility] on 02/12/2023  Returned for provider review.   Requested Prescriptions  Pending Prescriptions Disp Refills   escitalopram (LEXAPRO) 10 MG tablet [Pharmacy Med Name: ESCITALOPRAM 10 MG TABLET] 90 tablet 1    Sig: TAKE 1 TABLET BY MOUTH EVERY DAY     Psychiatry:  Antidepressants - SSRI Failed - 01/16/2023  1:38 AM      Failed - Valid encounter within last 6 months    Recent Outpatient Visits           6 months ago Adjustment disorder with mixed anxiety and depressed mood   [place], DO   9 months ago Abdominal bloating   [place], DO   1 year ago [place], DO   1 year ago Upper respiratory tract infection, unspecified type   [place], [name], DO   2 years ago Acute cystitis with hematuria   [place], DO              Passed - Completed PHQ-2 or PHQ-9 in the last 360 days

## 2023-01-17 ENCOUNTER — Other Ambulatory Visit: Payer: Self-pay | Admitting: Cardiovascular Disease

## 2023-01-17 ENCOUNTER — Other Ambulatory Visit: Payer: Self-pay | Admitting: Family Medicine

## 2023-01-17 ENCOUNTER — Encounter: Payer: Self-pay | Admitting: Nurse Practitioner

## 2023-01-17 ENCOUNTER — Ambulatory Visit: Payer: 59 | Attending: Nurse Practitioner | Admitting: Nurse Practitioner

## 2023-01-17 VITALS — BP 118/76 | HR 93 | Ht 69.0 in | Wt 244.0 lb

## 2023-01-17 DIAGNOSIS — Z01810 Encounter for preprocedural cardiovascular examination: Secondary | ICD-10-CM | POA: Diagnosis not present

## 2023-01-17 DIAGNOSIS — I10 Essential (primary) hypertension: Secondary | ICD-10-CM

## 2023-01-17 DIAGNOSIS — G894 Chronic pain syndrome: Secondary | ICD-10-CM

## 2023-01-17 DIAGNOSIS — E782 Mixed hyperlipidemia: Secondary | ICD-10-CM

## 2023-01-17 DIAGNOSIS — F5104 Psychophysiologic insomnia: Secondary | ICD-10-CM

## 2023-01-17 DIAGNOSIS — I2510 Atherosclerotic heart disease of native coronary artery without angina pectoris: Secondary | ICD-10-CM

## 2023-01-17 DIAGNOSIS — M797 Fibromyalgia: Secondary | ICD-10-CM
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
## 2023-01-17 NOTE — Patient Instructions (Addendum)
      Medication Instructions:  Ok to hold plavix for 5 days prior to your colonoscopy/EGD *If you need a refill on your cardiac medications before your next appointment, please call your pharmacy*   Lab Work: None If you have labs (blood work) drawn today and your tests are completely normal, you will receive your results only by: [place] (if you have MyChart) OR A paper copy in the mail If you have any lab test that is abnormal or we need to change your treatment, we will call you to review the results.   Testing/Procedures: None   Follow-Up: At [facility], you and your health needs are our priority.  As part of our continuing mission to provide you with exceptional heart care, we have created designated Provider Care Teams.  These Care Teams include your primary Cardiologist (physician) and Advanced Practice Providers (APPs -  Physician Assistants and Nurse Practitioners) who all work together to provide you with the care you need, when you need it.  We recommend signing up for the patient portal called "MyChart".  Sign up information is provided on this After Visit Summary.  MyChart is used to connect with patients for Virtual Visits (Telemedicine).  Patients are able to view lab/test results, encounter notes, upcoming appointments, etc.  Non-urgent messages can be sent to your provider as well.   To learn more about what you can do with MyChart, go to [email or web address].    Your next appointment:   6 month(s)  Provider:   Ida Rogue, MD or Murray Hodgkins, NP    Other Instructions None

## 2023-01-17 NOTE — Telephone Encounter (Signed)
Unable to refill per protocol, Rx expired. Flexeril was discontinued 01/08/23. Request for trazodone is too soon.  Requested Prescriptions  Pending Prescriptions Disp Refills   traZODone (DESYREL) 50 MG tablet [Pharmacy Med Name: TRAZODONE 50 MG TABLET] 90 tablet 1    Sig: TAKE 1/2 TO 1 TABLET (25 TO 50 MG TOTAL) BY MOUTH AT BEDTIME     Psychiatry: Antidepressants - Serotonin Modulator Failed - 01/17/2023 10:36 AM      Failed - Valid encounter within last 6 months    Recent Outpatient Visits           6 months ago Adjustment disorder with mixed anxiety and depressed mood   [place], DO   9 months ago Abdominal bloating   [place], DO   1 year ago [facility] Olin Hauser, DO   1 year ago Upper respiratory tract infection, unspecified type   [place], [place] P, DO   2 years ago Acute cystitis with hematuria   [facility] [place], [name], DO              Passed - Completed PHQ-2 or PHQ-9 in the last 360 days       cyclobenzaprine (FLEXERIL) 10 MG tablet [Pharmacy Med Name: CYCLOBENZAPRINE 10 MG TABLET] 180 tablet 1    Sig: TAKE 1 TABLET BY MOUTH TWICE A DAY AS NEEDED FOR MUSCLE SPASMS     Not Delegated - Analgesics:  Muscle Relaxants Failed - 01/17/2023 10:36 AM      Failed - This refill cannot be delegated      Failed - Valid encounter within last 6 months    Recent Outpatient Visits           6 months ago Adjustment disorder with mixed anxiety and depressed mood   [facility] [place], DO   9 months ago Abdominal bloating   [place], DO   1 year ago [place], DO   1 year ago Upper  respiratory tract infection, unspecified type   [place], [name], DO   2 years ago Acute cystitis with hematuria   [place], [place], DO

## 2023-01-20 NOTE — Telephone Encounter (Signed)
Called and left a message on primary number and called home number and left a message for call back

## 2023-01-20 NOTE — Telephone Encounter (Signed)
Patient verbalized understanding of instructions  

## 2023-01-20 NOTE — Telephone Encounter (Signed)
Per Cardiology office visit note on 01/15/2023 Advised that she may hold her Plavix for 5 days prior to the procedure. Continue statin, beta-blocker, and ACE inhibitor therapy.

## 2023-01-28 ENCOUNTER — Encounter: Payer: Self-pay | Admitting: Gastroenterology

## 2023-01-29 ENCOUNTER — Encounter: Payer: Self-pay | Admitting: Gastroenterology

## 2023-01-29 ENCOUNTER — Ambulatory Visit
Admission: RE | Admit: 2023-01-29 | Discharge: 2023-01-29 | Disposition: A | Discharge status: Home / Self Care | Payer: 59 | Attending: Gastroenterology | Admitting: Gastroenterology

## 2023-01-29 ENCOUNTER — Ambulatory Visit: Payer: 59 | Admitting: General Practice

## 2023-01-29 ENCOUNTER — Encounter
Admission: RE | Disposition: A | Discharge status: Home / Self Care | Payer: Self-pay | Source: Home / Self Care | Attending: Gastroenterology

## 2023-01-29 DIAGNOSIS — G8929 Other chronic pain: Secondary | ICD-10-CM | POA: Diagnosis not present

## 2023-01-29 DIAGNOSIS — K644 Residual hemorrhoidal skin tags: Secondary | ICD-10-CM | POA: Insufficient documentation

## 2023-01-29 DIAGNOSIS — K2950 Unspecified chronic gastritis without bleeding: Secondary | ICD-10-CM | POA: Diagnosis not present

## 2023-01-29 DIAGNOSIS — R1013 Epigastric pain: Secondary | ICD-10-CM | POA: Diagnosis not present

## 2023-01-29 DIAGNOSIS — K219 Gastro-esophageal reflux disease without esophagitis: Secondary | ICD-10-CM

## 2023-01-29 DIAGNOSIS — Z1211 Encounter for screening for malignant neoplasm of colon: Secondary | ICD-10-CM

## 2023-01-29 HISTORY — PX: COLONOSCOPY WITH PROPOFOL: SHX5780

## 2023-01-29 HISTORY — PX: ESOPHAGOGASTRODUODENOSCOPY (EGD) WITH PROPOFOL: SHX5813

## 2023-01-29 HISTORY — DX: Encounter for screening for malignant neoplasm of colon: Z12.11

## 2023-01-29 SURGERY — COLONOSCOPY WITH PROPOFOL
Anesthesia: General

## 2023-01-29 MED ORDER — SODIUM CHLORIDE 0.9 % IV SOLN: INTRAVENOUS | Status: DC | PRN

## 2023-01-29 MED ORDER — GLYCOPYRROLATE 0.2 MG/ML IJ SOLN
INTRAMUSCULAR | Status: DC | PRN
  Administered 2023-01-29: .2 mg via INTRAVENOUS

## 2023-01-29 MED ORDER — LIDOCAINE HCL (CARDIAC) PF 100 MG/5ML IV SOSY
PREFILLED_SYRINGE | INTRAVENOUS | Status: DC | PRN
  Administered 2023-01-29: 100 mg via INTRAVENOUS

## 2023-01-29 MED ORDER — ONDANSETRON HCL 4 MG/2ML IJ SOLN
INTRAMUSCULAR | Status: DC | PRN
  Administered 2023-01-29: 4 mg via INTRAVENOUS

## 2023-01-29 MED ORDER — PROPOFOL 500 MG/50ML IV EMUL
INTRAVENOUS | Status: DC | PRN
  Administered 2023-01-29: 150 ug/kg/min via INTRAVENOUS
  Administered 2023-01-29 (×2): 50 mg via INTRAVENOUS

## 2023-01-29 MED ORDER — SODIUM CHLORIDE 0.9 % IV SOLN
INTRAVENOUS | Status: DC
  Administered 2023-01-29: 20 mL/h via INTRAVENOUS

## 2023-01-29 NOTE — Op Note (Signed)
[facility] Gastroenterology Patient Name: Susan Macias Procedure Date: 01/29/2023 10:07 AM MRN: [id number] Account #: [id number] Date of Birth: 1971/06/03 Admit Type: Outpatient Age: 52 Room: [facility] ENDO ROOM 4 Gender: Female Note Status: Finalized Instrument Name: Jasper Riling [id number] Procedure:             Colonoscopy Indications:           Screening for colorectal malignant neoplasm, This is                         the patient's first colonoscopy Providers:             Lin Landsman MD, MD Medicines:             General Anesthesia Complications:         No immediate complications. Estimated blood loss: None. Procedure:             Pre-Anesthesia Assessment:                        - Prior to the procedure, a History and Physical was                         performed, and patient medications and allergies were                         reviewed. The patient is competent. The risks and                         benefits of the procedure and the sedation options and                         risks were discussed with the patient. All questions                         were answered and informed consent was obtained.                         Patient identification and proposed procedure were                         verified by the physician, the nurse, the                         anesthesiologist, the anesthetist and the technician                         in the pre-procedure area in the procedure room in the                         endoscopy suite. Mental Status Examination: alert and                         oriented. Airway Examination: normal oropharyngeal                         airway and neck mobility. Respiratory Examination:                         clear to auscultation.  CV Examination: normal.                         Prophylactic Antibiotics: The patient does not require                         prophylactic antibiotics. Prior Anticoagulants: The                          patient has taken no anticoagulant or antiplatelet                         agents. ASA Grade Assessment: III - A patient with                         severe systemic disease. After reviewing the risks and                         benefits, the patient was deemed in satisfactory                         condition to undergo the procedure. The anesthesia                         plan was to use general anesthesia. Immediately prior                         to administration of medications, the patient was                         re-assessed for adequacy to receive sedatives. The                         heart rate, respiratory rate, oxygen saturations,                         blood pressure, adequacy of pulmonary ventilation, and                         response to care were monitored throughout the                         procedure. The physical status of the patient was                         re-assessed after the procedure.                        After obtaining informed consent, the colonoscope was                         passed under direct vision. Throughout the procedure,                         the patient's blood pressure, pulse, and oxygen                         saturations were monitored continuously. The  Colonoscope was introduced through the anus and                         advanced to the the cecum, identified by appendiceal                         orifice and ileocecal valve. The colonoscopy was                         performed with moderate difficulty due to the                         patient's discomfort during the procedure. Successful                         completion of the procedure was aided by Anesthesia                         staff assisting with sedation. The patient tolerated                         the procedure well. The quality of the bowel                         preparation was evaluated using the BBPS [facility] Bowel                          Preparation Scale) with scores of: Right Colon = 3,                         Transverse Colon = 3 and Left Colon = 3 (entire mucosa                         seen well with no residual staining, small fragments                         of stool or opaque liquid). The total BBPS score                         equals 9. The ileocecal valve, appendiceal orifice,                         and rectum were photographed. Findings:      The perianal and digital rectal examinations were normal. Pertinent       negatives include normal sphincter tone and no palpable rectal lesions.      Non-bleeding external hemorrhoids were found during retroflexion. The       hemorrhoids were medium-sized.      The exam was otherwise without abnormality. Impression:            - Non-bleeding external hemorrhoids.                        - The examination was otherwise normal.                        - No specimens collected. Recommendation:        - Discharge patient to home (  with escort).                        - Resume previous diet today.                        - Continue present medications.                        - Repeat colonoscopy in 10 years for screening                         purposes.                        - Resume Plavix (clopidogrel) today at prior dose.                         Refer to managing physician for further adjustment of                         therapy. Procedure Code(s):     --- Professional ---                        [id number], Colorectal cancer screening; colonoscopy on                         individual not meeting criteria for high risk Diagnosis Code(s):     --- Professional ---                        Z12.11, Encounter for screening for malignant neoplasm                         of colon                        K64.4, Residual hemorrhoidal skin tags CPT copyright 2022 American Medical Association. All rights reserved. The codes documented in this report are preliminary and upon  coder review may  be revised to meet current compliance requirements. Dr. Ulyess Mort Lin Landsman MD, MD 01/29/2023 10:39:59 AM This report has been signed electronically. Number of Addenda: 0 Note Initiated On: 01/29/2023 10:07 AM Scope Withdrawal Time: 0 hours 8 minutes 31 seconds  Total Procedure Duration: 0 hours 13 minutes 15 seconds  Estimated Blood Loss:  Estimated blood loss: none.      [facility]

## 2023-01-29 NOTE — Op Note (Signed)
[facility] Gastroenterology Patient Name: Susan Macias Procedure Date: 01/29/2023 10:08 AM MRN: [id number] Account #: [id number] Date of Birth: May 13, 1971 Admit Type: Outpatient Age: 52 Room: [facility] ENDO ROOM 4 Gender: Female Note Status: Finalized Instrument Name: Upper Endoscope [id number] Procedure:             Upper GI endoscopy Indications:           Epigastric abdominal pain, Follow-up of                         gastro-esophageal reflux disease Providers:             Lin Landsman MD, MD Medicines:             General Anesthesia Complications:         No immediate complications. Estimated blood loss: None. Procedure:             Pre-Anesthesia Assessment:                        - Prior to the procedure, a History and Physical was                         performed, and patient medications and allergies were                         reviewed. The patient is competent. The risks and                         benefits of the procedure and the sedation options and                         risks were discussed with the patient. All questions                         were answered and informed consent was obtained.                         Patient identification and proposed procedure were                         verified by the physician, the nurse, the                         anesthesiologist, the anesthetist and the technician                         in the pre-procedure area in the procedure room in the                         endoscopy suite. Mental Status Examination: alert and                         oriented. Airway Examination: normal oropharyngeal                         airway and neck mobility. Respiratory Examination:                         clear to auscultation.  CV Examination: normal.                         Prophylactic Antibiotics: The patient does not require                         prophylactic antibiotics. Prior Anticoagulants: The                          patient has taken Plavix (clopidogrel), last dose was                         5 days prior to procedure. ASA Grade Assessment: III -                         A patient with severe systemic disease. After                         reviewing the risks and benefits, the patient was                         deemed in satisfactory condition to undergo the                         procedure. The anesthesia plan was to use general                         anesthesia. Immediately prior to administration of                         medications, the patient was re-assessed for adequacy                         to receive sedatives. The heart rate, respiratory                         rate, oxygen saturations, blood pressure, adequacy of                         pulmonary ventilation, and response to care were                         monitored throughout the procedure. The physical                         status of the patient was re-assessed after the                         procedure.                        After obtaining informed consent, the endoscope was                         passed under direct vision. Throughout the procedure,                         the patient's blood pressure, pulse, and oxygen  saturations were monitored continuously. The Endoscope                         was introduced through the mouth, and advanced to the                         second part of duodenum. The upper GI endoscopy was                         accomplished without difficulty. The patient tolerated                         the procedure well. Findings:      The duodenal bulb and second portion of the duodenum were normal.      The entire examined stomach was normal. Biopsies were taken with a cold       forceps for histology.      The cardia and gastric fundus were normal on retroflexion.      The gastroesophageal junction and examined esophagus were normal.      Esophagogastric landmarks  were identified: the gastroesophageal junction       was found at 39 cm from the incisors. Impression:            - Normal duodenal bulb and second portion of the                         duodenum.                        - Normal stomach. Biopsied.                        - Normal gastroesophageal junction and esophagus.                        - Esophagogastric landmarks identified. Recommendation:        - Await pathology results.                        - Follow an antireflux regimen.                        - Continue present medications.                        - Proceed with colonoscopy as scheduled                        See colonoscopy report Procedure Code(s):     --- Professional ---                        [phone], Esophagogastroduodenoscopy, flexible,                         transoral; with biopsy, single or multiple Diagnosis Code(s):     --- Professional ---                        R10.13, Epigastric pain  K21.9, Gastro-esophageal reflux disease without                         esophagitis CPT copyright 2022 American Medical Association. All rights reserved. The codes documented in this report are preliminary and upon coder review may  be revised to meet current compliance requirements. Dr. Ulyess Mort Lin Landsman MD, MD 01/29/2023 10:19:52 AM This report has been signed electronically. Number of Addenda: 0 Note Initiated On: 01/29/2023 10:08 AM Estimated Blood Loss:  Estimated blood loss: none.      [facility]
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
## 2023-01-29 NOTE — Transfer of Care (Signed)
Immediate Anesthesia Transfer of Care Note  Patient: Susan Macias  Procedure(s) Performed: COLONOSCOPY WITH PROPOFOL ESOPHAGOGASTRODUODENOSCOPY (EGD) WITH PROPOFOL  Patient Location: PACU  Anesthesia Type:MAC  Level of Consciousness: awake  Airway & Oxygen Therapy: Patient Spontanous Breathing  Post-op Assessment: Report given to RN  Post vital signs: Reviewed  Last Vitals:  Vitals Value Taken Time  BP 113/71 01/29/23 1046  Temp 35.8 C 01/29/23 1046  Pulse 104 01/29/23 1049  Resp 17 01/29/23 1049  SpO2 96 % 01/29/23 1049  Vitals shown include unvalidated device data.  Last Pain:  Vitals:   01/29/23 1046  TempSrc: Tympanic  PainSc:          Complications: No notable events documented.

## 2023-01-29 NOTE — Anesthesia Postprocedure Evaluation (Signed)
Anesthesia Post Note  Patient: Susan Macias  Procedure(s) Performed: COLONOSCOPY WITH PROPOFOL ESOPHAGOGASTRODUODENOSCOPY (EGD) WITH PROPOFOL  Patient location during evaluation: Endoscopy Anesthesia Type: General Level of consciousness: awake and alert Pain management: pain level controlled Vital Signs Assessment: post-procedure vital signs reviewed and stable Respiratory status: spontaneous breathing, nonlabored ventilation, respiratory function stable and patient connected to nasal cannula oxygen Cardiovascular status: blood pressure returned to baseline and stable Postop Assessment: no apparent nausea or vomiting Anesthetic complications: no  No notable events documented.   Last Vitals:  Vitals:   01/29/23 1056 01/29/23 1106  BP: 109/84 119/86  Pulse:    Resp:    Temp:    SpO2:      Last Pain:  Vitals:   01/29/23 1106  TempSrc:   PainSc: 0-No pain                 Dimas Millin
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
## 2023-01-30 ENCOUNTER — Encounter: Payer: Self-pay | Admitting: Gastroenterology

## 2023-01-30 LAB — SURGICAL PATHOLOGY

## 2023-01-31 ENCOUNTER — Ambulatory Visit (INDEPENDENT_AMBULATORY_CARE_PROVIDER_SITE_OTHER): Payer: 59

## 2023-01-31 VITALS — Ht 69.0 in | Wt 242.0 lb

## 2023-01-31 DIAGNOSIS — Z0000 Encounter for general adult medical examination without abnormal findings: Secondary | ICD-10-CM | POA: Diagnosis not present

## 2023-01-31 DIAGNOSIS — Z1231 Encounter for screening mammogram for malignant neoplasm of breast: Secondary | ICD-10-CM
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
## 2023-01-31 NOTE — Patient Instructions (Signed)
[name] , Thank you for taking time to come for your Medicare Wellness Visit. I appreciate your ongoing commitment to your health goals. Please review the following plan we discussed and let me know if I can assist you in the future.   These are the goals we discussed:  Goals      DIET - EAT MORE FRUITS AND VEGETABLES     DIET - INCREASE WATER INTAKE     Recommend drinking at least 6-8 glasses of water a day.        This is a list of the screening recommended for you and due dates:  Health Maintenance  Topic Date Due   Zoster (Shingles) Vaccine (1 of 2) Never done   DTaP/Tdap/Td vaccine (2 - Td or Tdap) 12/02/2018   COVID-19 Vaccine (2 - Janssen risk series) 04/30/2020   Screening for Lung Cancer  Never done   Mammogram  12/22/2021   Flu Shot  07/02/2022   Medicare Annual Wellness Visit  01/31/2024   Colon Cancer Screening  01/29/2033   Hepatitis C Screening: USPSTF Recommendation to screen - Ages 18-79 yo.  Completed   HIV Screening  Completed   HPV Vaccine  Aged Out    Advanced directives: no  Conditions/risks identified: none  Next appointment: Follow up in one year for your annual wellness visit. 02/06/24 @ 11:30 am by phone  Preventive Care 40-64 Years, Female Preventive care refers to lifestyle choices and visits with your health care provider that can promote health and wellness. What does preventive care include? A yearly physical exam. This is also called an annual well check. Dental exams once or twice a year. Routine eye exams. Ask your health care provider how often you should have your eyes checked. Personal lifestyle choices, including: Daily care of your teeth and gums. Regular physical activity. Eating a healthy diet. Avoiding tobacco and drug use. Limiting alcohol use. Practicing safe sex. Taking low-dose aspirin daily starting at age 56. Taking vitamin and mineral supplements as recommended by your health care provider. What happens during an  annual well check? The services and screenings done by your health care provider during your annual well check will depend on your age, overall health, lifestyle risk factors, and family history of disease. Counseling  Your health care provider may ask you questions about your: Alcohol use. Tobacco use. Drug use. Emotional well-being. Home and relationship well-being. Sexual activity. Eating habits. Work and work Statistician. Method of birth control. Menstrual cycle. Pregnancy history. Screening  You may have the following tests or measurements: Height, weight, and BMI. Blood pressure. Lipid and cholesterol levels. These may be checked every 5 years, or more frequently if you are over 67 years old. Skin check. Lung cancer screening. You may have this screening every year starting at age 28 if you have a 30-pack-year history of smoking and currently smoke or have quit within the past 15 years. Fecal occult blood test (FOBT) of the stool. You may have this test every year starting at age 18. Flexible sigmoidoscopy or colonoscopy. You may have a sigmoidoscopy every 5 years or a colonoscopy every 10 years starting at age 50. Hepatitis C blood test. Hepatitis B blood test. Sexually transmitted disease (STD) testing. Diabetes screening. This is done by checking your blood sugar (glucose) after you have not eaten for a while (fasting). You may have this done every 1-3 years. Mammogram. This may be done every 1-2 years. Talk to your health care provider about when you  should start having regular mammograms. This may depend on whether you have a family history of breast cancer. BRCA-related cancer screening. This may be done if you have a family history of breast, ovarian, tubal, or peritoneal cancers. Pelvic exam and Pap test. This may be done every 3 years starting at age 32. Starting at age 23, this may be done every 5 years if you have a Pap test in combination with an HPV test. Bone  density scan. This is done to screen for osteoporosis. You may have this scan if you are at high risk for osteoporosis. Discuss your test results, treatment options, and if necessary, the need for more tests with your health care provider. Vaccines  Your health care provider may recommend certain vaccines, such as: Influenza vaccine. This is recommended every year. Tetanus, diphtheria, and acellular pertussis (Tdap, Td) vaccine. You may need a Td booster every 10 years. Zoster vaccine. You may need this after age 72. Pneumococcal 13-valent conjugate (PCV13) vaccine. You may need this if you have certain conditions and were not previously vaccinated. Pneumococcal polysaccharide (PPSV23) vaccine. You may need one or two doses if you smoke cigarettes or if you have certain conditions. Talk to your health care provider about which screenings and vaccines you need and how often you need them. This information is not intended to replace advice given to you by your health care provider. Make sure you discuss any questions you have with your health care provider. Document Released: 12/15/2015 Document Revised: 08/07/2016 Document Reviewed: 09/19/2015 Elsevier Interactive Patient Education  2017 [place] Prevention in the Home Falls can cause injuries. They can happen to people of all ages. There are many things you can do to make your home safe and to help prevent falls. What can I do on the outside of my home? Regularly fix the edges of walkways and driveways and fix any cracks. Remove anything that might make you trip as you walk through a door, such as a raised step or threshold. Trim any bushes or trees on the path to your home. Use bright outdoor lighting. Clear any walking paths of anything that might make someone trip, such as rocks or tools. Regularly check to see if handrails are loose or broken. Make sure that both sides of any steps have handrails. Any raised decks and  porches should have guardrails on the edges. Have any leaves, snow, or ice cleared regularly. Use sand or salt on walking paths during winter. Clean up any spills in your garage right away. This includes oil or grease spills. What can I do in the bathroom? Use night lights. Install grab bars by the toilet and in the tub and shower. Do not use towel bars as grab bars. Use non-skid mats or decals in the tub or shower. If you need to sit down in the shower, use a plastic, non-slip stool. Keep the floor dry. Clean up any water that spills on the floor as soon as it happens. Remove soap buildup in the tub or shower regularly. Attach bath mats securely with double-sided non-slip rug tape. Do not have throw rugs and other things on the floor that can make you trip. What can I do in the bedroom? Use night lights. Make sure that you have a light by your bed that is easy to reach. Do not use any sheets or blankets that are too big for your bed. They should not hang down onto the floor. Have a firm  chair that has side arms. You can use this for support while you get dressed. Do not have throw rugs and other things on the floor that can make you trip. What can I do in the kitchen? Clean up any spills right away. Avoid walking on wet floors. Keep items that you use a lot in easy-to-reach places. If you need to reach something above you, use a strong step stool that has a grab bar. Keep electrical cords out of the way. Do not use floor polish or wax that makes floors slippery. If you must use wax, use non-skid floor wax. Do not have throw rugs and other things on the floor that can make you trip. What can I do with my stairs? Do not leave any items on the stairs. Make sure that there are handrails on both sides of the stairs and use them. Fix handrails that are broken or loose. Make sure that handrails are as long as the stairways. Check any carpeting to make sure that it is firmly attached to the  stairs. Fix any carpet that is loose or worn. Avoid having throw rugs at the top or bottom of the stairs. If you do have throw rugs, attach them to the floor with carpet tape. Make sure that you have a light switch at the top of the stairs and the bottom of the stairs. If you do not have them, ask someone to add them for you. What else can I do to help prevent falls? Wear shoes that: Do not have high heels. Have rubber bottoms. Are comfortable and fit you well. Are closed at the toe. Do not wear sandals. If you use a stepladder: Make sure that it is fully opened. Do not climb a closed stepladder. Make sure that both sides of the stepladder are locked into place. Ask someone to hold it for you, if possible. Clearly mark and make sure that you can see: Any grab bars or handrails. First and last steps. Where the edge of each step is. Use tools that help you move around (mobility aids) if they are needed. These include: Canes. Walkers. Scooters. Crutches. Turn on the lights when you go into a dark area. Replace any light bulbs as soon as they burn out. Set up your furniture so you have a clear path. Avoid moving your furniture around. If any of your floors are uneven, fix them. If there are any pets around you, be aware of where they are. Review your medicines with your doctor. Some medicines can make you feel dizzy. This can increase your chance of falling. Ask your doctor what other things that you can do to help prevent falls. This information is not intended to replace advice given to you by your health care provider. Make sure you discuss any questions you have with your health care provider. Document Released: 09/14/2009 Document Revised: 04/25/2016 Document Reviewed: 12/23/2014 Elsevier Interactive Patient Education  2017 [employer].

## 2023-02-10 ENCOUNTER — Other Ambulatory Visit: Payer: Self-pay | Admitting: Family Medicine

## 2023-02-10 DIAGNOSIS — I10 Essential (primary) hypertension: Secondary | ICD-10-CM

## 2023-02-11 NOTE — Telephone Encounter (Signed)
Requested medication (s) are due for refill today - yes  Requested medication (s) are on the active medication list -yes  Future visit scheduled -no  Last refill: 01/14/23 #30  Notes to clinic: Patient has had courtesy fill- does have NP appointment scheduled at another office- can this Rx be extended until appointment?  Requested Prescriptions  Pending Prescriptions Disp Refills   lisinopril (ZESTRIL) 5 MG tablet [Pharmacy Med Name: LISINOPRIL 5 MG TABLET] 90 tablet 1    Sig: TAKE 1 TABLET (5 MG TOTAL) BY MOUTH DAILY.     Cardiovascular:  ACE Inhibitors Failed - 02/10/2023  9:31 AM      Failed - Cr in normal range and within 180 days    Creatinine  Date Value Ref Range Status  02/17/2014 0.78 0.60 - 1.30 mg/dL Final   Creatinine, Ser  Date Value Ref Range Status  05/31/2022 1.00 0.44 - 1.00 mg/dL Final         Failed - K in normal range and within 180 days    Potassium  Date Value Ref Range Status  05/18/2021 4.3 3.5 - 5.2 mmol/L Final  02/17/2014 3.8 3.5 - 5.1 mmol/L Final         Passed - Patient is not pregnant      Passed - Last BP in normal range    BP Readings from Last 1 Encounters:  01/29/23 119/86         Passed - Valid encounter within last 6 months    Recent Outpatient Visits           7 months ago Adjustment disorder with mixed anxiety and depressed mood   [facility] Olin Hauser, DO   10 months ago Abdominal bloating   [place], [place], DO   1 year ago [facility] Olin Hauser, DO   1 year ago Upper respiratory tract infection, unspecified type   [facility] [place], [place] P, DO   2 years ago Acute cystitis with hematuria   [facility] [place], [place] P, DO                 Requested Prescriptions  Pending Prescriptions Disp Refills   lisinopril  (ZESTRIL) 5 MG tablet [Pharmacy Med Name: LISINOPRIL 5 MG TABLET] 90 tablet 1    Sig: TAKE 1 TABLET (5 MG TOTAL) BY MOUTH DAILY.     Cardiovascular:  ACE Inhibitors Failed - 02/10/2023  9:31 AM      Failed - Cr in normal range and within 180 days    Creatinine  Date Value Ref Range Status  02/17/2014 0.78 0.60 - 1.30 mg/dL Final   Creatinine, Ser  Date Value Ref Range Status  05/31/2022 1.00 0.44 - 1.00 mg/dL Final         Failed - K in normal range and within 180 days    Potassium  Date Value Ref Range Status  05/18/2021 4.3 3.5 - 5.2 mmol/L Final  02/17/2014 3.8 3.5 - 5.1 mmol/L Final         Passed - Patient is not pregnant      Passed - Last BP in normal range    BP Readings from Last 1 Encounters:  01/29/23 119/86         Passed - Valid encounter within last 6 months    Recent Outpatient Visits  7 months ago Adjustment disorder with mixed anxiety and depressed mood   [place], DO   10 months ago Abdominal bloating   [place], DO   1 year ago [place], DO   1 year ago Upper respiratory tract infection, unspecified type   [place], [place], DO   2 years ago Acute cystitis with hematuria   [place], [place], DO

## 2023-02-25 ENCOUNTER — Other Ambulatory Visit: Payer: Self-pay | Admitting: Family Medicine

## 2023-02-25 DIAGNOSIS — K5909 Other constipation: Secondary | ICD-10-CM

## 2023-02-26 NOTE — Telephone Encounter (Signed)
Requested Prescriptions  Pending Prescriptions Disp Refills   LINZESS 145 MCG CAPS capsule [Pharmacy Med Name: LINZESS 145 MCG CAPSULE] 90 capsule 0    Sig: TAKE 1 CAPSULE BY MOUTH EVERY Holiday     Gastroenterology: Irritable Bowel Syndrome Passed - 02/25/2023  1:19 PM      Passed - Valid encounter within last 12 months    Recent Outpatient Visits           7 months ago Adjustment disorder with mixed anxiety and depressed mood   [place], DO   10 months ago Abdominal bloating   [place], DO   1 year ago [place], DO   1 year ago Upper respiratory tract infection, unspecified type   [place], [place], DO   2 years ago Acute cystitis with hematuria   [place], DO       Future Appointments             In 1 week [place], Devonne Doughty, [facility], [facility]

## 2023-03-05 ENCOUNTER — Ambulatory Visit (INDEPENDENT_AMBULATORY_CARE_PROVIDER_SITE_OTHER): Payer: 59 | Admitting: Family Medicine

## 2023-03-05 ENCOUNTER — Encounter: Payer: Self-pay | Admitting: Family Medicine

## 2023-03-05 ENCOUNTER — Other Ambulatory Visit: Payer: Self-pay | Admitting: Family Medicine

## 2023-03-05 VITALS — BP 122/86 | HR 66 | Temp 96.4°F | Wt 246.0 lb

## 2023-03-05 DIAGNOSIS — I10 Essential (primary) hypertension: Secondary | ICD-10-CM

## 2023-03-05 DIAGNOSIS — M4726 Other spondylosis with radiculopathy, lumbar region: Secondary | ICD-10-CM

## 2023-03-05 DIAGNOSIS — E782 Mixed hyperlipidemia: Secondary | ICD-10-CM

## 2023-03-05 DIAGNOSIS — G894 Chronic pain syndrome: Secondary | ICD-10-CM

## 2023-03-05 DIAGNOSIS — M47812 Spondylosis without myelopathy or radiculopathy, cervical region: Secondary | ICD-10-CM

## 2023-03-05 DIAGNOSIS — I2510 Atherosclerotic heart disease of native coronary artery without angina pectoris: Secondary | ICD-10-CM

## 2023-03-05 DIAGNOSIS — R7309 Other abnormal glucose: Secondary | ICD-10-CM

## 2023-03-05 DIAGNOSIS — M47816 Spondylosis without myelopathy or radiculopathy, lumbar region: Secondary | ICD-10-CM

## 2023-03-05 DIAGNOSIS — F411 Generalized anxiety disorder: Secondary | ICD-10-CM

## 2023-03-05 DIAGNOSIS — Z0000 Encounter for general adult medical examination without abnormal findings: Secondary | ICD-10-CM

## 2023-03-05 MED ORDER — NITROGLYCERIN 0.4 MG SL SUBL: SUBLINGUAL_TABLET | SUBLINGUAL | 0 refills | Status: DC

## 2023-03-05 MED ORDER — ESCITALOPRAM OXALATE 10 MG PO TABS: 10.0000 mg | ORAL_TABLET | Freq: Every day | ORAL | 1 refills | Status: AC
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
## 2023-03-05 NOTE — Patient Instructions (Addendum)
Thank you for coming to the office today.  Restart Escitalopram lexapro for anxiety daily. New order in  Refilled the [facility]  Referral today sent. Stay tuned for updates, you can check with [facility] in 2 weeks if not heard back.  Sometimes, it may require a [facility] doctor to order this referral, in that case we can let you know, I would suggest asking the Spine Specialist if they can do the referral.  [facility] [address] [place], [place] [address]   Appointments [phone] Office [phone]  DUE for [place] (no food or drink after midnight before the lab appointment, only water or coffee without cream/sugar on the morning of)  SCHEDULE "Lab Only" visit in the morning at the clinic for lab draw in 3  MONTHS   - Make sure Lab Only appointment is at about 1 week before your next appointment, so that results will be available  For Lab Results, once available within 2-3 days of blood draw, you can can log in to MyChart online to view your results and a brief explanation. Also, we can discuss results at next follow-up visit.      Please schedule a Follow-up Appointment to: Return in about 3 months (around 06/04/2023) for 3 month fasting lab only then 1 week later Annual Physical.  If you have any other questions or concerns, please feel free to call the office or send a message through [place]. You may also schedule an earlier appointment if necessary.  Additionally, you may be receiving a survey about your experience at our office within a few days to 1 week by e-mail or mail. We value your feedback.  Nobie Putnam, DO [place]

## 2023-03-18 ENCOUNTER — Other Ambulatory Visit: Payer: Self-pay | Admitting: Family Medicine

## 2023-03-18 DIAGNOSIS — G894 Chronic pain syndrome: Secondary | ICD-10-CM

## 2023-03-19 NOTE — Telephone Encounter (Signed)
Requested medication (s) are due for refill today: yes  Requested medication (s) are on the active medication list: yes    Last refill: 10/07/22  #90  2 refills  Future visit scheduled yes  06/10/23  Notes to clinic:Not delegated, please review. Thank you.  Requested Prescriptions  Pending Prescriptions Disp Refills   pregabalin (LYRICA) 150 MG capsule [Pharmacy Med Name: PREGABALIN 150 MG CAPSULE] 90 capsule 2    Sig: TAKE 1 CAPSULE BY MOUTH THREE TIMES A DAY     Not Delegated - Neurology:  Anticonvulsants - Controlled - pregabalin Failed - 03/18/2023  4:14 PM      Failed - This refill cannot be delegated      Passed - Cr in normal range and within 360 days    Creatinine  Date Value Ref Range Status  02/17/2014 0.78 0.60 - 1.30 mg/dL Final   Creatinine, Ser  Date Value Ref Range Status  05/31/2022 1.00 0.44 - 1.00 mg/dL Final         Passed - Completed PHQ-2 or PHQ-9 in the last 360 days      Passed - Valid encounter within last 12 months    Recent Outpatient Visits           2 weeks ago Essential hypertension   [place] [facility] Smitty Cords, DO   8 months ago Adjustment disorder with mixed anxiety and depressed mood   [place] [facility] Smitty Cords, DO   11 months ago Abdominal bloating   [place] [facility] Smitty Cords, DO   1 year ago Fibromyalgia   [place] [facility] Smitty Cords, DO   1 year ago Upper respiratory tract infection, unspecified type   [place] [facility] [name], DO       Future Appointments             In 2 months Althea Charon, [name], DO [place] [facility] [place], [facility]

## 2023-04-07 ENCOUNTER — Other Ambulatory Visit: Payer: Self-pay | Admitting: Family Medicine

## 2023-04-07 DIAGNOSIS — K5909 Other constipation: Secondary | ICD-10-CM

## 2023-04-07 DIAGNOSIS — I2510 Atherosclerotic heart disease of native coronary artery without angina pectoris: Secondary | ICD-10-CM

## 2023-04-07 MED ORDER — NITROGLYCERIN 0.4 MG SL SUBL: SUBLINGUAL_TABLET | SUBLINGUAL | 2 refills | Status: DC

## 2023-04-07 MED ORDER — LINZESS 145 MCG PO CAPS: 145.0000 ug | ORAL_CAPSULE | Freq: Every day | ORAL | 1 refills | Status: AC

## 2023-04-20 ENCOUNTER — Other Ambulatory Visit: Payer: Self-pay | Admitting: Family Medicine

## 2023-04-20 DIAGNOSIS — I2510 Atherosclerotic heart disease of native coronary artery without angina pectoris: Secondary | ICD-10-CM

## 2023-04-21 ENCOUNTER — Other Ambulatory Visit: Payer: Self-pay | Admitting: Family Medicine

## 2023-04-21 DIAGNOSIS — G894 Chronic pain syndrome: Secondary | ICD-10-CM

## 2023-04-21 DIAGNOSIS — M797 Fibromyalgia: Secondary | ICD-10-CM

## 2023-04-22 NOTE — Telephone Encounter (Signed)
Requested medication (s) are due for refill today - no  Requested medication (s) are on the active medication list -no  Future visit scheduled -yes  Last refill: 10/18/22  Notes to clinic: non delegated Rx, medication no longer listed on current medication list  Requested Prescriptions  Pending Prescriptions Disp Refills   cyclobenzaprine (FLEXERIL) 10 MG tablet [Pharmacy Med Name: CYCLOBENZAPRINE 10 MG TABLET] 180 tablet 1    Sig: TAKE 1 TABLET BY MOUTH TWICE A DAY AS NEEDED FOR MUSCLE SPASMS     Not Delegated - Analgesics:  Muscle Relaxants Failed - 04/21/2023  3:55 PM      Failed - This refill cannot be delegated      Passed - Valid encounter within last 6 months    Recent Outpatient Visits           1 month ago Essential hypertension   [place] [facility] [place], [name], DO   9 months ago Adjustment disorder with mixed anxiety and depressed mood   [place] [facility] [place], [name], DO   1 year ago Abdominal bloating   [place] [facility] Smitty Cords, DO   1 year ago Fibromyalgia   [place] [facility] Smitty Cords, DO   1 year ago Upper respiratory tract infection, unspecified type   [place] [facility] [name], DO       Future Appointments             In 1 month Althea Charon, [name], DO [place] [facility], [facility]               Requested Prescriptions  Pending Prescriptions Disp Refills   cyclobenzaprine (FLEXERIL) 10 MG tablet [Pharmacy Med Name: CYCLOBENZAPRINE 10 MG TABLET] 180 tablet 1    Sig: TAKE 1 TABLET BY MOUTH TWICE A DAY AS NEEDED FOR MUSCLE SPASMS     Not Delegated - Analgesics:  Muscle Relaxants Failed - 04/21/2023  3:55 PM      Failed - This refill cannot be delegated      Passed - Valid encounter within last 6 months    Recent Outpatient Visits           1 month ago  Essential hypertension   [place] [facility] [place], [name], DO   9 months ago Adjustment disorder with mixed anxiety and depressed mood   [place] [facility] [place], [name], DO   1 year ago Abdominal bloating   [place] [facility] Smitty Cords, DO   1 year ago Fibromyalgia   [place] [facility] Smitty Cords, DO   1 year ago Upper respiratory tract infection, unspecified type   [place] [facility] [name], DO       Future Appointments             In 1 month Althea Charon, [name], DO  [facility], [facility]

## 2023-04-22 NOTE — Telephone Encounter (Signed)
Requested Prescriptions  Pending Prescriptions Disp Refills   nitroGLYCERIN (NITROSTAT) 0.4 MG SL tablet [Pharmacy Med Name: NITROGLYCERIN 0.4 MG TABLET SL] 75 tablet 1    Sig: TAKE 1 TABLET BY MOUTH UNDER TONGUE EVERY 5 MIN AS NEEDED FOR CHEST PAIN     Cardiovascular:  Nitrates Passed - 04/20/2023  4:50 PM      Passed - Last BP in normal range    BP Readings from Last 1 Encounters:  03/05/23 122/86         Passed - Last Heart Rate in normal range    Pulse Readings from Last 1 Encounters:  03/05/23 66         Passed - Valid encounter within last 12 months    Recent Outpatient Visits           1 month ago Essential hypertension   [place] [facility] [place], [name], DO   9 months ago Adjustment disorder with mixed anxiety and depressed mood   [place] [facility] Smitty Cords, DO   1 year ago Abdominal bloating   [place] [facility] Smitty Cords, DO   1 year ago Fibromyalgia   [place] [facility] Smitty Cords, DO   1 year ago Upper respiratory tract infection, unspecified type   [place] [facility] [name], DO       Future Appointments             In 1 month Althea Charon, [name], DO [place] [name], [facility]

## 2023-05-12 ENCOUNTER — Other Ambulatory Visit: Payer: Self-pay | Admitting: Family Medicine

## 2023-05-12 DIAGNOSIS — A6000 Herpesviral infection of urogenital system, unspecified: Secondary | ICD-10-CM

## 2023-05-13 NOTE — Telephone Encounter (Signed)
Requested Prescriptions  Pending Prescriptions Disp Refills   valACYclovir (VALTREX) 1000 MG tablet [Pharmacy Med Name: VALACYCLOVIR HCL 1 GRAM TABLET] 20 tablet 3    Sig: TAKE 1 TABLET BY MOUTH 2 TIMES DAILY. 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     Antimicrobials:  Antiviral Agents - Anti-Herpetic Passed - 05/12/2023  4:27 AM      Passed - Valid encounter within last 12 months    Recent Outpatient Visits           2 months ago Essential hypertension   [place] [facility] [place], [name], DO   10 months ago Adjustment disorder with mixed anxiety and depressed mood   [place] [facility] Smitty Cords, DO   1 year ago Abdominal bloating   [place] [facility] Smitty Cords, DO   1 year ago Fibromyalgia   [place] [facility] Smitty Cords, DO   1 year ago Upper respiratory tract infection, unspecified type   [place] [facility], [name], DO

## 2023-05-21 ENCOUNTER — Other Ambulatory Visit: Payer: Self-pay | Admitting: Family Medicine

## 2023-05-21 DIAGNOSIS — I10 Essential (primary) hypertension: Secondary | ICD-10-CM

## 2023-05-21 NOTE — Telephone Encounter (Signed)
Requested Prescriptions  Pending Prescriptions Disp Refills   lisinopril (ZESTRIL) 5 MG tablet [Pharmacy Med Name: LISINOPRIL 5 MG TABLET] 90 tablet 0    Sig: TAKE 1 TABLET (5 MG TOTAL) BY MOUTH DAILY.     Cardiovascular:  ACE Inhibitors Failed - 05/21/2023  2:12 AM      Failed - Cr in normal range and within 180 days    Creatinine  Date Value Ref Range Status  02/17/2014 0.78 0.60 - 1.30 mg/dL Final   Creatinine, Ser  Date Value Ref Range Status  05/31/2022 1.00 0.44 - 1.00 mg/dL Final         Failed - K in normal range and within 180 days    Potassium  Date Value Ref Range Status  05/18/2021 4.3 3.5 - 5.2 mmol/L Final  02/17/2014 3.8 3.5 - 5.1 mmol/L Final         Passed - Patient is not pregnant      Passed - Last BP in normal range    BP Readings from Last 1 Encounters:  03/05/23 122/86         Passed - Valid encounter within last 6 months    Recent Outpatient Visits           2 months ago Essential hypertension   [place] [facility] Smitty Cords, DO   10 months ago Adjustment disorder with mixed anxiety and depressed mood   [place] [facility] Smitty Cords, DO   1 year ago Abdominal bloating   [place] [facility] Smitty Cords, DO   1 year ago Fibromyalgia   [place] [facility] Smitty Cords, DO   1 year ago Upper respiratory tract infection, unspecified type   [place] [facility] [place], [name], DO

## 2023-05-28 ENCOUNTER — Other Ambulatory Visit: Payer: Self-pay | Admitting: Family Medicine

## 2023-05-28 DIAGNOSIS — G894 Chronic pain syndrome: Secondary | ICD-10-CM

## 2023-05-28 DIAGNOSIS — M797 Fibromyalgia: Secondary | ICD-10-CM

## 2023-05-28 NOTE — Telephone Encounter (Signed)
Unable to refill per protocol, Rx expired. Discontinued 01/08/23, course completed.  Requested Prescriptions  Pending Prescriptions Disp Refills   cyclobenzaprine (FLEXERIL) 10 MG tablet [Pharmacy Med Name: CYCLOBENZAPRINE 10 MG TABLET] 180 tablet 1    Sig: TAKE 1 TABLET BY MOUTH TWICE A DAY AS NEEDED FOR MUSCLE SPASMS     Not Delegated - Analgesics:  Muscle Relaxants Failed - 05/28/2023  2:07 AM      Failed - This refill cannot be delegated      Passed - Valid encounter within last 6 months    Recent Outpatient Visits           2 months ago Essential hypertension   [place] [facility] [place], [name], DO   10 months ago Adjustment disorder with mixed anxiety and depressed mood   [place] [facility] Smitty Cords, DO   1 year ago Abdominal bloating   [place] [facility] Smitty Cords, DO   1 year ago Fibromyalgia   [place] [facility] Smitty Cords, DO   1 year ago Upper respiratory tract infection, unspecified type   [place] [facility] [place], [name], DO

## 2023-05-30 ENCOUNTER — Other Ambulatory Visit: Payer: Self-pay | Admitting: Nurse Practitioner

## 2023-05-30 DIAGNOSIS — E782 Mixed hyperlipidemia: Secondary | ICD-10-CM

## 2023-05-31 ENCOUNTER — Other Ambulatory Visit: Payer: Self-pay | Admitting: Family Medicine

## 2023-05-31 DIAGNOSIS — K219 Gastro-esophageal reflux disease without esophagitis: Secondary | ICD-10-CM

## 2023-05-31 DIAGNOSIS — I10 Essential (primary) hypertension: Secondary | ICD-10-CM

## 2023-06-02 NOTE — Telephone Encounter (Signed)
Requested Prescriptions  Pending Prescriptions Disp Refills   metoprolol tartrate (LOPRESSOR) 50 MG tablet [Pharmacy Med Name: METOPROLOL TARTRATE 50 MG TAB] 180 tablet 1    Sig: TAKE 1 TABLET BY MOUTH TWICE A DAY     Cardiovascular:  Beta Blockers Passed - 05/31/2023  1:24 AM      Passed - Last BP in normal range    BP Readings from Last 1 Encounters:  03/05/23 122/86         Passed - Last Heart Rate in normal range    Pulse Readings from Last 1 Encounters:  03/05/23 66         Passed - Valid encounter within last 6 months    Recent Outpatient Visits           2 months ago Essential hypertension   [place] [facility] [place], [name], DO   10 months ago Adjustment disorder with mixed anxiety and depressed mood   [place] [facility] Smitty Cords, DO   1 year ago Abdominal bloating   [place] [facility] Smitty Cords, DO   1 year ago Fibromyalgia   [place] [facility], [name], DO   1 year ago Upper respiratory tract infection, unspecified type   [place] [facility], [name], DO               pantoprazole (PROTONIX) 40 MG tablet [Pharmacy Med Name: PANTOPRAZOLE SOD DR 40 MG TAB] 90 tablet 1    Sig: TAKE 1 TABLET BY MOUTH EVERY DAY BEFORE BREAKFAST     Gastroenterology: Proton Pump Inhibitors Passed - 05/31/2023  1:24 AM      Passed - Valid encounter within last 12 months    Recent Outpatient Visits           2 months ago Essential hypertension   [place] [facility] [place], [name], DO   10 months ago Adjustment disorder with mixed anxiety and depressed mood   [place] [facility] [place], [name], DO   1 year ago Abdominal bloating   [place] [facility] Smitty Cords, DO   1 year ago Fibromyalgia   [place] [facility] Smitty Cords, DO   1 year ago Upper respiratory tract infection, unspecified type   [place] [facility] [place], [name], DO

## 2023-06-03 ENCOUNTER — Other Ambulatory Visit: Payer: 59

## 2023-06-10 ENCOUNTER — Encounter: Payer: 59 | Admitting: Family Medicine

## 2023-07-01 ENCOUNTER — Other Ambulatory Visit: Payer: Self-pay | Admitting: Family Medicine

## 2023-07-01 DIAGNOSIS — G894 Chronic pain syndrome: Secondary | ICD-10-CM

## 2023-07-02 NOTE — Telephone Encounter (Signed)
Requested medication (s) are due for refill today: No  Requested medication (s) are on the active medication list: Yes  Last refill:  03/19/23 #90, 2RF  Future visit scheduled: No  Notes to clinic:  Unable to refill per protocol, cannot delegate. Request too soon      Requested Prescriptions  Pending Prescriptions Disp Refills   pregabalin (LYRICA) 150 MG capsule [Pharmacy Med Name: PREGABALIN 150 MG CAPSULE] 90 capsule 2    Sig: TAKE 1 CAPSULE BY MOUTH THREE TIMES A DAY     Not Delegated - Neurology:  Anticonvulsants - Controlled - pregabalin Failed - 07/01/2023 11:35 PM      Failed - This refill cannot be delegated      Failed - Cr in normal range and within 360 days    Creatinine  Date Value Ref Range Status  02/17/2014 0.78 0.60 - 1.30 mg/dL Final   Creatinine, Ser  Date Value Ref Range Status  05/31/2022 1.00 0.44 - 1.00 mg/dL Final         Passed - Completed PHQ-2 or PHQ-9 in the last 360 days      Passed - Valid encounter within last 12 months    Recent Outpatient Visits           3 months ago Essential hypertension   [place] [facility] Smitty Cords, DO   11 months ago Adjustment disorder with mixed anxiety and depressed mood   [place] [facility] Smitty Cords, DO   1 year ago Abdominal bloating   [place] [facility] Smitty Cords, DO   1 year ago Fibromyalgia   [place] [facility] Smitty Cords, DO   1 year ago Upper respiratory tract infection, unspecified type   [place] [facility] [name], DO       Future Appointments             In 1 month Dunn, Raymon Mutton, PA-C [facility] [place]
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
## 2023-08-06 ENCOUNTER — Ambulatory Visit: Payer: 59 | Attending: Physician Assistant | Admitting: Physician Assistant

## 2023-08-06 ENCOUNTER — Encounter: Payer: Self-pay | Admitting: Physician Assistant

## 2023-08-06 VITALS — BP 142/98 | HR 66 | Ht 69.0 in | Wt 233.0 lb

## 2023-08-06 DIAGNOSIS — E785 Hyperlipidemia, unspecified: Secondary | ICD-10-CM

## 2023-08-06 DIAGNOSIS — I10 Essential (primary) hypertension: Secondary | ICD-10-CM | POA: Diagnosis not present

## 2023-08-06 DIAGNOSIS — I2510 Atherosclerotic heart disease of native coronary artery without angina pectoris: Secondary | ICD-10-CM

## 2023-08-06 DIAGNOSIS — M25473 Effusion, unspecified ankle: Secondary | ICD-10-CM | POA: Diagnosis not present

## 2023-08-06 NOTE — Patient Instructions (Signed)
Medication Instructions:  Your Physician recommend you continue on your current medication as directed.    *If you need a refill on your cardiac medications before your next appointment, please call your pharmacy*  Lab Work: Your provider would like for you to have following labs drawn today CMET, TSH, Lipid Panel, and a Direct LDL.   If you have labs (blood work) drawn today and your tests are completely normal, you will receive your results only by: MyChart Message (if you have MyChart) OR A paper copy in the mail If you have any lab test that is abnormal or we need to change your treatment, we will call you to review the results.  Follow-Up: At [facility], you and your health needs are our priority.  As part of our continuing mission to provide you with exceptional heart care, we have created designated Provider Care Teams.  These Care Teams include your primary Cardiologist (physician) and Advanced Practice Providers (APPs -  Physician Assistants and Nurse Practitioners) who all work together to provide you with the care you need, when you need it.  We recommend signing up for the patient portal called "MyChart".  Sign up information is provided on this After Visit Summary.  MyChart is used to connect with patients for Virtual Visits (Telemedicine).  Patients are able to view lab/test results, encounter notes, upcoming appointments, etc.  Non-urgent messages can be sent to your provider as well.   To learn more about what you can do with MyChart, go to [email or web address].    Your next appointment:   6 month(s)  Provider:   You may see Julien Nordmann, MD or one of the following Advanced Practice Providers on your designated Care Team:   Eula Listen, [name]

## 2023-08-07 ENCOUNTER — Other Ambulatory Visit: Payer: Self-pay

## 2023-08-07 LAB — COMPREHENSIVE METABOLIC PANEL
ALT: 18 IU/L (ref 0–32)
AST: 14 IU/L (ref 0–40)
Albumin: 4.2 g/dL (ref 3.8–4.9)
Alkaline Phosphatase: 116 IU/L (ref 44–121)
BUN/Creatinine Ratio: 14 (ref 9–23)
BUN: 12 mg/dL (ref 6–24)
Bilirubin Total: 0.5 mg/dL (ref 0.0–1.2)
CO2: 24 mmol/L (ref 20–29)
Calcium: 9.1 mg/dL (ref 8.7–10.2)
Chloride: 105 mmol/L (ref 96–106)
Creatinine, Ser: 0.83 mg/dL (ref 0.57–1.00)
Globulin, Total: 2.8 g/dL (ref 1.5–4.5)
Glucose: 100 mg/dL — ABNORMAL HIGH (ref 70–99)
Potassium: 3.6 mmol/L (ref 3.5–5.2)
Sodium: 143 mmol/L (ref 134–144)
Total Protein: 7 g/dL (ref 6.0–8.5)
eGFR: 85 mL/min/{1.73_m2} (ref >59)

## 2023-08-07 LAB — TSH: TSH: 1.3 u[IU]/mL (ref 0.450–4.500)

## 2023-08-07 LAB — LDL CHOLESTEROL, DIRECT: LDL Direct: 44 mg/dL (ref 0–99)

## 2023-08-07 LAB — LIPID PANEL
Chol/HDL Ratio: 2.5 ratio (ref 0.0–4.4)
Cholesterol, Total: 103 mg/dL (ref 100–199)
HDL: 42 mg/dL (ref >39)
LDL Chol Calc (NIH): 42 mg/dL (ref 0–99)
Triglycerides: 104 mg/dL (ref 0–149)
VLDL Cholesterol Cal: 19 mg/dL (ref 5–40)

## 2023-08-07 MED ORDER — POTASSIUM CHLORIDE ER 10 MEQ PO TBCR: 10.0000 meq | EXTENDED_RELEASE_TABLET | Freq: Every day | ORAL | 3 refills | Status: DC | PRN

## 2023-08-07 MED ORDER — HYDROCHLOROTHIAZIDE 12.5 MG PO CAPS: 12.5000 mg | ORAL_CAPSULE | Freq: Every day | ORAL | 3 refills | Status: DC | PRN

## 2023-08-16 ENCOUNTER — Other Ambulatory Visit: Payer: Self-pay | Admitting: Family Medicine

## 2023-08-16 DIAGNOSIS — I10 Essential (primary) hypertension: Secondary | ICD-10-CM

## 2023-08-18 NOTE — Telephone Encounter (Signed)
Requested medications are due for refill today.  yes  Requested medications are on the active medications list.  yes  Last refill. 05/21/2023 #90 0 rf  Future visit scheduled.   no  Notes to clinic.  Susan Macias listed as PCP.    Requested Prescriptions  Pending Prescriptions Disp Refills   lisinopril (ZESTRIL) 5 MG tablet [Pharmacy Med Name: LISINOPRIL 5 MG TABLET] 90 tablet 0    Sig: TAKE 1 TABLET (5 MG TOTAL) BY MOUTH DAILY.     Cardiovascular:  ACE Inhibitors Failed - 08/16/2023  1:07 AM      Failed - Last BP in normal range    BP Readings from Last 1 Encounters:  08/06/23 (!) 142/98         Passed - Cr in normal range and within 180 days    Creatinine  Date Value Ref Range Status  02/17/2014 0.78 0.60 - 1.30 mg/dL Final   Creatinine, Ser  Date Value Ref Range Status  08/06/2023 0.83 0.57 - 1.00 mg/dL Final         Passed - K in normal range and within 180 days    Potassium  Date Value Ref Range Status  08/06/2023 3.6 3.5 - 5.2 mmol/L Final  02/17/2014 3.8 3.5 - 5.1 mmol/L Final         Passed - Patient is not pregnant      Passed - Valid encounter within last 6 months    Recent Outpatient Visits           5 months ago Essential hypertension   [place] [facility] Smitty Cords, DO   1 year ago Adjustment disorder with mixed anxiety and depressed mood   [place] [facility] Smitty Cords, DO   1 year ago Abdominal bloating   [place] [facility] Smitty Cords, DO   1 year ago Fibromyalgia   [place] [facility] Smitty Cords, DO   1 year ago Upper respiratory tract infection, unspecified type   [place] [facility], [name], DO

## 2023-08-30 ENCOUNTER — Other Ambulatory Visit: Payer: Self-pay | Admitting: Family Medicine

## 2023-08-30 DIAGNOSIS — I2510 Atherosclerotic heart disease of native coronary artery without angina pectoris: Secondary | ICD-10-CM

## 2023-09-01 NOTE — Telephone Encounter (Signed)
Requested Prescriptions  Pending Prescriptions Disp Refills   nitroGLYCERIN (NITROSTAT) 0.4 MG SL tablet [Pharmacy Med Name: NITROGLYCERIN 0.4 MG TABLET SL] 75 tablet 1    Sig: TAKE 1 TABLET BY MOUTH UNDER TONGUE EVERY 5 MIN AS NEEDED FOR CHEST PAIN     Cardiovascular:  Nitrates Failed - 08/30/2023  2:11 PM      Failed - Last BP in normal range    BP Readings from Last 1 Encounters:  08/06/23 (!) 142/98         Passed - Last Heart Rate in normal range    Pulse Readings from Last 1 Encounters:  08/06/23 66         Passed - Valid encounter within last 12 months    Recent Outpatient Visits           6 months ago Essential hypertension   [place] [facility] [place], [name], DO   1 year ago Adjustment disorder with mixed anxiety and depressed mood   [facility] Smitty Cords, DO   1 year ago Abdominal bloating   [place] [facility] Smitty Cords, DO   1 year ago Fibromyalgia   [place] [facility] Smitty Cords, DO   2 years ago Upper respiratory tract infection, unspecified type    [facility] [place], [name], DO

## 2023-09-01 NOTE — Addendum Note (Signed)
Addended by: Ross Ludwig on: 09/01/2023 12:20 PM   Modules accepted: Orders

## 2023-09-04 ENCOUNTER — Other Ambulatory Visit: Payer: Self-pay | Admitting: Nurse Practitioner

## 2023-10-12 ENCOUNTER — Other Ambulatory Visit: Payer: Self-pay | Admitting: Family Medicine

## 2023-10-12 DIAGNOSIS — G894 Chronic pain syndrome: Secondary | ICD-10-CM

## 2023-10-14 NOTE — Telephone Encounter (Signed)
Requested medication (s) are due for refill today: yes  Requested medication (s) are on the active medication list: yes  Last refill:  07/02/23  Future visit scheduled: no  Notes to clinic:  Unable to refill per protocol, cannot delegate.      Requested Prescriptions  Pending Prescriptions Disp Refills   pregabalin (LYRICA) 150 MG capsule [Pharmacy Med Name: PREGABALIN 150 MG CAPSULE] 90 capsule 2    Sig: TAKE 1 CAPSULE BY MOUTH THREE TIMES A DAY     Not Delegated - Neurology:  Anticonvulsants - Controlled - pregabalin Failed - 10/12/2023 10:59 AM      Failed - This refill cannot be delegated      Passed - Cr in normal range and within 360 days    Creatinine  Date Value Ref Range Status  02/17/2014 0.78 0.60 - 1.30 mg/dL Final   Creatinine, Ser  Date Value Ref Range Status  08/06/2023 0.83 0.57 - 1.00 mg/dL Final         Passed - Completed PHQ-2 or PHQ-9 in the last 360 days      Passed - Valid encounter within last 12 months    Recent Outpatient Visits           7 months ago Essential hypertension   [place] [facility] Smitty Cords, DO   1 year ago Adjustment disorder with mixed anxiety and depressed mood   [place] [facility] Smitty Cords, DO   1 year ago Abdominal bloating   [place] [facility] Smitty Cords, DO   1 year ago Fibromyalgia   [place] [facility] Smitty Cords, DO   2 years ago Upper respiratory tract infection, unspecified type   [place] [facility] [place], [name], DO

## 2023-10-15 NOTE — Telephone Encounter (Signed)
I believe this is your patient. Has a different PCP listed but you have seen this year.

## 2023-10-19 ENCOUNTER — Other Ambulatory Visit: Payer: Self-pay | Admitting: Family Medicine

## 2023-10-19 DIAGNOSIS — F411 Generalized anxiety disorder: Secondary | ICD-10-CM

## 2023-10-20 NOTE — Telephone Encounter (Signed)
Requested medications are due for refill today.  yes  Requested medications are on the active medications list.  yes  Last refill. 03/05/2023 #90 1 rf  Future visit scheduled.   no  Notes to clinic.  Maryclare Bean listed as pcp.    Requested Prescriptions  Pending Prescriptions Disp Refills   escitalopram (LEXAPRO) 10 MG tablet [Pharmacy Med Name: ESCITALOPRAM 10 MG TABLET] 90 tablet 1    Sig: TAKE 1 TABLET BY MOUTH EVERY DAY     Psychiatry:  Antidepressants - SSRI Failed - 10/19/2023  8:16 PM      Failed - Valid encounter within last 6 months    Recent Outpatient Visits           7 months ago Essential hypertension   [place] [facility] [place], [name], DO   1 year ago Adjustment disorder with mixed anxiety and depressed mood   [place] [facility] Smitty Cords, DO   1 year ago Abdominal bloating   [place] [facility] Smitty Cords, DO   1 year ago Fibromyalgia   [place] [facility] Smitty Cords, DO   2 years ago Upper respiratory tract infection, unspecified type   [place] [facility] [place], [name], DO              Passed - Completed PHQ-2 or PHQ-9 in the last 360 days

## 2023-11-01 ENCOUNTER — Other Ambulatory Visit: Payer: Self-pay | Admitting: Physician Assistant

## 2023-12-04 ENCOUNTER — Other Ambulatory Visit: Payer: Self-pay | Admitting: Family Medicine

## 2023-12-04 ENCOUNTER — Other Ambulatory Visit: Payer: Self-pay | Admitting: Nurse Practitioner

## 2023-12-04 DIAGNOSIS — K219 Gastro-esophageal reflux disease without esophagitis: Secondary | ICD-10-CM

## 2023-12-08 NOTE — Telephone Encounter (Signed)
 Requested medications are due for refill today.  yes  Requested medications are on the active medications list.  yes  Last refill. 06/02/2023  #90 1 rf  Future visit scheduled.   no  Notes to clinic.  [place] Phalen listed as PCP    Requested Prescriptions  Pending Prescriptions Disp Refills   pantoprazole  (PROTONIX ) 40 MG tablet [Pharmacy Med Name: PANTOPRAZOLE  SOD DR 40 MG TAB] 90 tablet 1    Sig: TAKE 1 TABLET BY MOUTH EVERY DAY BEFORE BREAKFAST     Gastroenterology: Proton Pump Inhibitors Passed - 12/08/2023  9:55 AM      Passed - Valid encounter within last 12 months    Recent Outpatient Visits           9 months ago Essential hypertension   [place] [facility] [place], Marsa PARAS, DO   1 year ago Adjustment disorder with mixed anxiety and depressed mood   [place] [facility] [place] Marsa PARAS, DO   1 year ago Abdominal bloating   [place] [facility] [place] Marsa PARAS, DO   1 year ago Fibromyalgia   [place] [facility] [place] Marsa PARAS, DO   2 years ago Upper respiratory tract infection, unspecified type   [place] [facility] [place], [name], DO

## 2023-12-08 NOTE — Telephone Encounter (Signed)
 Your patient, this was sent to me in error

## 2023-12-10 ENCOUNTER — Other Ambulatory Visit: Payer: Self-pay | Admitting: Family Medicine

## 2023-12-10 DIAGNOSIS — I10 Essential (primary) hypertension: Secondary | ICD-10-CM

## 2023-12-12 NOTE — Telephone Encounter (Signed)
 06/10/23- per notes- changed provider Requested Prescriptions  Pending Prescriptions Disp Refills   metoprolol  tartrate (LOPRESSOR ) 50 MG tablet [Pharmacy Med Name: METOPROLOL  TARTRATE 50 MG TAB] 180 tablet 1    Sig: TAKE 1 TABLET BY MOUTH TWICE A DAY     Cardiovascular:  Beta Blockers Failed - 12/12/2023  1:46 PM      Failed - Last BP in normal range    BP Readings from Last 1 Encounters:  08/06/23 (!) 142/98         Failed - Valid encounter within last 6 months    Recent Outpatient Visits           9 months ago Essential hypertension   [place] [facility] [place], Marsa PARAS, DO   1 year ago Adjustment disorder with mixed anxiety and depressed mood   [place] [facility] [place] Marsa PARAS, DO   1 year ago Abdominal bloating   [place] [facility] [place] Marsa PARAS, DO   1 year ago Fibromyalgia   [place] [facility] [place] Marsa PARAS, DO   2 years ago Upper respiratory tract infection, unspecified type    [facility] [place], [place] P, DO              Passed - Last Heart Rate in normal range    Pulse Readings from Last 1 Encounters:  08/06/23 66

## 2024-01-12 ENCOUNTER — Other Ambulatory Visit: Payer: Self-pay | Admitting: Family Medicine

## 2024-01-12 DIAGNOSIS — G894 Chronic pain syndrome: Secondary | ICD-10-CM

## 2024-01-13 NOTE — Telephone Encounter (Signed)
Requested medication (s) are due for refill today - yes  Requested medication (s) are on the active medication list -yes  Future visit scheduled -no  Last refill: 10/15/23 #90 2RF  Notes to clinic: non delegated Rx, new PCP- no longer under provider care  Requested Prescriptions  Pending Prescriptions Disp Refills   pregabalin (LYRICA) 150 MG capsule [Pharmacy Med Name: PREGABALIN 150 MG CAPSULE] 90 capsule     Sig: TAKE 1 CAPSULE BY MOUTH THREE TIMES A DAY     Not Delegated - Neurology:  Anticonvulsants - Controlled - pregabalin Failed - 01/13/2024  3:44 PM      Failed - This refill cannot be delegated      Passed - Cr in normal range and within 360 days    Creatinine  Date Value Ref Range Status  02/17/2014 0.78 0.60 - 1.30 mg/dL Final   Creatinine, Ser  Date Value Ref Range Status  08/06/2023 0.83 0.57 - 1.00 mg/dL Final         Passed - Completed PHQ-2 or PHQ-9 in the last 360 days      Passed - Valid encounter within last 12 months    Recent Outpatient Visits           10 months ago Essential hypertension   [place] [facility] Smitty Cords, DO   1 year ago Adjustment disorder with mixed anxiety and depressed mood   [place] [facility] [place], [name], DO   1 year ago Abdominal bloating   [place] [facility] Smitty Cords, DO   1 year ago Fibromyalgia   [place] Smitty Cords, DO   2 years ago Upper respiratory tract infection, unspecified type   [place] [facility] [place], [place] P, DO                 Requested Prescriptions  Pending Prescriptions Disp Refills   pregabalin (LYRICA) 150 MG capsule [Pharmacy Med Name: PREGABALIN 150 MG CAPSULE] 90 capsule     Sig: TAKE 1 CAPSULE BY MOUTH THREE TIMES A DAY     Not Delegated - Neurology:  Anticonvulsants - Controlled - pregabalin Failed - 01/13/2024  3:44 PM       Failed - This refill cannot be delegated      Passed - Cr in normal range and within 360 days    Creatinine  Date Value Ref Range Status  02/17/2014 0.78 0.60 - 1.30 mg/dL Final   Creatinine, Ser  Date Value Ref Range Status  08/06/2023 0.83 0.57 - 1.00 mg/dL Final         Passed - Completed PHQ-2 or PHQ-9 in the last 360 days      Passed - Valid encounter within last 12 months    Recent Outpatient Visits           10 months ago Essential hypertension   [place] [facility] Smitty Cords, DO   1 year ago Adjustment disorder with mixed anxiety and depressed mood   [place] [facility] Smitty Cords, DO   1 year ago Abdominal bloating   [place] [facility] Smitty Cords, DO   1 year ago Fibromyalgia   [place] [facility] Smitty Cords, DO   2 years ago Upper respiratory tract infection, unspecified type   [place] [facility] [place], [place]  P, DO

## 2024-03-13 ENCOUNTER — Other Ambulatory Visit: Payer: Self-pay | Admitting: Cardiovascular Disease

## 2024-03-13 ENCOUNTER — Other Ambulatory Visit: Payer: Self-pay | Admitting: Family Medicine

## 2024-03-13 DIAGNOSIS — K219 Gastro-esophageal reflux disease without esophagitis: Secondary | ICD-10-CM

## 2024-03-15 NOTE — Telephone Encounter (Signed)
 Patient no longer under prescriber care.  Requested Prescriptions  Pending Prescriptions Disp Refills   pantoprazole (PROTONIX) 40 MG tablet [Pharmacy Med Name: PANTOPRAZOLE SOD DR 40 MG TAB] 90 tablet 0    Sig: TAKE 1 TABLET BY MOUTH EVERY DAY BEFORE BREAKFAST     Gastroenterology: Proton Pump Inhibitors Failed - 03/15/2024 11:36 AM      Failed - Valid encounter within last 12 months    Recent Outpatient Visits   None

## 2024-05-31 ENCOUNTER — Other Ambulatory Visit: Payer: Self-pay | Admitting: Internal Medicine

## 2024-05-31 DIAGNOSIS — Z1231 Encounter for screening mammogram for malignant neoplasm of breast: Secondary | ICD-10-CM

## 2024-06-11 ENCOUNTER — Other Ambulatory Visit: Payer: Self-pay | Admitting: Cardiovascular Disease

## 2024-07-06 ENCOUNTER — Inpatient Hospital Stay: Admission: RE | Admit: 2024-07-06 | Source: Ambulatory Visit

## 2024-07-28 ENCOUNTER — Other Ambulatory Visit: Payer: Self-pay | Admitting: Family Medicine

## 2024-07-28 DIAGNOSIS — A6000 Herpesviral infection of urogenital system, unspecified: Secondary | ICD-10-CM

## 2024-07-30 NOTE — Telephone Encounter (Signed)
 PCP no longer listed as primary care provider Requested Prescriptions  Pending Prescriptions Disp Refills   valACYclovir  (VALTREX ) 1000 MG tablet [Pharmacy Med Name: VALACYCLOVIR  HCL 1 GRAM TABLET] 20 tablet 3    Sig: TAKE 1 TABLET BY MOUTH 2 TIMES DAILY. 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

## 2024-08-06 ENCOUNTER — Other Ambulatory Visit: Payer: Self-pay | Admitting: Family Medicine

## 2024-08-06 DIAGNOSIS — F5104 Psychophysiologic insomnia: Secondary | ICD-10-CM

## 2024-08-06 NOTE — Telephone Encounter (Signed)
 Requested medication (s) are due for refill today: yes  Requested medication (s) are on the active medication list: yes  Last refill:  08/07/22  Future visit scheduled: no  Notes to clinic:  another provider listed a PCP, routing for review      Requested Prescriptions  Pending Prescriptions Disp Refills   traZODone  (DESYREL ) 50 MG tablet [Pharmacy Med Name: TRAZODONE  50 MG TABLET] 90 tablet 1    Sig: TAKE 1/2 TO 1 TABLET (25 TO 50 MG TOTAL) BY MOUTH AT BEDTIME     Psychiatry: Antidepressants - Serotonin Modulator Failed - 08/06/2024  3:36 PM      Failed - Completed PHQ-2 or PHQ-9 in the last 360 days      Failed - Valid encounter within last 6 months    Recent Outpatient Visits   None
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
## 2024-09-09 ENCOUNTER — Other Ambulatory Visit: Payer: Self-pay | Admitting: Cardiovascular Disease

## 2024-09-09 ENCOUNTER — Encounter: Payer: Self-pay | Admitting: Physician Assistant

## 2024-09-09 ENCOUNTER — Ambulatory Visit: Attending: Physician Assistant | Admitting: Physician Assistant

## 2024-09-09 VITALS — BP 97/60 | HR 62 | Ht 67.0 in | Wt 207.2 lb

## 2024-09-09 DIAGNOSIS — E785 Hyperlipidemia, unspecified: Secondary | ICD-10-CM

## 2024-09-09 DIAGNOSIS — Z79899 Other long term (current) drug therapy: Secondary | ICD-10-CM | POA: Diagnosis not present

## 2024-09-09 DIAGNOSIS — M25473 Effusion, unspecified ankle: Secondary | ICD-10-CM

## 2024-09-09 DIAGNOSIS — I2510 Atherosclerotic heart disease of native coronary artery without angina pectoris: Secondary | ICD-10-CM

## 2024-09-09 DIAGNOSIS — I10 Essential (primary) hypertension: Secondary | ICD-10-CM | POA: Diagnosis not present

## 2024-09-09 MED ORDER — HYDROCHLOROTHIAZIDE 12.5 MG PO CAPS: 12.5000 mg | ORAL_CAPSULE | Freq: Every day | ORAL | 3 refills | Status: AC | PRN

## 2024-09-09 MED ORDER — POTASSIUM CHLORIDE ER 10 MEQ PO TBCR: 10.0000 meq | EXTENDED_RELEASE_TABLET | Freq: Every day | ORAL | 3 refills | Status: AC | PRN

## 2024-09-09 NOTE — Patient Instructions (Signed)
 Medication Instructions:  Your physician recommends that you continue on your current medications as directed. Please refer to the Current Medication list given to you today.   *If you need a refill on your cardiac medications before your next appointment, please call your pharmacy*  Lab Work: Your provider would like for you to have following labs drawn today CMeT, Lipid, and Direct LDL.   If you have labs (blood work) drawn today and your tests are completely normal, you will receive your results only by: MyChart Message (if you have MyChart) OR A paper copy in the mail If you have any lab test that is abnormal or we need to change your treatment, we will call you to review the results.  Follow-Up: At [facility], you and your health needs are our priority.  As part of our continuing mission to provide you with exceptional heart care, our providers are all part of one team.  This team includes your primary Cardiologist (physician) and Advanced Practice Providers or APPs (Physician Assistants and Nurse Practitioners) who all work together to provide you with the care you need, when you need it.  Your next appointment:   1 year(s)  Provider:   You may see Timothy Gollan, MD or Bernardino Bring, PA-C

## 2024-09-10 ENCOUNTER — Ambulatory Visit: Payer: Self-pay | Admitting: Physician Assistant

## 2024-09-10 LAB — COMPREHENSIVE METABOLIC PANEL WITH GFR
ALT: 24 IU/L (ref 0–32)
AST: 21 IU/L (ref 0–40)
Albumin: 4.1 g/dL (ref 3.8–4.9)
Alkaline Phosphatase: 93 IU/L (ref 49–135)
BUN/Creatinine Ratio: 29 — ABNORMAL HIGH (ref 9–23)
BUN: 29 mg/dL — ABNORMAL HIGH (ref 6–24)
Bilirubin Total: 0.3 mg/dL (ref 0.0–1.2)
CO2: 20 mmol/L (ref 20–29)
Calcium: 9 mg/dL (ref 8.7–10.2)
Chloride: 101 mmol/L (ref 96–106)
Creatinine, Ser: 1 mg/dL (ref 0.57–1.00)
Globulin, Total: 2.8 g/dL (ref 1.5–4.5)
Glucose: 82 mg/dL (ref 70–99)
Potassium: 4.2 mmol/L (ref 3.5–5.2)
Sodium: 137 mmol/L (ref 134–144)
Total Protein: 6.9 g/dL (ref 6.0–8.5)
eGFR: 68 mL/min/1.73 (ref >59)

## 2024-09-10 LAB — LIPID PANEL
Chol/HDL Ratio: 2.6 ratio (ref 0.0–4.4)
Cholesterol, Total: 116 mg/dL (ref 100–199)
HDL: 45 mg/dL (ref >39)
LDL Chol Calc (NIH): 53 mg/dL (ref 0–99)
Triglycerides: 92 mg/dL (ref 0–149)
VLDL Cholesterol Cal: 18 mg/dL (ref 5–40)

## 2024-09-10 LAB — LDL CHOLESTEROL, DIRECT: LDL Direct: 52 mg/dL (ref 0–99)

## 2024-10-01 ENCOUNTER — Other Ambulatory Visit: Payer: Self-pay | Admitting: Family Medicine

## 2024-10-01 DIAGNOSIS — I2510 Atherosclerotic heart disease of native coronary artery without angina pectoris: Secondary | ICD-10-CM

## 2024-10-02 NOTE — Telephone Encounter (Signed)
 No longer under care of Dr. Edman. Will refuse this request due to this.  Requested Prescriptions  Pending Prescriptions Disp Refills   nitroGLYCERIN  (NITROSTAT ) 0.4 MG SL tablet [Pharmacy Med Name: NITROGLYCERIN  0.4 MG TABLET SL] 25 tablet 5    Sig: TAKE 1 TABLET BY MOUTH UNDER TONGUE EVERY 5 MIN AS NEEDED FOR CHEST PAIN     Cardiovascular:  Nitrates Failed - 10/02/2024  9:29 AM      Failed - Valid encounter within last 12 months    Recent Outpatient Visits   None            Passed - Last BP in normal range    BP Readings from Last 1 Encounters:  09/09/24 97/60         Passed - Last Heart Rate in normal range    Pulse Readings from Last 1 Encounters:  09/09/24 62

## 2024-10-18 ENCOUNTER — Ambulatory Visit: Admitting: Cardiovascular Disease

## 2024-10-29 ENCOUNTER — Other Ambulatory Visit: Payer: Self-pay | Admitting: Family Medicine

## 2024-10-29 DIAGNOSIS — I2510 Atherosclerotic heart disease of native coronary artery without angina pectoris: Secondary | ICD-10-CM

## 2024-11-02 NOTE — Telephone Encounter (Signed)
 Unable to refill per protocol, appointment needed.   Requested Prescriptions  Pending Prescriptions Disp Refills   nitroGLYCERIN  (NITROSTAT ) 0.4 MG SL tablet [Pharmacy Med Name: NITROGLYCERIN  0.4 MG TABLET SL] 25 tablet 5    Sig: TAKE 1 TABLET BY MOUTH UNDER TONGUE EVERY 5 MIN AS NEEDED FOR CHEST PAIN     Cardiovascular:  Nitrates Failed - 11/02/2024  4:19 PM      Failed - Valid encounter within last 12 months    Recent Outpatient Visits   None            Passed - Last BP in normal range    BP Readings from Last 1 Encounters:  09/09/24 97/60         Passed - Last Heart Rate in normal range    Pulse Readings from Last 1 Encounters:  09/09/24 62

## 2024-12-14 ENCOUNTER — Other Ambulatory Visit: Payer: Self-pay | Admitting: Cardiovascular Disease
